# Patient Record
Sex: Male | Born: 1955 | Race: White | Hispanic: No | Marital: Single | Smoking: Former smoker
Health system: Southern US, Community
[De-identification: ages and names within clinical notes are randomized; demographics above are authoritative.]

## PROBLEM LIST (undated history)

## (undated) DIAGNOSIS — E785 Hyperlipidemia, unspecified: Secondary | ICD-10-CM

## (undated) DIAGNOSIS — L309 Dermatitis, unspecified: Secondary | ICD-10-CM

## (undated) DIAGNOSIS — D693 Immune thrombocytopenic purpura: Principal | ICD-10-CM

## (undated) DIAGNOSIS — N052 Unspecified nephritic syndrome with diffuse membranous glomerulonephritis: Secondary | ICD-10-CM

## (undated) DIAGNOSIS — N189 Chronic kidney disease, unspecified: Secondary | ICD-10-CM

## (undated) DIAGNOSIS — I10 Essential (primary) hypertension: Secondary | ICD-10-CM

## (undated) DIAGNOSIS — D649 Anemia, unspecified: Secondary | ICD-10-CM

## (undated) HISTORY — DX: Anemia, unspecified: D64.9

## (undated) HISTORY — DX: Hyperlipidemia, unspecified: E78.5

## (undated) HISTORY — DX: Chronic kidney disease, unspecified: N18.9

## (undated) HISTORY — PX: HERNIA REPAIR: SHX51

## (undated) HISTORY — DX: Essential (primary) hypertension: I10

## (undated) HISTORY — DX: Unspecified nephritic syndrome with diffuse membranous glomerulonephritis: N05.2

## (undated) HISTORY — PX: OTHER SURGICAL HISTORY: SHX169

## (undated) HISTORY — DX: Immune thrombocytopenic purpura: D69.3

---

## 2005-04-26 ENCOUNTER — Ambulatory Visit (HOSPITAL_BASED_OUTPATIENT_CLINIC_OR_DEPARTMENT_OTHER): Admission: RE | Admit: 2005-04-26 | Discharge: 2005-04-26 | Payer: Self-pay | Admitting: Surgery

## 2005-04-26 ENCOUNTER — Ambulatory Visit (HOSPITAL_COMMUNITY): Admission: RE | Admit: 2005-04-26 | Discharge: 2005-04-26 | Payer: Self-pay | Admitting: Surgery

## 2005-12-20 ENCOUNTER — Ambulatory Visit (HOSPITAL_COMMUNITY): Admission: RE | Admit: 2005-12-20 | Discharge: 2005-12-20 | Payer: Self-pay | Admitting: Surgery

## 2009-10-03 DIAGNOSIS — D693 Immune thrombocytopenic purpura: Secondary | ICD-10-CM

## 2009-10-03 HISTORY — DX: Immune thrombocytopenic purpura: D69.3

## 2010-12-01 ENCOUNTER — Other Ambulatory Visit: Payer: Self-pay | Admitting: Oncology

## 2010-12-01 ENCOUNTER — Encounter (HOSPITAL_BASED_OUTPATIENT_CLINIC_OR_DEPARTMENT_OTHER): Payer: BC Managed Care – PPO | Admitting: Oncology

## 2010-12-01 ENCOUNTER — Encounter: Payer: Self-pay | Admitting: Oncology

## 2010-12-01 DIAGNOSIS — D696 Thrombocytopenia, unspecified: Secondary | ICD-10-CM

## 2010-12-01 DIAGNOSIS — N039 Chronic nephritic syndrome with unspecified morphologic changes: Secondary | ICD-10-CM

## 2010-12-01 LAB — CBC WITH DIFFERENTIAL/PLATELET
BASO%: 0.7 % (ref 0.0–2.0)
EOS%: 0.5 % (ref 0.0–7.0)
MCH: 30.7 pg (ref 27.2–33.4)
MCHC: 34.5 g/dL (ref 32.0–36.0)
MCV: 89.1 fL (ref 79.3–98.0)
MONO%: 4.1 % (ref 0.0–14.0)
NEUT#: 4.3 10*3/uL (ref 1.5–6.5)
RBC: 3.98 10*6/uL — ABNORMAL LOW (ref 4.20–5.82)
RDW: 13.2 % (ref 11.0–14.6)

## 2010-12-01 LAB — CHCC SMEAR

## 2010-12-01 LAB — MORPHOLOGY: RBC Comments: NORMAL

## 2010-12-03 LAB — COMPREHENSIVE METABOLIC PANEL
ALT: 12 U/L (ref 0–53)
Albumin: 4 g/dL (ref 3.5–5.2)
Alkaline Phosphatase: 30 U/L — ABNORMAL LOW (ref 39–117)
CO2: 25 mEq/L (ref 19–32)
Potassium: 4.2 mEq/L (ref 3.5–5.3)
Sodium: 140 mEq/L (ref 135–145)
Total Bilirubin: 0.9 mg/dL (ref 0.3–1.2)
Total Protein: 6.2 g/dL (ref 6.0–8.3)

## 2010-12-03 LAB — FOLATE: Folate: >20 ng/mL

## 2010-12-03 LAB — PROTEIN ELECTROPHORESIS, SERUM
Beta 2: 4.7 % (ref 3.2–6.5)
Gamma Globulin: 11.6 % (ref 11.1–18.8)

## 2010-12-03 LAB — VITAMIN B12: Vitamin B-12: 387 pg/mL (ref 211–911)

## 2010-12-03 LAB — KAPPA/LAMBDA LIGHT CHAINS: Kappa free light chain: 1.13 mg/dL (ref 0.33–1.94)

## 2010-12-03 LAB — HIV ANTIBODY (ROUTINE TESTING W REFLEX): HIV: NONREACTIVE

## 2010-12-06 ENCOUNTER — Other Ambulatory Visit (HOSPITAL_COMMUNITY)
Admission: RE | Admit: 2010-12-06 | Discharge: 2010-12-06 | Disposition: A | Discharge status: Home / Self Care | Payer: BC Managed Care – PPO | Source: Ambulatory Visit | Attending: Oncology | Admitting: Oncology

## 2010-12-06 ENCOUNTER — Other Ambulatory Visit: Payer: Self-pay | Admitting: Oncology

## 2010-12-06 ENCOUNTER — Encounter (HOSPITAL_BASED_OUTPATIENT_CLINIC_OR_DEPARTMENT_OTHER): Payer: BC Managed Care – PPO | Admitting: Oncology

## 2010-12-06 DIAGNOSIS — D696 Thrombocytopenia, unspecified: Secondary | ICD-10-CM | POA: Insufficient documentation

## 2010-12-06 DIAGNOSIS — N039 Chronic nephritic syndrome with unspecified morphologic changes: Secondary | ICD-10-CM

## 2010-12-06 DIAGNOSIS — D649 Anemia, unspecified: Secondary | ICD-10-CM

## 2010-12-06 LAB — CBC WITH DIFFERENTIAL/PLATELET
Eosinophils Absolute: 0 10*3/uL (ref 0.0–0.5)
MONO#: 0.3 10*3/uL (ref 0.1–0.9)
MONO%: 4.2 % (ref 0.0–14.0)
NEUT#: 6.3 10*3/uL (ref 1.5–6.5)
RBC: 4.12 10*6/uL — ABNORMAL LOW (ref 4.20–5.82)
RDW: 13.4 % (ref 11.0–14.6)
WBC: 7.3 10*3/uL (ref 4.0–10.3)
lymph#: 0.7 10*3/uL — ABNORMAL LOW (ref 0.9–3.3)

## 2010-12-06 LAB — WHOLE BLOOD GLUCOSE
Glucose: 106 mg/dL — ABNORMAL HIGH (ref 70–100)
HRS PC: 2 Hours

## 2010-12-09 ENCOUNTER — Ambulatory Visit (HOSPITAL_COMMUNITY)
Admission: RE | Admit: 2010-12-09 | Discharge: 2010-12-09 | Disposition: A | Discharge status: Home / Self Care | Payer: BC Managed Care – PPO | Source: Ambulatory Visit | Attending: Oncology | Admitting: Oncology

## 2010-12-09 DIAGNOSIS — N049 Nephrotic syndrome with unspecified morphologic changes: Secondary | ICD-10-CM | POA: Insufficient documentation

## 2010-12-09 DIAGNOSIS — R161 Splenomegaly, not elsewhere classified: Secondary | ICD-10-CM | POA: Insufficient documentation

## 2010-12-09 DIAGNOSIS — D1803 Hemangioma of intra-abdominal structures: Secondary | ICD-10-CM | POA: Insufficient documentation

## 2010-12-09 DIAGNOSIS — I10 Essential (primary) hypertension: Secondary | ICD-10-CM | POA: Insufficient documentation

## 2010-12-09 DIAGNOSIS — D696 Thrombocytopenia, unspecified: Secondary | ICD-10-CM | POA: Insufficient documentation

## 2010-12-14 ENCOUNTER — Other Ambulatory Visit: Payer: Self-pay | Admitting: Oncology

## 2010-12-14 ENCOUNTER — Encounter (HOSPITAL_BASED_OUTPATIENT_CLINIC_OR_DEPARTMENT_OTHER): Payer: BC Managed Care – PPO | Admitting: Oncology

## 2010-12-14 DIAGNOSIS — D696 Thrombocytopenia, unspecified: Secondary | ICD-10-CM

## 2010-12-14 DIAGNOSIS — N039 Chronic nephritic syndrome with unspecified morphologic changes: Secondary | ICD-10-CM

## 2010-12-14 LAB — CBC WITH DIFFERENTIAL/PLATELET
EOS%: 0 % (ref 0.0–7.0)
Eosinophils Absolute: 0 10*3/uL (ref 0.0–0.5)
LYMPH%: 5.7 % — ABNORMAL LOW (ref 14.0–49.0)
MCH: 30.8 pg (ref 27.2–33.4)
MCV: 89.8 fL (ref 79.3–98.0)
MONO%: 2.4 % (ref 0.0–14.0)
NEUT#: 7.3 10*3/uL — ABNORMAL HIGH (ref 1.5–6.5)
Platelets: 163 10*3/uL (ref 140–400)
RBC: 4.14 10*6/uL — ABNORMAL LOW (ref 4.20–5.82)

## 2010-12-28 ENCOUNTER — Encounter (HOSPITAL_BASED_OUTPATIENT_CLINIC_OR_DEPARTMENT_OTHER): Payer: BC Managed Care – PPO | Admitting: Oncology

## 2010-12-28 ENCOUNTER — Other Ambulatory Visit: Payer: Self-pay | Admitting: Oncology

## 2010-12-28 DIAGNOSIS — D696 Thrombocytopenia, unspecified: Secondary | ICD-10-CM

## 2010-12-28 DIAGNOSIS — N039 Chronic nephritic syndrome with unspecified morphologic changes: Secondary | ICD-10-CM

## 2010-12-28 LAB — CBC WITH DIFFERENTIAL/PLATELET
BASO%: 0.1 % (ref 0.0–2.0)
LYMPH%: 10 % — ABNORMAL LOW (ref 14.0–49.0)
MCHC: 34.4 g/dL (ref 32.0–36.0)
MCV: 87.7 fL (ref 79.3–98.0)
MONO#: 0.4 10*3/uL (ref 0.1–0.9)
MONO%: 4.9 % (ref 0.0–14.0)
Platelets: 78 10*3/uL — ABNORMAL LOW (ref 140–400)
RBC: 4.14 10*6/uL — ABNORMAL LOW (ref 4.20–5.82)
WBC: 8 10*3/uL (ref 4.0–10.3)

## 2011-01-04 ENCOUNTER — Other Ambulatory Visit: Payer: Self-pay | Admitting: Oncology

## 2011-01-04 ENCOUNTER — Encounter (HOSPITAL_BASED_OUTPATIENT_CLINIC_OR_DEPARTMENT_OTHER): Payer: BC Managed Care – PPO | Admitting: Oncology

## 2011-01-04 DIAGNOSIS — D696 Thrombocytopenia, unspecified: Secondary | ICD-10-CM

## 2011-01-04 LAB — CBC WITH DIFFERENTIAL/PLATELET
BASO%: 0 % (ref 0.0–2.0)
MCHC: 34.7 g/dL (ref 32.0–36.0)
MONO#: 0.1 10*3/uL (ref 0.1–0.9)
RBC: 4.41 10*6/uL (ref 4.20–5.82)
WBC: 7.9 10*3/uL (ref 4.0–10.3)
lymph#: 0.3 10*3/uL — ABNORMAL LOW (ref 0.9–3.3)
nRBC: 0 % (ref 0–0)

## 2011-01-04 LAB — BASIC METABOLIC PANEL
Chloride: 104 mEq/L (ref 96–112)
Potassium: 4.3 mEq/L (ref 3.5–5.3)

## 2011-01-11 ENCOUNTER — Other Ambulatory Visit: Payer: Self-pay | Admitting: Oncology

## 2011-01-11 ENCOUNTER — Encounter (HOSPITAL_BASED_OUTPATIENT_CLINIC_OR_DEPARTMENT_OTHER): Payer: BC Managed Care – PPO | Admitting: Oncology

## 2011-01-11 DIAGNOSIS — D696 Thrombocytopenia, unspecified: Secondary | ICD-10-CM

## 2011-01-11 DIAGNOSIS — D649 Anemia, unspecified: Secondary | ICD-10-CM

## 2011-01-11 LAB — CBC WITH DIFFERENTIAL/PLATELET
Basophils Absolute: 0 10*3/uL (ref 0.0–0.1)
Eosinophils Absolute: 0 10*3/uL (ref 0.0–0.5)
HGB: 12.4 g/dL — ABNORMAL LOW (ref 13.0–17.1)
LYMPH%: 5.8 % — ABNORMAL LOW (ref 14.0–49.0)
MCV: 90.9 fL (ref 79.3–98.0)
MONO%: 3.9 % (ref 0.0–14.0)
NEUT#: 7.2 10*3/uL — ABNORMAL HIGH (ref 1.5–6.5)
Platelets: 131 10*3/uL — ABNORMAL LOW (ref 140–400)
RDW: 13.7 % (ref 11.0–14.6)

## 2011-01-18 ENCOUNTER — Other Ambulatory Visit: Payer: Self-pay | Admitting: Oncology

## 2011-01-18 ENCOUNTER — Encounter (HOSPITAL_BASED_OUTPATIENT_CLINIC_OR_DEPARTMENT_OTHER): Payer: BC Managed Care – PPO | Admitting: Oncology

## 2011-01-18 DIAGNOSIS — D649 Anemia, unspecified: Secondary | ICD-10-CM

## 2011-01-18 DIAGNOSIS — D696 Thrombocytopenia, unspecified: Secondary | ICD-10-CM

## 2011-01-18 LAB — CBC WITH DIFFERENTIAL/PLATELET
Basophils Absolute: 0 10*3/uL (ref 0.0–0.1)
Eosinophils Absolute: 0 10*3/uL (ref 0.0–0.5)
HCT: 36.5 % — ABNORMAL LOW (ref 38.4–49.9)
LYMPH%: 4.5 % — ABNORMAL LOW (ref 14.0–49.0)
MCV: 91.3 fL (ref 79.3–98.0)
MONO%: 4 % (ref 0.0–14.0)
NEUT#: 7.5 10*3/uL — ABNORMAL HIGH (ref 1.5–6.5)
NEUT%: 91.5 % — ABNORMAL HIGH (ref 39.0–75.0)
Platelets: 142 10*3/uL (ref 140–400)
RBC: 4 10*6/uL — ABNORMAL LOW (ref 4.20–5.82)

## 2011-01-25 ENCOUNTER — Encounter (HOSPITAL_BASED_OUTPATIENT_CLINIC_OR_DEPARTMENT_OTHER): Payer: BC Managed Care – PPO | Admitting: Oncology

## 2011-01-25 ENCOUNTER — Other Ambulatory Visit: Payer: Self-pay | Admitting: Oncology

## 2011-01-25 DIAGNOSIS — D696 Thrombocytopenia, unspecified: Secondary | ICD-10-CM

## 2011-01-25 DIAGNOSIS — N039 Chronic nephritic syndrome with unspecified morphologic changes: Secondary | ICD-10-CM

## 2011-01-25 LAB — CBC WITH DIFFERENTIAL/PLATELET
BASO%: 0.2 % (ref 0.0–2.0)
EOS%: 0 % (ref 0.0–7.0)
LYMPH%: 6.4 % — ABNORMAL LOW (ref 14.0–49.0)
MCH: 31.1 pg (ref 27.2–33.4)
MCHC: 34.1 g/dL (ref 32.0–36.0)
MCV: 91.2 fL (ref 79.3–98.0)
MONO%: 4.7 % (ref 0.0–14.0)
NEUT%: 88.7 % — ABNORMAL HIGH (ref 39.0–75.0)
Platelets: 117 10*3/uL — ABNORMAL LOW (ref 140–400)
RBC: 3.99 10*6/uL — ABNORMAL LOW (ref 4.20–5.82)
WBC: 7.7 10*3/uL (ref 4.0–10.3)

## 2011-02-02 ENCOUNTER — Encounter (HOSPITAL_BASED_OUTPATIENT_CLINIC_OR_DEPARTMENT_OTHER): Payer: BC Managed Care – PPO | Admitting: Oncology

## 2011-02-02 ENCOUNTER — Other Ambulatory Visit: Payer: Self-pay | Admitting: Oncology

## 2011-02-02 DIAGNOSIS — D696 Thrombocytopenia, unspecified: Secondary | ICD-10-CM

## 2011-02-02 DIAGNOSIS — D649 Anemia, unspecified: Secondary | ICD-10-CM

## 2011-02-02 LAB — CBC WITH DIFFERENTIAL/PLATELET
BASO%: 0 % (ref 0.0–2.0)
EOS%: 0 % (ref 0.0–7.0)
MCH: 31.3 pg (ref 27.2–33.4)
MCHC: 34.4 g/dL (ref 32.0–36.0)
MONO#: 0.3 10*3/uL (ref 0.1–0.9)
RBC: 4.03 10*6/uL — ABNORMAL LOW (ref 4.20–5.82)
WBC: 7.5 10*3/uL (ref 4.0–10.3)
lymph#: 0.4 10*3/uL — ABNORMAL LOW (ref 0.9–3.3)

## 2011-02-15 ENCOUNTER — Encounter (HOSPITAL_BASED_OUTPATIENT_CLINIC_OR_DEPARTMENT_OTHER): Payer: BC Managed Care – PPO | Admitting: Oncology

## 2011-02-15 ENCOUNTER — Other Ambulatory Visit: Payer: Self-pay | Admitting: Oncology

## 2011-02-15 DIAGNOSIS — D696 Thrombocytopenia, unspecified: Secondary | ICD-10-CM

## 2011-02-15 DIAGNOSIS — N039 Chronic nephritic syndrome with unspecified morphologic changes: Secondary | ICD-10-CM

## 2011-02-15 LAB — CBC WITH DIFFERENTIAL/PLATELET
BASO%: 0 % (ref 0.0–2.0)
Basophils Absolute: 0 10*3/uL (ref 0.0–0.1)
EOS%: 0.1 % (ref 0.0–7.0)
HCT: 35.5 % — ABNORMAL LOW (ref 38.4–49.9)
HGB: 12.2 g/dL — ABNORMAL LOW (ref 13.0–17.1)
MCH: 31.2 pg (ref 27.2–33.4)
MCHC: 34.4 g/dL (ref 32.0–36.0)
MONO#: 0.4 10*3/uL (ref 0.1–0.9)
NEUT%: 89.8 % — ABNORMAL HIGH (ref 39.0–75.0)
RDW: 14.4 % (ref 11.0–14.6)
WBC: 8.7 10*3/uL (ref 4.0–10.3)
lymph#: 0.5 10*3/uL — ABNORMAL LOW (ref 0.9–3.3)

## 2011-03-02 ENCOUNTER — Encounter (HOSPITAL_BASED_OUTPATIENT_CLINIC_OR_DEPARTMENT_OTHER): Payer: BC Managed Care – PPO | Admitting: Oncology

## 2011-03-02 ENCOUNTER — Other Ambulatory Visit: Payer: Self-pay | Admitting: Oncology

## 2011-03-02 DIAGNOSIS — D696 Thrombocytopenia, unspecified: Secondary | ICD-10-CM

## 2011-03-02 DIAGNOSIS — D649 Anemia, unspecified: Secondary | ICD-10-CM

## 2011-03-02 LAB — CBC WITH DIFFERENTIAL/PLATELET
Basophils Absolute: 0 10*3/uL (ref 0.0–0.1)
Eosinophils Absolute: 0 10*3/uL (ref 0.0–0.5)
HCT: 35.8 % — ABNORMAL LOW (ref 38.4–49.9)
HGB: 12.4 g/dL — ABNORMAL LOW (ref 13.0–17.1)
MCH: 31.7 pg (ref 27.2–33.4)
MONO#: 0.4 10*3/uL (ref 0.1–0.9)
NEUT#: 6.4 10*3/uL (ref 1.5–6.5)
NEUT%: 86.8 % — ABNORMAL HIGH (ref 39.0–75.0)
RDW: 14.3 % (ref 11.0–14.6)
lymph#: 0.6 10*3/uL — ABNORMAL LOW (ref 0.9–3.3)

## 2011-03-15 ENCOUNTER — Other Ambulatory Visit: Payer: Self-pay | Admitting: Oncology

## 2011-03-15 ENCOUNTER — Encounter (HOSPITAL_BASED_OUTPATIENT_CLINIC_OR_DEPARTMENT_OTHER): Payer: BC Managed Care – PPO | Admitting: Oncology

## 2011-03-15 DIAGNOSIS — D649 Anemia, unspecified: Secondary | ICD-10-CM

## 2011-03-15 DIAGNOSIS — D696 Thrombocytopenia, unspecified: Secondary | ICD-10-CM

## 2011-03-15 DIAGNOSIS — D693 Immune thrombocytopenic purpura: Secondary | ICD-10-CM

## 2011-03-15 LAB — COMPREHENSIVE METABOLIC PANEL
AST: 11 U/L (ref 0–37)
Albumin: 3.2 g/dL — ABNORMAL LOW (ref 3.5–5.2)
Alkaline Phosphatase: 25 U/L — ABNORMAL LOW (ref 39–117)
BUN: 33 mg/dL — ABNORMAL HIGH (ref 6–23)
Creatinine, Ser: 0.98 mg/dL (ref 0.50–1.35)
Glucose, Bld: 102 mg/dL — ABNORMAL HIGH (ref 70–99)
Potassium: 4.5 mEq/L (ref 3.5–5.3)

## 2011-03-15 LAB — CBC WITH DIFFERENTIAL/PLATELET
Basophils Absolute: 0 10*3/uL (ref 0.0–0.1)
EOS%: 0.2 % (ref 0.0–7.0)
Eosinophils Absolute: 0 10*3/uL (ref 0.0–0.5)
HCT: 35.1 % — ABNORMAL LOW (ref 38.4–49.9)
HGB: 12.1 g/dL — ABNORMAL LOW (ref 13.0–17.1)
LYMPH%: 10.2 % — ABNORMAL LOW (ref 14.0–49.0)
MCH: 31.3 pg (ref 27.2–33.4)
MCV: 90.7 fL (ref 79.3–98.0)
MONO%: 4.9 % (ref 0.0–14.0)
NEUT#: 5.6 10*3/uL (ref 1.5–6.5)
NEUT%: 84.4 % — ABNORMAL HIGH (ref 39.0–75.0)
Platelets: 150 10*3/uL (ref 140–400)
RDW: 14.1 % (ref 11.0–14.6)

## 2011-03-31 ENCOUNTER — Encounter (HOSPITAL_BASED_OUTPATIENT_CLINIC_OR_DEPARTMENT_OTHER): Payer: BC Managed Care – PPO | Admitting: Oncology

## 2011-03-31 ENCOUNTER — Other Ambulatory Visit: Payer: Self-pay | Admitting: Medical

## 2011-03-31 DIAGNOSIS — D649 Anemia, unspecified: Secondary | ICD-10-CM

## 2011-03-31 DIAGNOSIS — D696 Thrombocytopenia, unspecified: Secondary | ICD-10-CM

## 2011-03-31 LAB — CBC WITH DIFFERENTIAL/PLATELET
Basophils Absolute: 0 10*3/uL (ref 0.0–0.1)
Eosinophils Absolute: 0 10*3/uL (ref 0.0–0.5)
HCT: 37.6 % — ABNORMAL LOW (ref 38.4–49.9)
HGB: 13 g/dL (ref 13.0–17.1)
MCV: 91.3 fL (ref 79.3–98.0)
MONO%: 3.5 % (ref 0.0–14.0)
NEUT#: 6.4 10*3/uL (ref 1.5–6.5)
NEUT%: 89.5 % — ABNORMAL HIGH (ref 39.0–75.0)
RDW: 13.5 % (ref 11.0–14.6)
lymph#: 0.5 10*3/uL — ABNORMAL LOW (ref 0.9–3.3)

## 2011-04-14 ENCOUNTER — Encounter (HOSPITAL_BASED_OUTPATIENT_CLINIC_OR_DEPARTMENT_OTHER): Payer: BC Managed Care – PPO | Admitting: Oncology

## 2011-04-14 ENCOUNTER — Other Ambulatory Visit: Payer: Self-pay | Admitting: Medical

## 2011-04-14 DIAGNOSIS — D649 Anemia, unspecified: Secondary | ICD-10-CM

## 2011-04-14 DIAGNOSIS — D696 Thrombocytopenia, unspecified: Secondary | ICD-10-CM

## 2011-04-14 LAB — CBC WITH DIFFERENTIAL/PLATELET
Basophils Absolute: 0 10*3/uL (ref 0.0–0.1)
Eosinophils Absolute: 0 10*3/uL (ref 0.0–0.5)
HGB: 11.7 g/dL — ABNORMAL LOW (ref 13.0–17.1)
LYMPH%: 15.7 % (ref 14.0–49.0)
MCH: 32.3 pg (ref 27.2–33.4)
MCV: 90.5 fL (ref 79.3–98.0)
MONO%: 7.7 % (ref 0.0–14.0)
NEUT#: 4.3 10*3/uL (ref 1.5–6.5)
Platelets: 120 10*3/uL — ABNORMAL LOW (ref 140–400)

## 2011-05-04 ENCOUNTER — Encounter (HOSPITAL_BASED_OUTPATIENT_CLINIC_OR_DEPARTMENT_OTHER): Payer: BC Managed Care – PPO | Admitting: Oncology

## 2011-05-04 ENCOUNTER — Other Ambulatory Visit: Payer: Self-pay | Admitting: Oncology

## 2011-05-04 DIAGNOSIS — N039 Chronic nephritic syndrome with unspecified morphologic changes: Secondary | ICD-10-CM

## 2011-05-04 DIAGNOSIS — D696 Thrombocytopenia, unspecified: Secondary | ICD-10-CM

## 2011-05-04 LAB — CBC WITH DIFFERENTIAL/PLATELET
BASO%: 0.4 % (ref 0.0–2.0)
EOS%: 0.6 % (ref 0.0–7.0)
Eosinophils Absolute: 0 10*3/uL (ref 0.0–0.5)
LYMPH%: 13.8 % — ABNORMAL LOW (ref 14.0–49.0)
MCH: 30.4 pg (ref 27.2–33.4)
MCHC: 34.6 g/dL (ref 32.0–36.0)
MCV: 87.9 fL (ref 79.3–98.0)
MONO%: 5.9 % (ref 0.0–14.0)
Platelets: 160 10*3/uL (ref 140–400)
RBC: 3.81 10*6/uL — ABNORMAL LOW (ref 4.20–5.82)
nRBC: 0 % (ref 0–0)

## 2011-05-18 ENCOUNTER — Encounter (HOSPITAL_BASED_OUTPATIENT_CLINIC_OR_DEPARTMENT_OTHER): Payer: BC Managed Care – PPO | Admitting: Oncology

## 2011-05-18 ENCOUNTER — Other Ambulatory Visit: Payer: Self-pay | Admitting: Oncology

## 2011-05-18 DIAGNOSIS — D693 Immune thrombocytopenic purpura: Secondary | ICD-10-CM

## 2011-05-18 LAB — CBC WITH DIFFERENTIAL/PLATELET
BASO%: 0.6 % (ref 0.0–2.0)
EOS%: 0.5 % (ref 0.0–7.0)
HCT: 33.5 % — ABNORMAL LOW (ref 38.4–49.9)
MCH: 31.4 pg (ref 27.2–33.4)
MCHC: 35 g/dL (ref 32.0–36.0)
NEUT%: 78.7 % — ABNORMAL HIGH (ref 39.0–75.0)
lymph#: 0.7 10*3/uL — ABNORMAL LOW (ref 0.9–3.3)

## 2011-06-08 ENCOUNTER — Encounter (HOSPITAL_BASED_OUTPATIENT_CLINIC_OR_DEPARTMENT_OTHER): Payer: BC Managed Care – PPO | Admitting: Oncology

## 2011-06-08 ENCOUNTER — Other Ambulatory Visit: Payer: Self-pay | Admitting: Oncology

## 2011-06-08 DIAGNOSIS — D693 Immune thrombocytopenic purpura: Secondary | ICD-10-CM

## 2011-06-08 LAB — CBC WITH DIFFERENTIAL/PLATELET
BASO%: 0.5 % (ref 0.0–2.0)
Basophils Absolute: 0 10*3/uL (ref 0.0–0.1)
EOS%: 1 % (ref 0.0–7.0)
HGB: 12.3 g/dL — ABNORMAL LOW (ref 13.0–17.1)
MCH: 30.2 pg (ref 27.2–33.4)
MCHC: 34.9 g/dL (ref 32.0–36.0)
RDW: 12.8 % (ref 11.0–14.6)
lymph#: 1.1 10*3/uL (ref 0.9–3.3)

## 2011-06-27 ENCOUNTER — Encounter (HOSPITAL_BASED_OUTPATIENT_CLINIC_OR_DEPARTMENT_OTHER): Payer: BC Managed Care – PPO | Admitting: Oncology

## 2011-06-27 ENCOUNTER — Other Ambulatory Visit: Payer: Self-pay | Admitting: Oncology

## 2011-06-27 DIAGNOSIS — D696 Thrombocytopenia, unspecified: Secondary | ICD-10-CM

## 2011-06-27 DIAGNOSIS — N039 Chronic nephritic syndrome with unspecified morphologic changes: Secondary | ICD-10-CM

## 2011-06-27 LAB — CBC WITH DIFFERENTIAL/PLATELET
Basophils Absolute: 0 10*3/uL (ref 0.0–0.1)
Eosinophils Absolute: 0 10*3/uL (ref 0.0–0.5)
HGB: 12.7 g/dL — ABNORMAL LOW (ref 13.0–17.1)
MONO#: 0.5 10*3/uL (ref 0.1–0.9)
NEUT#: 4.3 10*3/uL (ref 1.5–6.5)
RDW: 13.2 % (ref 11.0–14.6)
WBC: 6 10*3/uL (ref 4.0–10.3)
lymph#: 1.1 10*3/uL (ref 0.9–3.3)

## 2011-06-27 LAB — COMPREHENSIVE METABOLIC PANEL
Albumin: 3.5 g/dL (ref 3.5–5.2)
BUN: 28 mg/dL — ABNORMAL HIGH (ref 6–23)
Calcium: 9.8 mg/dL (ref 8.4–10.5)
Chloride: 102 mEq/L (ref 96–112)
Glucose, Bld: 99 mg/dL (ref 70–99)
Potassium: 4.1 mEq/L (ref 3.5–5.3)
Total Protein: 6.7 g/dL (ref 6.0–8.3)

## 2011-07-06 ENCOUNTER — Other Ambulatory Visit: Payer: Self-pay | Admitting: Oncology

## 2011-07-06 ENCOUNTER — Encounter (HOSPITAL_BASED_OUTPATIENT_CLINIC_OR_DEPARTMENT_OTHER): Payer: BC Managed Care – PPO | Admitting: Oncology

## 2011-07-06 DIAGNOSIS — D693 Immune thrombocytopenic purpura: Secondary | ICD-10-CM

## 2011-07-06 DIAGNOSIS — N189 Chronic kidney disease, unspecified: Secondary | ICD-10-CM

## 2011-07-06 LAB — CBC WITH DIFFERENTIAL/PLATELET
Basophils Absolute: 0 10*3/uL (ref 0.0–0.1)
EOS%: 1.1 % (ref 0.0–7.0)
HGB: 12.1 g/dL — ABNORMAL LOW (ref 13.0–17.1)
MCH: 30.8 pg (ref 27.2–33.4)
NEUT#: 4 10*3/uL (ref 1.5–6.5)
RBC: 3.93 10*6/uL — ABNORMAL LOW (ref 4.20–5.82)
RDW: 13.2 % (ref 11.0–14.6)
lymph#: 1 10*3/uL (ref 0.9–3.3)

## 2011-07-14 ENCOUNTER — Encounter: Payer: Self-pay | Admitting: Oncology

## 2011-07-14 DIAGNOSIS — D693 Immune thrombocytopenic purpura: Secondary | ICD-10-CM | POA: Insufficient documentation

## 2011-07-20 ENCOUNTER — Other Ambulatory Visit: Payer: Self-pay | Admitting: Oncology

## 2011-07-20 ENCOUNTER — Encounter (HOSPITAL_BASED_OUTPATIENT_CLINIC_OR_DEPARTMENT_OTHER): Payer: BC Managed Care – PPO | Admitting: Oncology

## 2011-07-20 DIAGNOSIS — D693 Immune thrombocytopenic purpura: Secondary | ICD-10-CM

## 2011-07-20 LAB — CBC WITH DIFFERENTIAL/PLATELET
Basophils Absolute: 0 10*3/uL (ref 0.0–0.1)
Eosinophils Absolute: 0.1 10*3/uL (ref 0.0–0.5)
HGB: 12.3 g/dL — ABNORMAL LOW (ref 13.0–17.1)
MCV: 89.1 fL (ref 79.3–98.0)
MONO#: 0.4 10*3/uL (ref 0.1–0.9)
MONO%: 7.3 % (ref 0.0–14.0)
NEUT#: 3.3 10*3/uL (ref 1.5–6.5)
RBC: 3.92 10*6/uL — ABNORMAL LOW (ref 4.20–5.82)
RDW: 13.5 % (ref 11.0–14.6)
WBC: 4.8 10*3/uL (ref 4.0–10.3)
lymph#: 1 10*3/uL (ref 0.9–3.3)

## 2011-07-20 LAB — LIPID PANEL
Cholesterol: 161 mg/dL (ref 0–200)
HDL: 41 mg/dL (ref >39)
LDL Cholesterol: 94 mg/dL (ref 0–99)
Total CHOL/HDL Ratio: 3.9 Ratio
Triglycerides: 128 mg/dL (ref <150)
VLDL: 26 mg/dL (ref 0–40)

## 2011-08-03 ENCOUNTER — Encounter (HOSPITAL_BASED_OUTPATIENT_CLINIC_OR_DEPARTMENT_OTHER): Payer: BC Managed Care – PPO | Admitting: Oncology

## 2011-08-03 ENCOUNTER — Other Ambulatory Visit: Payer: Self-pay | Admitting: Oncology

## 2011-08-03 DIAGNOSIS — D693 Immune thrombocytopenic purpura: Secondary | ICD-10-CM

## 2011-08-03 LAB — CBC WITH DIFFERENTIAL/PLATELET
BASO%: 0.8 % (ref 0.0–2.0)
EOS%: 0.9 % (ref 0.0–7.0)
MCH: 30.7 pg (ref 27.2–33.4)
MCHC: 34.2 g/dL (ref 32.0–36.0)
MCV: 89.7 fL (ref 79.3–98.0)
MONO%: 7.9 % (ref 0.0–14.0)
RBC: 3.8 10*6/uL — ABNORMAL LOW (ref 4.20–5.82)
RDW: 13.1 % (ref 11.0–14.6)
lymph#: 1.1 10*3/uL (ref 0.9–3.3)

## 2011-08-17 ENCOUNTER — Other Ambulatory Visit (HOSPITAL_BASED_OUTPATIENT_CLINIC_OR_DEPARTMENT_OTHER): Payer: BC Managed Care – PPO | Admitting: Lab

## 2011-08-17 ENCOUNTER — Other Ambulatory Visit: Payer: Self-pay | Admitting: Oncology

## 2011-08-17 DIAGNOSIS — D693 Immune thrombocytopenic purpura: Secondary | ICD-10-CM

## 2011-08-17 LAB — CBC WITH DIFFERENTIAL/PLATELET
Basophils Absolute: 0 10*3/uL (ref 0.0–0.1)
EOS%: 0.9 % (ref 0.0–7.0)
Eosinophils Absolute: 0.1 10*3/uL (ref 0.0–0.5)
HGB: 12.6 g/dL — ABNORMAL LOW (ref 13.0–17.1)
MCV: 89.9 fL (ref 79.3–98.0)
MONO%: 7.9 % (ref 0.0–14.0)
NEUT#: 4.3 10*3/uL (ref 1.5–6.5)
RBC: 4.07 10*6/uL — ABNORMAL LOW (ref 4.20–5.82)
RDW: 13.3 % (ref 11.0–14.6)
lymph#: 1 10*3/uL (ref 0.9–3.3)

## 2011-08-18 ENCOUNTER — Telehealth: Payer: Self-pay | Admitting: *Deleted

## 2011-08-18 NOTE — Telephone Encounter (Signed)
Called pt to inform him of Platelet count and to continue same dose of prednisone per Dr. Lodema Pilot note below.  Pt confirms he is currently on 5mg  daily and will continue this. He has appt for lab again in 2 weeks.

## 2011-08-18 NOTE — Telephone Encounter (Signed)
Message copied by Wende Mott on Thu Aug 18, 2011  8:58 AM ------      Message from: HA, Raliegh Ip T      Created: Wed Aug 17, 2011  5:07 PM       Please call pt.  No change in dose of Prednisone.  Thanks.

## 2011-08-31 ENCOUNTER — Other Ambulatory Visit (HOSPITAL_BASED_OUTPATIENT_CLINIC_OR_DEPARTMENT_OTHER): Payer: BC Managed Care – PPO | Admitting: Lab

## 2011-08-31 ENCOUNTER — Other Ambulatory Visit: Payer: Self-pay | Admitting: Oncology

## 2011-08-31 DIAGNOSIS — D693 Immune thrombocytopenic purpura: Secondary | ICD-10-CM

## 2011-08-31 LAB — CBC WITH DIFFERENTIAL/PLATELET
Basophils Absolute: 0 10*3/uL (ref 0.0–0.1)
Eosinophils Absolute: 0.1 10*3/uL (ref 0.0–0.5)
HGB: 12 g/dL — ABNORMAL LOW (ref 13.0–17.1)
MCV: 90 fL (ref 79.3–98.0)
MONO#: 0.5 10*3/uL (ref 0.1–0.9)
NEUT#: 4.3 10*3/uL (ref 1.5–6.5)
RBC: 3.85 10*6/uL — ABNORMAL LOW (ref 4.20–5.82)
RDW: 13.7 % (ref 11.0–14.6)
WBC: 5.9 10*3/uL (ref 4.0–10.3)

## 2011-09-01 ENCOUNTER — Telehealth: Payer: Self-pay | Admitting: *Deleted

## 2011-09-01 NOTE — Telephone Encounter (Signed)
Called pt to inform of Platelet count was 130 yesterday and to continue to take 5mg  Prednisone daily.  No change per Dr. Gaylyn Rong. Instructed to keep next lab appt as scheduled in 2 weeks.  Pt verbalized understanding.

## 2011-09-14 ENCOUNTER — Other Ambulatory Visit: Payer: Self-pay | Admitting: Oncology

## 2011-09-14 ENCOUNTER — Other Ambulatory Visit (HOSPITAL_BASED_OUTPATIENT_CLINIC_OR_DEPARTMENT_OTHER): Payer: BC Managed Care – PPO | Admitting: Lab

## 2011-09-14 DIAGNOSIS — D693 Immune thrombocytopenic purpura: Secondary | ICD-10-CM

## 2011-09-14 LAB — CBC WITH DIFFERENTIAL/PLATELET
Basophils Absolute: 0 10*3/uL (ref 0.0–0.1)
Eosinophils Absolute: 0 10*3/uL (ref 0.0–0.5)
HCT: 35.4 % — ABNORMAL LOW (ref 38.4–49.9)
HGB: 12.1 g/dL — ABNORMAL LOW (ref 13.0–17.1)
LYMPH%: 19 % (ref 14.0–49.0)
MCV: 89.5 fL (ref 79.3–98.0)
MONO#: 0.4 10*3/uL (ref 0.1–0.9)
MONO%: 7.1 % (ref 0.0–14.0)
NEUT#: 4.4 10*3/uL (ref 1.5–6.5)
NEUT%: 72.8 % (ref 39.0–75.0)
Platelets: 141 10*3/uL (ref 140–400)
WBC: 6 10*3/uL (ref 4.0–10.3)

## 2011-09-15 ENCOUNTER — Other Ambulatory Visit: Payer: Self-pay | Admitting: *Deleted

## 2011-09-15 ENCOUNTER — Telehealth: Payer: Self-pay | Admitting: *Deleted

## 2011-09-15 NOTE — Telephone Encounter (Signed)
Pt called for results of CBC.  Platelets 141.  Per Dr. Gaylyn Rong decrease Prednisone by 5 mg daily.  Pt currently on 5 mg daily and states his Kidney Doctor at [facility] wants him to continue on 5 mg daily and not d/c the prednisone.  Instructed him to continue current dose per his Kidney Doctor and I will call him back if Dr. Gaylyn Rong has any different instructions.  Pt also needs to r/s next lab from 12/26 to 12/27.  Sent POF to scheduling to r/s lab.

## 2011-09-28 ENCOUNTER — Other Ambulatory Visit: Payer: BC Managed Care – PPO | Admitting: Lab

## 2011-09-29 ENCOUNTER — Other Ambulatory Visit: Payer: Self-pay | Admitting: Oncology

## 2011-09-29 ENCOUNTER — Other Ambulatory Visit (HOSPITAL_BASED_OUTPATIENT_CLINIC_OR_DEPARTMENT_OTHER): Payer: BC Managed Care – PPO | Admitting: Lab

## 2011-09-29 ENCOUNTER — Telehealth: Payer: Self-pay | Admitting: Oncology

## 2011-09-29 DIAGNOSIS — D693 Immune thrombocytopenic purpura: Secondary | ICD-10-CM

## 2011-09-29 LAB — CBC WITH DIFFERENTIAL/PLATELET
BASO%: 0.5 % (ref 0.0–2.0)
HCT: 36.6 % — ABNORMAL LOW (ref 38.4–49.9)
LYMPH%: 17.7 % (ref 14.0–49.0)
MCH: 31.1 pg (ref 27.2–33.4)
MCHC: 34.3 g/dL (ref 32.0–36.0)
MCV: 90.5 fL (ref 79.3–98.0)
MONO#: 0.5 10*3/uL (ref 0.1–0.9)
MONO%: 7.6 % (ref 0.0–14.0)
NEUT%: 72.9 % (ref 39.0–75.0)
Platelets: 134 10*3/uL — ABNORMAL LOW (ref 140–400)
WBC: 6 10*3/uL (ref 4.0–10.3)

## 2011-09-29 NOTE — Telephone Encounter (Signed)
lmonvm adviisng the pt of his feb and April 2013 appts

## 2011-10-03 ENCOUNTER — Telehealth: Payer: Self-pay | Admitting: *Deleted

## 2011-10-03 NOTE — Telephone Encounter (Signed)
Called pt back and informed per Dr. Gaylyn Rong his plats have been relatively stable and ok to wait 2 months for next lab.  Pt verbalized understanding.

## 2011-10-03 NOTE — Telephone Encounter (Signed)
Called pt w/ results of Platelet count and to continue Prednisone 5 mg daily.  Next lab in 2 months on 11/28/11.   Pt questioned if this is correct?  Note forwarded to Dr. Gaylyn Rong to confirm pt does not need lab in January?

## 2011-10-03 NOTE — Telephone Encounter (Signed)
Message copied by Wende Mott on Mon Oct 03, 2011 11:45 AM ------      Message from: HA, Raliegh Ip T      Created: Thu Sep 29, 2011  3:46 PM       Please call pt.  His plt is stable.  Continue with Prednisone at current dose (5mg ?).   Scheduler will call him with further appointment.  Thanks.

## 2011-10-03 NOTE — Telephone Encounter (Signed)
Message copied by Wende Mott on Mon Oct 03, 2011 12:31 PM ------      Message from: HA, Raliegh Ip T      Created: Thu Sep 29, 2011  3:46 PM       Please call pt.  His plt is stable.  Continue with Prednisone at current dose (5mg ?).   Scheduler will call him with further appointment.  Thanks.

## 2011-10-03 NOTE — Telephone Encounter (Signed)
No need for monthly lab since his plt has been relatively stable.

## 2011-11-25 NOTE — Progress Notes (Signed)
Received lab results from [facility]; forwarded to Dr. Ha. 

## 2011-11-28 ENCOUNTER — Other Ambulatory Visit (HOSPITAL_BASED_OUTPATIENT_CLINIC_OR_DEPARTMENT_OTHER): Payer: BC Managed Care – PPO | Admitting: Lab

## 2011-11-28 DIAGNOSIS — D693 Immune thrombocytopenic purpura: Secondary | ICD-10-CM

## 2011-11-28 LAB — CBC WITH DIFFERENTIAL/PLATELET
BASO%: 0.3 % (ref 0.0–2.0)
EOS%: 0.7 % (ref 0.0–7.0)
LYMPH%: 6.5 % — ABNORMAL LOW (ref 14.0–49.0)
MCHC: 34.1 g/dL (ref 32.0–36.0)
MONO#: 0.4 10*3/uL (ref 0.1–0.9)
Platelets: 151 10*3/uL (ref 140–400)
RBC: 4.21 10*6/uL (ref 4.20–5.82)
WBC: 9.3 10*3/uL (ref 4.0–10.3)
lymph#: 0.6 10*3/uL — ABNORMAL LOW (ref 0.9–3.3)

## 2011-11-29 ENCOUNTER — Other Ambulatory Visit: Payer: Self-pay | Admitting: *Deleted

## 2011-11-29 ENCOUNTER — Telehealth: Payer: Self-pay | Admitting: *Deleted

## 2011-11-29 NOTE — Telephone Encounter (Signed)
Message copied by Wende Mott on Tue Nov 29, 2011  9:40 AM ------      Message from: HA, Raliegh Ip T      Created: Mon Nov 28, 2011  2:23 PM       Please call pt.  His plt is stable.  Continue low dose Prednisone.  No change in dose.  Thanks.

## 2011-11-29 NOTE — Telephone Encounter (Signed)
Called pt and informed of Platelet count normal and to continue Prednisone 5 mg daily as prescribed by his PCP.   Keep next appt as scheduled.  Pt states needs to r/s to later in day.  Instructed him to call [facility] and ask for scheduling to r/s the appt.Thomas Nielsen

## 2011-12-01 ENCOUNTER — Telehealth: Payer: Self-pay | Admitting: Oncology

## 2011-12-01 NOTE — Telephone Encounter (Signed)
called pt lmovm that his time on 04/26 was moved to start at 2:30pm.  asked pt to rtn call to confirm appt

## 2012-01-27 ENCOUNTER — Encounter: Payer: Self-pay | Admitting: Oncology

## 2012-01-27 ENCOUNTER — Ambulatory Visit (HOSPITAL_BASED_OUTPATIENT_CLINIC_OR_DEPARTMENT_OTHER): Payer: BC Managed Care – PPO | Admitting: Oncology

## 2012-01-27 ENCOUNTER — Ambulatory Visit: Payer: BC Managed Care – PPO | Admitting: Oncology

## 2012-01-27 ENCOUNTER — Other Ambulatory Visit: Payer: BC Managed Care – PPO

## 2012-01-27 ENCOUNTER — Other Ambulatory Visit (HOSPITAL_BASED_OUTPATIENT_CLINIC_OR_DEPARTMENT_OTHER): Payer: BC Managed Care – PPO | Admitting: Lab

## 2012-01-27 ENCOUNTER — Telehealth: Payer: Self-pay | Admitting: Oncology

## 2012-01-27 VITALS — BP 123/82 | HR 73 | Temp 97.0°F | Ht 70.0 in | Wt 172.9 lb

## 2012-01-27 DIAGNOSIS — N052 Unspecified nephritic syndrome with diffuse membranous glomerulonephritis: Secondary | ICD-10-CM

## 2012-01-27 DIAGNOSIS — D693 Immune thrombocytopenic purpura: Secondary | ICD-10-CM

## 2012-01-27 DIAGNOSIS — E785 Hyperlipidemia, unspecified: Secondary | ICD-10-CM | POA: Insufficient documentation

## 2012-01-27 DIAGNOSIS — I10 Essential (primary) hypertension: Secondary | ICD-10-CM

## 2012-01-27 DIAGNOSIS — D649 Anemia, unspecified: Secondary | ICD-10-CM

## 2012-01-27 HISTORY — DX: Unspecified nephritic syndrome with diffuse membranous glomerulonephritis: N05.2

## 2012-01-27 LAB — CBC WITH DIFFERENTIAL/PLATELET
BASO%: 1 % (ref 0.0–2.0)
HCT: 34.9 % — ABNORMAL LOW (ref 38.4–49.9)
MCHC: 34.3 g/dL (ref 32.0–36.0)
MONO#: 0.4 10*3/uL (ref 0.1–0.9)
NEUT%: 74.2 % (ref 39.0–75.0)
RDW: 13.4 % (ref 11.0–14.6)
WBC: 5.6 10*3/uL (ref 4.0–10.3)
lymph#: 0.9 10*3/uL (ref 0.9–3.3)
nRBC: 0 % (ref 0–0)

## 2012-01-27 LAB — COMPREHENSIVE METABOLIC PANEL
ALT: 12 U/L (ref 0–53)
AST: 13 U/L (ref 0–37)
Albumin: 4.3 g/dL (ref 3.5–5.2)
Alkaline Phosphatase: 31 U/L — ABNORMAL LOW (ref 39–117)
Calcium: 9.7 mg/dL (ref 8.4–10.5)
Chloride: 104 mEq/L (ref 96–112)
Potassium: 4.2 mEq/L (ref 3.5–5.3)
Sodium: 140 mEq/L (ref 135–145)
Total Protein: 6.2 g/dL (ref 6.0–8.3)

## 2012-01-27 NOTE — Telephone Encounter (Signed)
appts made and printed for pt aom °

## 2012-01-27 NOTE — Patient Instructions (Signed)
1.  Diagnosis:  ITP (immune destruction of platelets): - stable plt count today. - Continue low dose Prednisone 5mg  by mouth daily. - Continue Calcium to decrease risk of Prednisone-induced osteoporosis.   2.  Follow up: - lab only appointment in about 3 months. - Return visit with my nurse practitioner in about 6 months.

## 2012-01-27 NOTE — Progress Notes (Signed)
[facility]  Telephone:(336) [phone] Fax:(336) [phone]   OFFICE PROGRESS NOTE   Cc:  Aida Puffer, MD, MD  DIAGNOSIS:   ITP  CURRENT THERAPY:  Prednisone taper now at 5mg  PO dailiy.   INTERVAL HISTORY: Thomas Nielsen 56 y.o. male returns for regular follow up.  He had right otitis media in March 2013.  He was given a course of antibiotic with some increase in his Cr.  Since then, his renal function has normalized.  He reports feeling well.  He denies ear pain, discharge, bleeding symptoms.  He works full time at [employer] without fatigue.   Patient denies headache, visual changes, confusion, drenching night sweats, palpable lymph node swelling, mucositis, odynophagia, dysphagia, nausea vomiting, jaundice, chest pain, palpitation, shortness of breath, dyspnea on exertion, productive cough, gum bleeding, epistaxis, hematemesis, hemoptysis, abdominal pain, abdominal swelling, early satiety, melena, hematochezia, hematuria, skin rash, spontaneous bleeding, joint swelling, joint pain, heat or cold intolerance, bowel bladder incontinence, back pain, focal motor weakness, paresthesia, depression, suicidal or homocidal ideation, feeling hopelessness.   Past Medical History  Diagnosis Date  . ITP (idiopathic thrombocytopenic purpura) 2011    on chronic low dose Prednisone  . CKD (chronic kidney disease)     on chronic low dose Prednisone    No past surgical history on file.  Current Outpatient Prescriptions  Medication Sig Dispense Refill  . atorvastatin (LIPITOR) 20 MG tablet Take 20 mg by mouth daily.       . calcium carbonate (OS-CAL) 600 MG TABS Take 600 mg by mouth daily.      . enalapril (VASOTEC) 10 MG tablet       . predniSONE (DELTASONE) 5 MG tablet Take 5 mg by mouth daily.        ALLERGIES:  is allergic to penicillins.  REVIEW OF SYSTEMS:  The rest of the 14-point review of system was negative.   Filed Vitals:   01/27/12 1459  BP: 123/82  Pulse: 73    Temp: 97 F (36.1 C)   Wt Readings from Last 3 Encounters:  01/27/12 172 lb 14.4 oz (78.427 kg)   ECOG Performance status: 0  PHYSICAL EXAMINATION:   General:  well-nourished man, in no acute distress.  Eyes:  no scleral icterus.  ENT:  There were no oropharyngeal lesions.  Neck was without thyromegaly.  Lymphatics:  Negative cervical, supraclavicular or axillary adenopathy.  Respiratory: lungs were clear bilaterally without wheezing or crackles.  Cardiovascular:  Regular rate and rhythm, S1/S2, without murmur, rub or gallop.  There was no pedal edema.  GI:  abdomen was soft, flat, nontender, nondistended, without organomegaly.  Muscoloskeletal:  no spinal tenderness of palpation of vertebral spine.  Skin exam was without echymosis, petichae.  Neuro exam was nonfocal.  Patient was able to get on and off exam table without assistance.  Gait was normal.  Patient was alerted and oriented.  Attention was good.   Language was appropriate.  Mood was normal without depression.  Speech was not pressured.  Thought content was not tangential.     LABORATORY/RADIOLOGY DATA:  Lab Results  Component Value Date   WBC 5.6 01/27/2012   HGB 12.0* 01/27/2012   HCT 34.9* 01/27/2012   PLT 150 01/27/2012   GLUCOSE 99 06/27/2011   GLUCOSE 99 06/27/2011   CHOL 161 07/20/2011   TRIG 128 07/20/2011   HDL 41 07/20/2011   LDLCALC 94 07/20/2011   ALKPHOS 31* 06/27/2011   ALKPHOS 31* 06/27/2011   ALT 10  06/27/2011   ALT 10 06/27/2011   AST 11 06/27/2011   AST 11 06/27/2011   NA 137 06/27/2011   NA 137 06/27/2011   K 4.1 06/27/2011   K 4.1 06/27/2011   CL 102 06/27/2011   CL 102 06/27/2011   CREATININE 1.15 06/27/2011   CREATININE 1.15 06/27/2011   BUN 28* 06/27/2011   BUN 28* 06/27/2011   CO2 25 06/27/2011   CO2 25 06/27/2011    ASSESSMENT AND PLAN:   1.  ITP:  His platelet is normal today. I recommended him to continue with prednisone 5 mg by mouth daily. I advised him to watch out for bleeding symptoms.  I  recommended that he increase his dose of calcium 2 twice a day to decrease her risk of steroid-induced osteoporosis.  2.  hyperlipidemia: He is on atorvastatin per PCP.  3.  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 He follows with Dr. Lowell Guitar.   4.  Hypertension:  Well controlled with enalapril per PCP.   5.  Follow up:  Lab only in 3 months.  RV in about 6 months.   The length of time of the face-to-face encounter was 15 minutes. More than 50% of time was spent counseling and coordination of care.

## 2012-01-31 ENCOUNTER — Other Ambulatory Visit: Payer: Self-pay | Admitting: *Deleted

## 2012-01-31 DIAGNOSIS — N052 Unspecified nephritic syndrome with diffuse membranous glomerulonephritis: Secondary | ICD-10-CM

## 2012-01-31 DIAGNOSIS — D693 Immune thrombocytopenic purpura: Secondary | ICD-10-CM

## 2012-01-31 MED ORDER — PREDNISONE 5 MG PO TABS: 5.0000 mg | ORAL_TABLET | Freq: Every day | ORAL | Status: DC

## 2012-04-27 ENCOUNTER — Other Ambulatory Visit: Payer: Self-pay | Admitting: *Deleted

## 2012-04-27 ENCOUNTER — Other Ambulatory Visit (HOSPITAL_BASED_OUTPATIENT_CLINIC_OR_DEPARTMENT_OTHER): Payer: BC Managed Care – PPO

## 2012-04-27 DIAGNOSIS — I10 Essential (primary) hypertension: Secondary | ICD-10-CM

## 2012-04-27 DIAGNOSIS — D693 Immune thrombocytopenic purpura: Secondary | ICD-10-CM

## 2012-04-27 LAB — CBC WITH DIFFERENTIAL/PLATELET
Basophils Absolute: 0 10*3/uL (ref 0.0–0.1)
EOS%: 0.8 % (ref 0.0–7.0)
Eosinophils Absolute: 0.1 10*3/uL (ref 0.0–0.5)
HGB: 12.6 g/dL — ABNORMAL LOW (ref 13.0–17.1)
LYMPH%: 11.6 % — ABNORMAL LOW (ref 14.0–49.0)
MCH: 31.1 pg (ref 27.2–33.4)
MCV: 90 fL (ref 79.3–98.0)
MONO%: 8.4 % (ref 0.0–14.0)
Platelets: 147 10*3/uL (ref 140–400)
RDW: 13.3 % (ref 11.0–14.6)

## 2012-04-30 ENCOUNTER — Telehealth: Payer: Self-pay | Admitting: *Deleted

## 2012-04-30 NOTE — Telephone Encounter (Signed)
Called pt informed of lab results, Platelets wnl and continue Prednisone 5 mg daily.  Keep next appt as scheduled in October.  Pt verbalized understanding.

## 2012-04-30 NOTE — Telephone Encounter (Signed)
Message copied by Wende Mott on Mon Apr 30, 2012 10:29 AM ------      Message from: HA, Raliegh Ip T      Created: Mon Apr 30, 2012  8:57 AM       Please call pt.  His plt count is stable.  Continue Prednisone at current dose (5mg  PO daily).

## 2012-06-12 NOTE — Progress Notes (Signed)
Received office notes from Dr. Lowell Guitar @ [facility]; forwarded to Dr. Gaylyn Rong.

## 2012-07-27 ENCOUNTER — Other Ambulatory Visit (HOSPITAL_BASED_OUTPATIENT_CLINIC_OR_DEPARTMENT_OTHER): Payer: BC Managed Care – PPO

## 2012-07-27 ENCOUNTER — Encounter: Payer: Self-pay | Admitting: Oncology

## 2012-07-27 ENCOUNTER — Ambulatory Visit (HOSPITAL_BASED_OUTPATIENT_CLINIC_OR_DEPARTMENT_OTHER): Payer: BC Managed Care – PPO | Admitting: Oncology

## 2012-07-27 ENCOUNTER — Other Ambulatory Visit: Payer: Self-pay | Admitting: Oncology

## 2012-07-27 ENCOUNTER — Telehealth: Payer: Self-pay | Admitting: Oncology

## 2012-07-27 VITALS — BP 120/75 | HR 73 | Temp 98.4°F | Resp 20 | Ht 70.0 in | Wt 171.3 lb

## 2012-07-27 DIAGNOSIS — D693 Immune thrombocytopenic purpura: Secondary | ICD-10-CM

## 2012-07-27 DIAGNOSIS — N189 Chronic kidney disease, unspecified: Secondary | ICD-10-CM

## 2012-07-27 DIAGNOSIS — I10 Essential (primary) hypertension: Secondary | ICD-10-CM

## 2012-07-27 DIAGNOSIS — N052 Unspecified nephritic syndrome with diffuse membranous glomerulonephritis: Secondary | ICD-10-CM

## 2012-07-27 LAB — COMPREHENSIVE METABOLIC PANEL (CC13)
AST: 10 U/L (ref 5–34)
Albumin: 3.6 g/dL (ref 3.5–5.0)
Alkaline Phosphatase: 30 U/L — ABNORMAL LOW (ref 40–150)
Potassium: 4.4 mEq/L (ref 3.5–5.1)
Sodium: 140 mEq/L (ref 136–145)
Total Bilirubin: 0.6 mg/dL (ref 0.20–1.20)
Total Protein: 6.2 g/dL — ABNORMAL LOW (ref 6.4–8.3)

## 2012-07-27 LAB — CBC WITH DIFFERENTIAL/PLATELET
BASO%: 0.7 % (ref 0.0–2.0)
EOS%: 1.3 % (ref 0.0–7.0)
Eosinophils Absolute: 0.1 10*3/uL (ref 0.0–0.5)
LYMPH%: 18.2 % (ref 14.0–49.0)
MCH: 29.9 pg (ref 27.2–33.4)
MCHC: 34 g/dL (ref 32.0–36.0)
MCV: 88 fL (ref 79.3–98.0)
MONO%: 6.9 % (ref 0.0–14.0)
NEUT#: 3.9 10*3/uL (ref 1.5–6.5)
Platelets: 167 10*3/uL (ref 140–400)
RBC: 3.84 10*6/uL — ABNORMAL LOW (ref 4.20–5.82)
RDW: 13.1 % (ref 11.0–14.6)

## 2012-07-27 NOTE — Patient Instructions (Addendum)
1. Diagnosis: ITP (immune destruction of platelets):  - stable plt count today.  - Continue low dose Prednisone 5mg  by mouth daily.  - Continue Calcium to decrease risk of Prednisone-induced osteoporosis.   2. Follow up:  - lab only appointment in about 3 months.  - Return visit in about 6 months.

## 2012-07-27 NOTE — Telephone Encounter (Signed)
Gave pt apt for January 2014 lab only then MD visit in April 2014 with labs

## 2012-07-27 NOTE — Progress Notes (Signed)
[facility]  Telephone:(336) [phone] Fax:(336) [phone]   OFFICE PROGRESS NOTE   Cc:  LITTLE,JAMES, MD  DIAGNOSIS:   ITP  CURRENT THERAPY:  Prednisone taper now at 5mg  PO dailiy.   INTERVAL HISTORY: Thomas Nielsen 56 y.o. male returns for regular follow up. Tolerating Prednisone well. No difficulty sleeping. No edema. He denies bleeding symptoms.  He works full time at [employer] without fatigue.   Patient denies headache, visual changes, confusion, drenching night sweats, palpable lymph node swelling, mucositis, odynophagia, dysphagia, nausea vomiting, jaundice, chest pain, palpitation, shortness of breath, dyspnea on exertion, productive cough, gum bleeding, epistaxis, hematemesis, hemoptysis, abdominal pain, abdominal swelling, early satiety, melena, hematochezia, hematuria, skin rash, spontaneous bleeding, joint swelling, joint pain, heat or cold intolerance, bowel bladder incontinence, back pain, focal motor weakness, paresthesia, depression, suicidal or homocidal ideation, feeling hopelessness.   Past Medical History  Diagnosis Date  . ITP (idiopathic thrombocytopenic purpura) 2011    on chronic low dose Prednisone  . CKD (chronic kidney disease)     on chronic low dose Prednisone  . 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 HTN (hypertension)   . Hyperlipidemia     History reviewed. No pertinent past surgical history.  Current Outpatient Prescriptions  Medication Sig Dispense Refill  . calcium carbonate (OS-CAL) 600 MG TABS Take 600 mg by mouth daily.      . enalapril (VASOTEC) 10 MG tablet       . predniSONE (DELTASONE) 5 MG tablet Take 1 tablet (5 mg total) by mouth daily.  100 tablet  0    ALLERGIES:  is allergic to penicillins.  REVIEW OF SYSTEMS:  The rest of the 14-point review of system was negative.   Filed Vitals:   07/27/12 1515  BP: 120/75  Pulse: 73  Temp: 98.4 F (36.9 C)  Resp: 20   Wt Readings from Last 3 Encounters:    07/27/12 171 lb 4.8 oz (77.701 kg)  01/27/12 172 lb 14.4 oz (78.427 kg)   ECOG Performance status: 0  PHYSICAL EXAMINATION:   General:  well-nourished man, in no acute distress.  Eyes:  no scleral icterus.  ENT:  There were no oropharyngeal lesions.  Neck was without thyromegaly.  Lymphatics:  Negative cervical, supraclavicular or axillary adenopathy.  Respiratory: lungs were clear bilaterally without wheezing or crackles.  Cardiovascular:  Regular rate and rhythm, S1/S2, without murmur, rub or gallop.  There was no pedal edema.  GI:  abdomen was soft, flat, nontender, nondistended, without organomegaly.  Muscoloskeletal:  no spinal tenderness of palpation of vertebral spine.  Skin exam was without echymosis, petichae.  Neuro exam was nonfocal.  Patient was able to get on and off exam table without assistance.  Gait was normal.  Patient was alerted and oriented.  Attention was good.   Language was appropriate.  Mood was normal without depression.  Speech was not pressured.  Thought content was not tangential.     LABORATORY/RADIOLOGY DATA:  Lab Results  Component Value Date   WBC 5.4 07/27/2012   HGB 11.5* 07/27/2012   HCT 33.8* 07/27/2012   PLT 167 07/27/2012   GLUCOSE 101* 07/27/2012   CHOL 161 07/20/2011   TRIG 128 07/20/2011   HDL 41 07/20/2011   LDLCALC 94 07/20/2011   ALKPHOS 30* 07/27/2012   ALT 9 07/27/2012   AST 10 07/27/2012   NA 140 07/27/2012   K 4.4 07/27/2012   CL 109* 07/27/2012   CREATININE 1.1 07/27/2012   BUN 26.0 07/27/2012  CO2 23 07/27/2012    ASSESSMENT AND PLAN:   1.  ITP:  His platelet is normal today. I recommended him to continue with prednisone 5 mg by mouth daily. I advised him to watch out for bleeding symptoms.  I recommended that continue calcium 2 twice a day to decrease her risk of steroid-induced osteoporosis.  2.  hyperlipidemia: Off Atorvastatin per PCP.  3.  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 He  follows with Dr. Lowell Guitar.   4.  Hypertension:  Well controlled with enalapril per PCP.   5.  Follow up:  Lab only in 3 months.  RV in about 6 months.   The length of time of the face-to-face encounter was 15 minutes. More than 50% of time was spent counseling and coordination of care.

## 2012-10-26 ENCOUNTER — Other Ambulatory Visit (HOSPITAL_BASED_OUTPATIENT_CLINIC_OR_DEPARTMENT_OTHER): Payer: Managed Care, Other (non HMO) | Admitting: Lab

## 2012-10-26 DIAGNOSIS — D693 Immune thrombocytopenic purpura: Secondary | ICD-10-CM

## 2012-10-26 LAB — CBC WITH DIFFERENTIAL/PLATELET
EOS%: 0.8 % (ref 0.0–7.0)
Eosinophils Absolute: 0 10*3/uL (ref 0.0–0.5)
LYMPH%: 18.4 % (ref 14.0–49.0)
MCH: 30.8 pg (ref 27.2–33.4)
MCHC: 34.7 g/dL (ref 32.0–36.0)
MCV: 88.7 fL (ref 79.3–98.0)
MONO%: 9.8 % (ref 0.0–14.0)
NEUT#: 4.3 10*3/uL (ref 1.5–6.5)
Platelets: 185 10*3/uL (ref 140–400)
RBC: 3.94 10*6/uL — ABNORMAL LOW (ref 4.20–5.82)

## 2012-10-29 ENCOUNTER — Telehealth: Payer: Self-pay | Admitting: *Deleted

## 2012-10-29 NOTE — Telephone Encounter (Signed)
Message copied by Wende Mott on Mon Oct 29, 2012  2:51 PM ------      Message from: Clenton Pare R      Created: Mon Oct 29, 2012  8:43 AM       Please call pt (I tried calling him on Friday and left a VM). Let him know that counts are stable. Continue observation. Recheck as scheduled.

## 2012-10-29 NOTE — Telephone Encounter (Signed)
Pt returned call and I informed him of lab results stable, continue observation.  Keep next appt on 01/25/13 as scheduled. He verbalized understanding.

## 2013-01-08 ENCOUNTER — Telehealth: Payer: Self-pay | Admitting: Oncology

## 2013-01-08 NOTE — Telephone Encounter (Signed)
Moved 4/25 lb/HH to 11:30am due to call day. lmonvm for pt and mailed new schedule.

## 2013-01-21 NOTE — Patient Instructions (Addendum)
1. Diagnosis: History of ITP. 2. Treatment: Continue low-dose prednisone given for your chronic kidney issue. 3. Followup: Lab test only in about 4 months and return to clinic in about 8 months.

## 2013-01-25 ENCOUNTER — Telehealth: Payer: Self-pay | Admitting: Oncology

## 2013-01-25 ENCOUNTER — Ambulatory Visit (HOSPITAL_BASED_OUTPATIENT_CLINIC_OR_DEPARTMENT_OTHER): Payer: Managed Care, Other (non HMO) | Admitting: Oncology

## 2013-01-25 ENCOUNTER — Other Ambulatory Visit (HOSPITAL_BASED_OUTPATIENT_CLINIC_OR_DEPARTMENT_OTHER): Payer: Managed Care, Other (non HMO)

## 2013-01-25 VITALS — BP 127/82 | HR 77 | Temp 97.3°F | Resp 18 | Ht 70.0 in | Wt 167.8 lb

## 2013-01-25 DIAGNOSIS — D693 Immune thrombocytopenic purpura: Secondary | ICD-10-CM

## 2013-01-25 LAB — CBC WITH DIFFERENTIAL/PLATELET
Basophils Absolute: 0 10*3/uL (ref 0.0–0.1)
EOS%: 0.2 % (ref 0.0–7.0)
Eosinophils Absolute: 0 10*3/uL (ref 0.0–0.5)
NEUT%: 91.3 % — ABNORMAL HIGH (ref 39.0–75.0)
Platelets: 164 10*3/uL (ref 140–400)
RDW: 13.8 % (ref 11.0–14.6)
WBC: 8.4 10*3/uL (ref 4.0–10.3)
lymph#: 0.5 10*3/uL — ABNORMAL LOW (ref 0.9–3.3)

## 2013-01-25 LAB — COMPREHENSIVE METABOLIC PANEL (CC13)
AST: 13 U/L (ref 5–34)
Albumin: 3.7 g/dL (ref 3.5–5.0)
BUN: 27.6 mg/dL — ABNORMAL HIGH (ref 7.0–26.0)
CO2: 24 mEq/L (ref 22–29)
Calcium: 10.1 mg/dL (ref 8.4–10.4)
Chloride: 105 mEq/L (ref 98–107)
Creatinine: 1.1 mg/dL (ref 0.7–1.3)
Glucose: 109 mg/dl — ABNORMAL HIGH (ref 70–99)
Potassium: 4.4 mEq/L (ref 3.5–5.1)

## 2013-01-25 NOTE — Telephone Encounter (Signed)
gv and printed appt sched and avs for pt for Aug and Dec..Marland Kitchen

## 2013-01-29 NOTE — Progress Notes (Signed)
[facility]  Telephone:(336) [phone] Fax:(336) [phone]   OFFICE PROGRESS NOTE   Cc:  LITTLE,JAMES, MD  DIAGNOSIS:   ITP  CURRENT THERAPY:  Prednisone taper now at 5mg  PO dailiy.   INTERVAL HISTORY: Thomas Nielsen 57 y.o. male returns for regular follow up by himself. He reports feeling well.  He still works full time. He denies fatigue, anorexia, bleeding symptoms, skin rash, bone pain, recurrent infection.  The rest of the 14-point review of system was negative.    Past Medical History  Diagnosis Date  . ITP (idiopathic thrombocytopenic purpura) 2011    on chronic low dose Prednisone  . CKD (chronic kidney disease)     on chronic low dose Prednisone  . 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 HTN (hypertension)   . Hyperlipidemia     No past surgical history on file.  Current Outpatient Prescriptions  Medication Sig Dispense Refill  . atorvastatin (LIPITOR) 10 MG tablet Take 10 mg by mouth daily.      . calcium carbonate (OS-CAL) 600 MG TABS Take 600 mg by mouth daily.      . enalapril (VASOTEC) 10 MG tablet Take 10 mg by mouth daily.       . predniSONE (DELTASONE) 5 MG tablet Take 1 tablet (5 mg total) by mouth daily.  100 tablet  0   No current facility-administered medications for this visit.    ALLERGIES:  is allergic to penicillins.  REVIEW OF SYSTEMS:  The rest of the 14-point review of system was negative.   Filed Vitals:   01/25/13 1132  BP: 127/82  Pulse: 77  Temp: 97.3 F (36.3 C)  Resp: 18   Wt Readings from Last 3 Encounters:  01/25/13 167 lb 12.8 oz (76.114 kg)  07/27/12 171 lb 4.8 oz (77.701 kg)  01/27/12 172 lb 14.4 oz (78.427 kg)   ECOG Performance status: 0  PHYSICAL EXAMINATION:   General:  well-nourished man, in no acute distress.  Eyes:  no scleral icterus.  ENT:  There were no oropharyngeal lesions.  Neck was without thyromegaly.  Lymphatics:  Negative cervical, supraclavicular or axillary adenopathy.  Respiratory:  lungs were clear bilaterally without wheezing or crackles.  Cardiovascular:  Regular rate and rhythm, S1/S2, without murmur, rub or gallop.  There was no pedal edema.  GI:  abdomen was soft, flat, nontender, nondistended, without organomegaly.  Muscoloskeletal:  no spinal tenderness of palpation of vertebral spine.  Skin exam was without echymosis, petichae.  Neuro exam was nonfocal.  Patient was able to get on and off exam table without assistance.  Gait was normal.  Patient was alert and oriented.  Attention was good.   Language was appropriate.  Mood was normal without depression.  Speech was not pressured.  Thought content was not tangential.     LABORATORY/RADIOLOGY DATA:  Lab Results  Component Value Date   WBC 8.4 01/25/2013   HGB 12.6* 01/25/2013   HCT 37.1* 01/25/2013   PLT 164 01/25/2013   GLUCOSE 109* 01/25/2013   CHOL 161 07/20/2011   TRIG 128 07/20/2011   HDL 41 07/20/2011   LDLCALC 94 07/20/2011   ALKPHOS 36* 01/25/2013   ALT 12 01/25/2013   AST 13 01/25/2013   NA 140 01/25/2013   K 4.4 01/25/2013   CL 105 01/25/2013   CREATININE 1.1 01/25/2013   BUN 27.6* 01/25/2013   CO2 24 01/25/2013    ASSESSMENT AND PLAN:   1.  ITP:  He is in remission.  He is on the low dose of Prednisone 5mg  PO daily to history of membranous glomerulopathy and not for his ITP.  I advised him to take Cal/Vit D to decrease the risk of steroid-induced osteoporosis.    2.  hyperlipidemia: He is on atorvastatin per PCP.  3.  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 He follows with Dr. Lowell Guitar.   4.  Hypertension:  On enalapril per PCP.   5.  Follow up:  Lab only in 6 months.  RV in about 12 months.    I informed Thomas Nielsen that I'm leaving the practice.  [facility] follow up with a new provider when he returns.    The length of time of the face-to-face encounter was 10 minutes. More than 50% of time was spent counseling and coordination of  care.

## 2013-05-17 ENCOUNTER — Other Ambulatory Visit (HOSPITAL_BASED_OUTPATIENT_CLINIC_OR_DEPARTMENT_OTHER): Payer: Managed Care, Other (non HMO)

## 2013-05-17 ENCOUNTER — Telehealth: Payer: Self-pay | Admitting: *Deleted

## 2013-05-17 DIAGNOSIS — D693 Immune thrombocytopenic purpura: Secondary | ICD-10-CM

## 2013-05-17 LAB — CBC WITH DIFFERENTIAL/PLATELET
Basophils Absolute: 0.1 10*3/uL (ref 0.0–0.1)
EOS%: 1.5 % (ref 0.0–7.0)
Eosinophils Absolute: 0.1 10*3/uL (ref 0.0–0.5)
HCT: 34.9 % — ABNORMAL LOW (ref 38.4–49.9)
HGB: 12.2 g/dL — ABNORMAL LOW (ref 13.0–17.1)
MCH: 30.9 pg (ref 27.2–33.4)
MONO#: 0.5 10*3/uL (ref 0.1–0.9)
NEUT#: 5.1 10*3/uL (ref 1.5–6.5)
NEUT%: 75.1 % — ABNORMAL HIGH (ref 39.0–75.0)
RDW: 13.4 % (ref 11.0–14.6)
WBC: 6.8 10*3/uL (ref 4.0–10.3)
lymph#: 1.1 10*3/uL (ref 0.9–3.3)

## 2013-05-17 NOTE — Telephone Encounter (Signed)
Message copied by Wende Mott on Fri May 17, 2013  5:06 PM ------      Message from: Myrtis Ser      Created: Fri May 17, 2013  4:02 PM       Please call pt. Plt remain stable. Recommend continued observation. ------

## 2013-05-17 NOTE — Telephone Encounter (Signed)
Left VM for pt informing of labs stable and to keep next lab appt as scheduled.  Call us back if any questions.

## 2013-09-05 ENCOUNTER — Other Ambulatory Visit: Payer: Self-pay | Admitting: Hematology and Oncology

## 2013-09-05 DIAGNOSIS — D693 Immune thrombocytopenic purpura: Secondary | ICD-10-CM

## 2013-09-06 ENCOUNTER — Other Ambulatory Visit (HOSPITAL_BASED_OUTPATIENT_CLINIC_OR_DEPARTMENT_OTHER): Payer: Managed Care, Other (non HMO)

## 2013-09-06 ENCOUNTER — Encounter: Payer: Self-pay | Admitting: Hematology and Oncology

## 2013-09-06 ENCOUNTER — Ambulatory Visit (HOSPITAL_BASED_OUTPATIENT_CLINIC_OR_DEPARTMENT_OTHER): Payer: Managed Care, Other (non HMO) | Admitting: Hematology and Oncology

## 2013-09-06 ENCOUNTER — Telehealth: Payer: Self-pay | Admitting: *Deleted

## 2013-09-06 VITALS — BP 128/85 | HR 72 | Temp 98.5°F | Resp 19 | Ht 70.0 in | Wt 174.5 lb

## 2013-09-06 DIAGNOSIS — D649 Anemia, unspecified: Secondary | ICD-10-CM

## 2013-09-06 DIAGNOSIS — N052 Unspecified nephritic syndrome with diffuse membranous glomerulonephritis: Secondary | ICD-10-CM

## 2013-09-06 DIAGNOSIS — D693 Immune thrombocytopenic purpura: Secondary | ICD-10-CM

## 2013-09-06 HISTORY — DX: Anemia, unspecified: D64.9

## 2013-09-06 LAB — CBC WITH DIFFERENTIAL/PLATELET
BASO%: 1.1 % (ref 0.0–2.0)
EOS%: 1.4 % (ref 0.0–7.0)
HCT: 35.6 % — ABNORMAL LOW (ref 38.4–49.9)
HGB: 12.1 g/dL — ABNORMAL LOW (ref 13.0–17.1)
MCH: 30.6 pg (ref 27.2–33.4)
MCHC: 34 g/dL (ref 32.0–36.0)
MONO#: 0.6 10*3/uL (ref 0.1–0.9)
NEUT#: 5.1 10*3/uL (ref 1.5–6.5)
RDW: 13.2 % (ref 11.0–14.6)
WBC: 7.1 10*3/uL (ref 4.0–10.3)
lymph#: 1.2 10*3/uL (ref 0.9–3.3)

## 2013-09-06 NOTE — Progress Notes (Signed)
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 The patient has significant thrombocytopenia in the past responded well to prednisone. Currently he remainence therapy of prednisone 5 mg a day to prevent a flare of his membranous glomerulonephritis. INTERVAL HISTORY: Thomas Nielsen 57 y.o. male returns for further followup. He denies any recent fever, chills, night sweats or abnormal weight loss The patient denies any recent signs or symptoms of bleeding such as spontaneous epistaxis, hematuria or hematochezia. The patient denies any recent signs or symptoms of bleeding such as spontaneous epistaxis, hematuria or hematochezia.  I have reviewed the past medical history, past surgical history, social history and family history with the patient and they are unchanged from previous note.  ALLERGIES:  is allergic to penicillins.  MEDICATIONS:  Current Outpatient Prescriptions  Medication Sig Dispense Refill  . atorvastatin (LIPITOR) 10 MG tablet Take 10 mg by mouth daily.      . calcium carbonate (OS-CAL) 600 MG TABS Take 600 mg by mouth daily.      . enalapril (VASOTEC) 10 MG tablet Take 10 mg by mouth daily.       . predniSONE (DELTASONE) 5 MG tablet Take 1 tablet (5 mg total) by mouth daily.  100 tablet  0   No current facility-administered medications for this visit.     REVIEW OF SYSTEMS:   Constitutional: Denies fevers, chills or night sweats Eyes: Denies blurriness of vision Ears, nose, mouth, throat, and face: Denies mucositis or sore throat Respiratory: Denies cough, dyspnea or wheezes Cardiovascular: Denies palpitation, chest discomfort or lower extremity swelling Gastrointestinal:  Denies nausea, heartburn or change in bowel habits Skin: Denies abnormal skin  rashes Lymphatics: Denies new lymphadenopathy or easy bruising Neurological:Denies numbness, tingling or new weaknesses Behavioral/Psych: Mood is stable, no new changes  All other systems were reviewed with the patient and are negative.  PHYSICAL EXAMINATION: ECOG PERFORMANCE STATUS: 0 - Asymptomatic  Filed Vitals:   09/06/13 1541  BP: 128/85  Pulse: 72  Temp: 98.5 F (36.9 C)  Resp: 19   Filed Weights   09/06/13 1541  Weight: 174 lb 8 oz (79.153 kg)    GENERAL:alert, no distress and comfortable SKIN: skin color, texture, turgor are normal, no rashes or significant lesions EYES: normal, Conjunctiva are pink and non-injected, sclera clear OROPHARYNX:no exudate, no erythema and lips, buccal mucosa, and tongue normal  NECK: supple, thyroid normal size, non-tender, without nodularity LYMPH:  no palpable lymphadenopathy in the cervical, axillary or inguinal LUNGS: clear to auscultation and percussion with normal breathing effort HEART: regular rate & rhythm and no murmurs and no lower extremity edema ABDOMEN:abdomen soft, non-tender and normal bowel sounds Musculoskeletal:no cyanosis of digits and no clubbing  NEURO: alert & oriented x 3 with fluent speech, no focal motor/sensory deficits  LABORATORY DATA:  I have reviewed the data as listed Results for orders placed in visit on 09/06/13 (from the past 48 hour(s))  CBC WITH DIFFERENTIAL     Status: Abnormal   Collection Time    09/06/13  3:28 PM      Result Value Range   WBC 7.1  4.0 - 10.3 10e3/uL   NEUT# 5.1  1.5 - 6.5 10e3/uL   HGB 12.1 (*) 13.0 - 17.1 g/dL   HCT 16.1 (*) 09.6 - 04.5 %   Platelets  165  140 - 400 10e3/uL   MCV 90.0  79.3 - 98.0 fL   MCH 30.6  27.2 - 33.4 pg   MCHC 34.0  32.0 - 36.0 g/dL   RBC 1.61 (*) 0.96 - 0.45 10e6/uL   RDW 13.2  11.0 - 14.6 %   lymph# 1.2  0.9 - 3.3 10e3/uL   MONO# 0.6  0.1 - 0.9 10e3/uL   Eosinophils Absolute 0.1  0.0 - 0.5 10e3/uL   Basophils Absolute 0.1  0.0 - 0.1 10e3/uL    NEUT% 72.5  39.0 - 75.0 %   LYMPH% 17.2  14.0 - 49.0 %   MONO% 7.8  0.0 - 14.0 %   EOS% 1.4  0.0 - 7.0 %   BASO% 1.1  0.0 - 2.0 %    Lab Results  Component Value Date   WBC 7.1 09/06/2013   HGB 12.1* 09/06/2013   HCT 35.6* 09/06/2013   MCV 90.0 09/06/2013   PLT 165 09/06/2013   ASSESSMENT & PLAN:  #1 history of ITP As long as he remains on prednisone, his platelet count remains stable. I do not see a problem for him to remain on 5 mg prednisone indefinitely. We discussed about signs and symptoms to watch out for recurrence of ITP including easy bruising and spontaneous bleeding. #2 anemia This is likely anemia of chronic disease. The patient denies recent history of bleeding such as epistaxis, hematuria or hematochezia. He is asymptomatic from the anemia. We will observe for now.  He does not require transfusion now.  #3 membranous glomerulonephritis He'll remain on prednisone under the direction of his nephrologist #4 preventive care The patient has received influenza vaccination recently. I recommend he add vitamin D supplement due to risk of osteoporosis in the long run while he remained on prednisone. All questions were answered. The patient knows to call the clinic with any problems, questions or concerns. No barriers to learning was detected.  I spent 15 minutes counseling the patient face to face. The total time spent in the appointment was 20 minutes and more than 50% was on counseling.     [facility], Kaynan Klonowski, MD 09/06/2013 4:19 PM

## 2013-09-06 NOTE — Telephone Encounter (Signed)
Lm gv appt for 03/07/14 w/ labs@ 2:15pm and ov @ 2:45pm. Made pt aware that i will mail a letter/avs...td

## 2014-03-07 ENCOUNTER — Other Ambulatory Visit (HOSPITAL_BASED_OUTPATIENT_CLINIC_OR_DEPARTMENT_OTHER): Payer: Managed Care, Other (non HMO)

## 2014-03-07 ENCOUNTER — Ambulatory Visit (HOSPITAL_BASED_OUTPATIENT_CLINIC_OR_DEPARTMENT_OTHER): Payer: Managed Care, Other (non HMO) | Admitting: Hematology and Oncology

## 2014-03-07 ENCOUNTER — Encounter: Payer: Self-pay | Admitting: Hematology and Oncology

## 2014-03-07 VITALS — BP 114/72 | HR 83 | Temp 97.4°F | Resp 18 | Ht 70.0 in | Wt 172.4 lb

## 2014-03-07 DIAGNOSIS — N189 Chronic kidney disease, unspecified: Secondary | ICD-10-CM

## 2014-03-07 DIAGNOSIS — D693 Immune thrombocytopenic purpura: Secondary | ICD-10-CM

## 2014-03-07 DIAGNOSIS — D649 Anemia, unspecified: Secondary | ICD-10-CM

## 2014-03-07 LAB — CBC & DIFF AND RETIC
BASO%: 0.6 % (ref 0.0–2.0)
Basophils Absolute: 0 10*3/uL (ref 0.0–0.1)
EOS ABS: 0.1 10*3/uL (ref 0.0–0.5)
EOS%: 1.2 % (ref 0.0–7.0)
HEMATOCRIT: 34.7 % — AB (ref 38.4–49.9)
HEMOGLOBIN: 11.9 g/dL — AB (ref 13.0–17.1)
IMMATURE RETIC FRACT: 3.1 % (ref 3.00–10.60)
LYMPH#: 0.9 10*3/uL (ref 0.9–3.3)
LYMPH%: 17 % (ref 14.0–49.0)
MCH: 30.1 pg (ref 27.2–33.4)
MCHC: 34.3 g/dL (ref 32.0–36.0)
MCV: 87.6 fL (ref 79.3–98.0)
MONO#: 0.4 10*3/uL (ref 0.1–0.9)
MONO%: 7.3 % (ref 0.0–14.0)
NEUT%: 73.9 % (ref 39.0–75.0)
NEUTROS ABS: 3.8 10*3/uL (ref 1.5–6.5)
PLATELETS: 169 10*3/uL (ref 140–400)
RBC: 3.96 10*6/uL — ABNORMAL LOW (ref 4.20–5.82)
RDW: 13.1 % (ref 11.0–14.6)
Retic %: 1.26 % (ref 0.80–1.80)
Retic Ct Abs: 49.9 10*3/uL (ref 34.80–93.90)
WBC: 5.1 10*3/uL (ref 4.0–10.3)

## 2014-03-07 LAB — COMPREHENSIVE METABOLIC PANEL (CC13)
ALT: 14 U/L (ref 0–55)
AST: 13 U/L (ref 5–34)
Albumin: 3.7 g/dL (ref 3.5–5.0)
Alkaline Phosphatase: 33 U/L — ABNORMAL LOW (ref 40–150)
Anion Gap: 13 mEq/L — ABNORMAL HIGH (ref 3–11)
BILIRUBIN TOTAL: 0.93 mg/dL (ref 0.20–1.20)
BUN: 32.5 mg/dL — AB (ref 7.0–26.0)
CO2: 19 mEq/L — ABNORMAL LOW (ref 22–29)
Calcium: 9.7 mg/dL (ref 8.4–10.4)
Chloride: 110 mEq/L — ABNORMAL HIGH (ref 98–109)
Creatinine: 1.3 mg/dL (ref 0.7–1.3)
GLUCOSE: 109 mg/dL (ref 70–140)
Potassium: 4.2 mEq/L (ref 3.5–5.1)
SODIUM: 141 meq/L (ref 136–145)
Total Protein: 6.6 g/dL (ref 6.4–8.3)

## 2014-03-07 LAB — IRON AND TIBC CHCC
%SAT: 26 % (ref 20–55)
IRON: 67 ug/dL (ref 42–163)
TIBC: 259 ug/dL (ref 202–409)
UIBC: 191 ug/dL (ref 117–376)

## 2014-03-07 LAB — SEDIMENTATION RATE: Sed Rate: 14 mm/hr (ref 0–16)

## 2014-03-07 LAB — VITAMIN B12: VITAMIN B 12: 480 pg/mL (ref 211–911)

## 2014-03-07 LAB — FERRITIN CHCC: FERRITIN: 100 ng/mL (ref 22–316)

## 2014-03-07 NOTE — Assessment & Plan Note (Signed)
This is likely anemia of chronic disease. The patient denies recent history of bleeding such as epistaxis, hematuria or hematochezia. He is asymptomatic from the anemia. We will observe for now.  

## 2014-03-07 NOTE — Assessment & Plan Note (Signed)
This is due to membranous glomerulonephritis. He will continue followup with his nephrologist.

## 2014-03-07 NOTE — Assessment & Plan Note (Signed)
This has not recurred. As long as he stays on 5 mg prednisone, his platelet count remain normal. I would discharge patient from the clinic.

## 2014-03-07 NOTE — Progress Notes (Signed)
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 The patient has significant thrombocytopenia in the past responded well to prednisone. Currently he remainence therapy of prednisone 5 mg a day to prevent a flare of his membranous glomerulonephritis. INTERVAL HISTORY: Thomas Nielsen 58 y.o. male returns for further followup. The patient denies any recent signs or symptoms of bleeding such as spontaneous epistaxis, hematuria or hematochezia. He is doing well on prednisone.  I have reviewed the past medical history, past surgical history, social history and family history with the patient and they are unchanged from previous note.  ALLERGIES:  is allergic to penicillins.  MEDICATIONS:  Current Outpatient Prescriptions  Medication Sig Dispense Refill  . atorvastatin (LIPITOR) 10 MG tablet Take 10 mg by mouth daily.      . calcium carbonate (OS-CAL) 600 MG TABS Take 600 mg by mouth daily.      . enalapril (VASOTEC) 10 MG tablet Take 10 mg by mouth daily.       . predniSONE (DELTASONE) 5 MG tablet Take 1 tablet (5 mg total) by mouth daily.  100 tablet  0   No current facility-administered medications for this visit.     REVIEW OF SYSTEMS:   Constitutional: Denies fevers, chills or night sweats Eyes: Denies blurriness of vision Ears, nose, mouth, throat, and face: Denies mucositis or sore throat Respiratory: Denies cough, dyspnea or wheezes Cardiovascular: Denies palpitation, chest discomfort or lower extremity swelling Gastrointestinal:  Denies nausea, heartburn or change in bowel habits Skin: Denies abnormal skin rashes Lymphatics: Denies new lymphadenopathy or easy bruising Neurological:Denies numbness, tingling or new weaknesses Behavioral/Psych: Mood is stable, no new  changes  All other systems were reviewed with the patient and are negative.  PHYSICAL EXAMINATION: ECOG PERFORMANCE STATUS: 0 - Asymptomatic  Filed Vitals:   03/07/14 1429  BP: 114/72  Pulse: 83  Temp: 97.4 F (36.3 C)  Resp: 18   Filed Weights   03/07/14 1429  Weight: 172 lb 6.4 oz (78.2 kg)    GENERAL:alert, no distress and comfortable SKIN: Noted dry skin on his hands. EYES: normal, Conjunctiva are pink and non-injected, sclera clear OROPHARYNX:no exudate, no erythema and lips, buccal mucosa, and tongue normal  NECK: supple, thyroid normal size, non-tender, without nodularity LYMPH:  no palpable lymphadenopathy in the cervical, axillary or inguinal LUNGS: clear to auscultation and percussion with normal breathing effort HEART: regular rate & rhythm and no murmurs and no lower extremity edema ABDOMEN:abdomen soft, non-tender and normal bowel sounds Musculoskeletal:no cyanosis of digits and no clubbing  NEURO: alert & oriented x 3 with fluent speech, no focal motor/sensory deficits  LABORATORY DATA:  I have reviewed the data as listed Results for orders placed in visit on 03/07/14 (from the past 48 hour(s))  CBC & DIFF AND RETIC     Status: Abnormal   Collection Time    03/07/14  2:18 PM      Result Value Ref Range   WBC 5.1  4.0 - 10.3 10e3/uL   NEUT# 3.8  1.5 - 6.5 10e3/uL   HGB 11.9 (*) 13.0 - 17.1 g/dL   HCT 34.7 (*) 38.4 - 49.9 %   Platelets 169  140 - 400 10e3/uL   MCV 87.6  79.3 - 98.0 fL   MCH 30.1  27.2 - 33.4 pg  MCHC 34.3  32.0 - 36.0 g/dL   RBC 3.96 (*) 4.20 - 5.82 10e6/uL   RDW 13.1  11.0 - 14.6 %   lymph# 0.9  0.9 - 3.3 10e3/uL   MONO# 0.4  0.1 - 0.9 10e3/uL   Eosinophils Absolute 0.1  0.0 - 0.5 10e3/uL   Basophils Absolute 0.0  0.0 - 0.1 10e3/uL   NEUT% 73.9  39.0 - 75.0 %   LYMPH% 17.0  14.0 - 49.0 %   MONO% 7.3  0.0 - 14.0 %   EOS% 1.2  0.0 - 7.0 %   BASO% 0.6  0.0 - 2.0 %   Retic % 1.26  0.80 - 1.80 %   Retic Ct Abs 49.90  34.80 - 93.90  10e3/uL   Immature Retic Fract 3.10  3.00 - 10.60 %    Lab Results  Component Value Date   WBC 5.1 03/07/2014   HGB 11.9* 03/07/2014   HCT 34.7* 03/07/2014   MCV 87.6 03/07/2014   PLT 169 03/07/2014   ASSESSMENT & PLAN:  Anemia, unspecified This is likely anemia of chronic disease. The patient denies recent history of bleeding such as epistaxis, hematuria or hematochezia. He is asymptomatic from the anemia. We will observe for now.   CKD (chronic kidney disease) This is due to membranous glomerulonephritis. He will continue followup with his nephrologist.  ITP (idiopathic thrombocytopenic purpura) This has not recurred. As long as he stays on 5 mg prednisone, his platelet count remain normal. I would discharge patient from the clinic.   All questions were answered. The patient knows to call the clinic with any problems, questions or concerns. No barriers to learning was detected.  I spent 15 minutes counseling the patient face to face. The total time spent in the appointment was 20 minutes and more than 50% was on counseling.     Heath Lark, MD 03/07/2014 2:44 PM

## 2016-11-17 ENCOUNTER — Encounter (HOSPITAL_COMMUNITY)
Admission: EM | Disposition: A | Discharge status: Home / Self Care | Payer: Self-pay | Source: Home / Self Care | Attending: Neurological Surgery

## 2016-11-17 ENCOUNTER — Emergency Department (HOSPITAL_COMMUNITY): Payer: BLUE CROSS/BLUE SHIELD

## 2016-11-17 ENCOUNTER — Emergency Department (HOSPITAL_COMMUNITY): Payer: BLUE CROSS/BLUE SHIELD | Admitting: Anesthesiology

## 2016-11-17 ENCOUNTER — Inpatient Hospital Stay (HOSPITAL_COMMUNITY)
Admission: EM | Admit: 2016-11-17 | Discharge: 2016-11-23 | DRG: 853 | Disposition: A | Discharge status: Home / Self Care | Payer: BLUE CROSS/BLUE SHIELD | Attending: Neurological Surgery | Admitting: Neurological Surgery

## 2016-11-17 ENCOUNTER — Encounter (HOSPITAL_COMMUNITY): Payer: Self-pay | Admitting: Emergency Medicine

## 2016-11-17 DIAGNOSIS — M4802 Spinal stenosis, cervical region: Secondary | ICD-10-CM | POA: Diagnosis present

## 2016-11-17 DIAGNOSIS — R739 Hyperglycemia, unspecified: Secondary | ICD-10-CM | POA: Diagnosis present

## 2016-11-17 DIAGNOSIS — Z87441 Personal history of nephrotic syndrome: Secondary | ICD-10-CM | POA: Diagnosis not present

## 2016-11-17 DIAGNOSIS — N184 Chronic kidney disease, stage 4 (severe): Secondary | ICD-10-CM

## 2016-11-17 DIAGNOSIS — Z880 Allergy status to penicillin: Secondary | ICD-10-CM | POA: Diagnosis not present

## 2016-11-17 DIAGNOSIS — E871 Hypo-osmolality and hyponatremia: Secondary | ICD-10-CM | POA: Diagnosis present

## 2016-11-17 DIAGNOSIS — M2578 Osteophyte, vertebrae: Secondary | ICD-10-CM | POA: Diagnosis present

## 2016-11-17 DIAGNOSIS — D649 Anemia, unspecified: Secondary | ICD-10-CM | POA: Diagnosis present

## 2016-11-17 DIAGNOSIS — R7881 Bacteremia: Secondary | ICD-10-CM | POA: Diagnosis not present

## 2016-11-17 DIAGNOSIS — Z87891 Personal history of nicotine dependence: Secondary | ICD-10-CM

## 2016-11-17 DIAGNOSIS — N049 Nephrotic syndrome with unspecified morphologic changes: Secondary | ICD-10-CM

## 2016-11-17 DIAGNOSIS — G061 Intraspinal abscess and granuloma: Secondary | ICD-10-CM | POA: Diagnosis present

## 2016-11-17 DIAGNOSIS — Z8042 Family history of malignant neoplasm of prostate: Secondary | ICD-10-CM | POA: Diagnosis not present

## 2016-11-17 DIAGNOSIS — D6959 Other secondary thrombocytopenia: Secondary | ICD-10-CM

## 2016-11-17 DIAGNOSIS — M542 Cervicalgia: Secondary | ICD-10-CM | POA: Diagnosis present

## 2016-11-17 DIAGNOSIS — I129 Hypertensive chronic kidney disease with stage 1 through stage 4 chronic kidney disease, or unspecified chronic kidney disease: Secondary | ICD-10-CM | POA: Diagnosis present

## 2016-11-17 DIAGNOSIS — Z7952 Long term (current) use of systemic steroids: Secondary | ICD-10-CM

## 2016-11-17 DIAGNOSIS — G062 Extradural and subdural abscess, unspecified: Secondary | ICD-10-CM | POA: Diagnosis not present

## 2016-11-17 DIAGNOSIS — A4901 Methicillin susceptible Staphylococcus aureus infection, unspecified site: Secondary | ICD-10-CM | POA: Diagnosis present

## 2016-11-17 DIAGNOSIS — D693 Immune thrombocytopenic purpura: Secondary | ICD-10-CM | POA: Diagnosis present

## 2016-11-17 DIAGNOSIS — Z419 Encounter for procedure for purposes other than remedying health state, unspecified: Secondary | ICD-10-CM

## 2016-11-17 DIAGNOSIS — R21 Rash and other nonspecific skin eruption: Secondary | ICD-10-CM | POA: Diagnosis present

## 2016-11-17 DIAGNOSIS — Z888 Allergy status to other drugs, medicaments and biological substances status: Secondary | ICD-10-CM | POA: Diagnosis not present

## 2016-11-17 DIAGNOSIS — Y92230 Patient room in hospital as the place of occurrence of the external cause: Secondary | ICD-10-CM | POA: Diagnosis not present

## 2016-11-17 DIAGNOSIS — W19XXXA Unspecified fall, initial encounter: Secondary | ICD-10-CM | POA: Diagnosis present

## 2016-11-17 DIAGNOSIS — A4101 Sepsis due to Methicillin susceptible Staphylococcus aureus: Secondary | ICD-10-CM | POA: Diagnosis not present

## 2016-11-17 DIAGNOSIS — R0902 Hypoxemia: Secondary | ICD-10-CM | POA: Diagnosis not present

## 2016-11-17 DIAGNOSIS — E785 Hyperlipidemia, unspecified: Secondary | ICD-10-CM | POA: Diagnosis present

## 2016-11-17 DIAGNOSIS — N052 Unspecified nephritic syndrome with diffuse membranous glomerulonephritis: Secondary | ICD-10-CM | POA: Diagnosis present

## 2016-11-17 DIAGNOSIS — R000 Tachycardia, unspecified: Secondary | ICD-10-CM | POA: Diagnosis present

## 2016-11-17 DIAGNOSIS — Z79899 Other long term (current) drug therapy: Secondary | ICD-10-CM | POA: Diagnosis not present

## 2016-11-17 DIAGNOSIS — N189 Chronic kidney disease, unspecified: Secondary | ICD-10-CM | POA: Diagnosis present

## 2016-11-17 DIAGNOSIS — R509 Fever, unspecified: Secondary | ICD-10-CM

## 2016-11-17 DIAGNOSIS — A419 Sepsis, unspecified organism: Secondary | ICD-10-CM | POA: Diagnosis present

## 2016-11-17 DIAGNOSIS — Z9181 History of falling: Secondary | ICD-10-CM | POA: Diagnosis not present

## 2016-11-17 DIAGNOSIS — Z9689 Presence of other specified functional implants: Secondary | ICD-10-CM | POA: Diagnosis not present

## 2016-11-17 DIAGNOSIS — T360X5A Adverse effect of penicillins, initial encounter: Secondary | ICD-10-CM | POA: Diagnosis not present

## 2016-11-17 DIAGNOSIS — B9561 Methicillin susceptible Staphylococcus aureus infection as the cause of diseases classified elsewhere: Secondary | ICD-10-CM | POA: Diagnosis not present

## 2016-11-17 DIAGNOSIS — Z9889 Other specified postprocedural states: Secondary | ICD-10-CM | POA: Diagnosis not present

## 2016-11-17 HISTORY — PX: ANTERIOR CERVICAL DECOMPRESSION FOR EPIDURAL ABSCESS: SHX5719

## 2016-11-17 LAB — CBC WITH DIFFERENTIAL/PLATELET
Basophils Absolute: 0 10*3/uL (ref 0.0–0.1)
Basophils Relative: 0 %
EOS ABS: 0 10*3/uL (ref 0.0–0.7)
EOS PCT: 0 %
HCT: 34.3 % — ABNORMAL LOW (ref 39.0–52.0)
HEMOGLOBIN: 12.2 g/dL — AB (ref 13.0–17.0)
LYMPHS PCT: 4 %
Lymphs Abs: 0.5 10*3/uL — ABNORMAL LOW (ref 0.7–4.0)
MCH: 30.7 pg (ref 26.0–34.0)
MCHC: 35.6 g/dL (ref 30.0–36.0)
MCV: 86.4 fL (ref 78.0–100.0)
MONOS PCT: 5 %
Monocytes Absolute: 0.6 10*3/uL (ref 0.1–1.0)
NEUTROS PCT: 91 %
Neutro Abs: 10.9 10*3/uL — ABNORMAL HIGH (ref 1.7–7.7)
Platelets: 126 10*3/uL — ABNORMAL LOW (ref 150–400)
RBC: 3.97 MIL/uL — AB (ref 4.22–5.81)
RDW: 13.1 % (ref 11.5–15.5)
WBC: 11.9 10*3/uL — AB (ref 4.0–10.5)

## 2016-11-17 LAB — I-STAT CHEM 8, ED
BUN: 26 mg/dL — ABNORMAL HIGH (ref 6–20)
CHLORIDE: 95 mmol/L — AB (ref 101–111)
Calcium, Ion: 1 mmol/L — ABNORMAL LOW (ref 1.15–1.40)
Creatinine, Ser: 1.7 mg/dL — ABNORMAL HIGH (ref 0.61–1.24)
Glucose, Bld: 170 mg/dL — ABNORMAL HIGH (ref 65–99)
HEMATOCRIT: 37 % — AB (ref 39.0–52.0)
HEMOGLOBIN: 12.6 g/dL — AB (ref 13.0–17.0)
Potassium: 3.8 mmol/L (ref 3.5–5.1)
Sodium: 130 mmol/L — ABNORMAL LOW (ref 135–145)
TCO2: 24 mmol/L (ref 0–100)

## 2016-11-17 LAB — I-STAT CG4 LACTIC ACID, ED: LACTIC ACID, VENOUS: 2.8 mmol/L — AB (ref 0.5–1.9)

## 2016-11-17 LAB — COMPREHENSIVE METABOLIC PANEL
ALBUMIN: 3.3 g/dL — AB (ref 3.5–5.0)
ALT: 13 U/L — ABNORMAL LOW (ref 17–63)
AST: 18 U/L (ref 15–41)
Alkaline Phosphatase: 37 U/L — ABNORMAL LOW (ref 38–126)
Anion gap: 11 (ref 5–15)
BUN: 26 mg/dL — ABNORMAL HIGH (ref 6–20)
CO2: 24 mmol/L (ref 22–32)
Calcium: 8.4 mg/dL — ABNORMAL LOW (ref 8.9–10.3)
Chloride: 96 mmol/L — ABNORMAL LOW (ref 101–111)
Creatinine, Ser: 1.81 mg/dL — ABNORMAL HIGH (ref 0.61–1.24)
GFR calc non Af Amer: 38 mL/min — ABNORMAL LOW (ref >60)
GFR, EST AFRICAN AMERICAN: 45 mL/min — AB (ref >60)
Glucose, Bld: 168 mg/dL — ABNORMAL HIGH (ref 65–99)
POTASSIUM: 3.7 mmol/L (ref 3.5–5.1)
Sodium: 131 mmol/L — ABNORMAL LOW (ref 135–145)
Total Bilirubin: 1.3 mg/dL — ABNORMAL HIGH (ref 0.3–1.2)
Total Protein: 6.9 g/dL (ref 6.5–8.1)

## 2016-11-17 LAB — TYPE AND SCREEN
ABO/RH(D): A POS
Antibody Screen: NEGATIVE

## 2016-11-17 LAB — SURGICAL PCR SCREEN
MRSA, PCR: NEGATIVE
Staphylococcus aureus: POSITIVE — AB

## 2016-11-17 LAB — ABO/RH: ABO/RH(D): A POS

## 2016-11-17 SURGERY — ANTERIOR CERVICAL DECOMPRESSION FOR EPIDURAL ABSCESS
Anesthesia: General | Site: Spine Cervical

## 2016-11-17 MED ORDER — MORPHINE SULFATE (PF) 2 MG/ML IV SOLN: 2.0000 mg | INTRAVENOUS | Status: DC | PRN

## 2016-11-17 MED ORDER — LACTATED RINGERS IV SOLN
INTRAVENOUS | Status: DC | PRN
  Administered 2016-11-17: 15:00:00 via INTRAVENOUS

## 2016-11-17 MED ORDER — ACETAMINOPHEN 650 MG RE SUPP: 650.0000 mg | RECTAL | Status: DC | PRN

## 2016-11-17 MED ORDER — VANCOMYCIN HCL IN DEXTROSE 1-5 GM/200ML-% IV SOLN
INTRAVENOUS | Status: AC
  Filled 2016-11-17: qty 200

## 2016-11-17 MED ORDER — OXYCODONE HCL 5 MG PO TABS
5.0000 mg | ORAL_TABLET | ORAL | Status: DC | PRN
  Administered 2016-11-17: 5 mg via ORAL
  Administered 2016-11-20 – 2016-11-21 (×4): 10 mg via ORAL
  Filled 2016-11-17 (×6): qty 2

## 2016-11-17 MED ORDER — SODIUM CHLORIDE 0.9 % IV SOLN
INTRAVENOUS | Status: DC
  Administered 2016-11-17: 13:00:00 via INTRAVENOUS

## 2016-11-17 MED ORDER — VANCOMYCIN HCL IN DEXTROSE 750-5 MG/150ML-% IV SOLN
750.0000 mg | Freq: Two times a day (BID) | INTRAVENOUS | Status: DC
  Administered 2016-11-18: 750 mg via INTRAVENOUS
  Filled 2016-11-17 (×2): qty 150

## 2016-11-17 MED ORDER — SODIUM CHLORIDE 0.9% FLUSH: 3.0000 mL | INTRAVENOUS | Status: DC | PRN

## 2016-11-17 MED ORDER — METHOCARBAMOL 500 MG PO TABS
500.0000 mg | ORAL_TABLET | Freq: Four times a day (QID) | ORAL | Status: DC | PRN
  Administered 2016-11-20 – 2016-11-21 (×2): 500 mg via ORAL
  Filled 2016-11-17 (×3): qty 1

## 2016-11-17 MED ORDER — MIDAZOLAM HCL 2 MG/2ML IJ SOLN
INTRAMUSCULAR | Status: AC
  Filled 2016-11-17: qty 2

## 2016-11-17 MED ORDER — SODIUM CHLORIDE 0.9 % IV BOLUS (SEPSIS)
30.0000 mL/kg | Freq: Once | INTRAVENOUS | Status: AC
  Administered 2016-11-17: 2340 mL via INTRAVENOUS

## 2016-11-17 MED ORDER — PHENOL 1.4 % MT LIQD: 1.0000 | OROMUCOSAL | Status: DC | PRN

## 2016-11-17 MED ORDER — GADOBENATE DIMEGLUMINE 529 MG/ML IV SOLN
10.0000 mL | Freq: Once | INTRAVENOUS | Status: AC | PRN
  Administered 2016-11-17: 8 mL via INTRAVENOUS

## 2016-11-17 MED ORDER — SUGAMMADEX SODIUM 200 MG/2ML IV SOLN
INTRAVENOUS | Status: AC
  Filled 2016-11-17: qty 2

## 2016-11-17 MED ORDER — DEXAMETHASONE SODIUM PHOSPHATE 4 MG/ML IJ SOLN
4.0000 mg | Freq: Four times a day (QID) | INTRAMUSCULAR | Status: AC
  Administered 2016-11-17 – 2016-11-18 (×4): 4 mg via INTRAVENOUS
  Filled 2016-11-17 (×4): qty 1

## 2016-11-17 MED ORDER — CALCIUM CARBONATE 600 MG PO TABS: 600.0000 mg | ORAL_TABLET | Freq: Every day | ORAL | Status: DC

## 2016-11-17 MED ORDER — MUPIROCIN 2 % EX OINT
1.0000 "application " | TOPICAL_OINTMENT | Freq: Once | CUTANEOUS | Status: AC
  Administered 2016-11-17: 1 via TOPICAL
  Filled 2016-11-17: qty 22

## 2016-11-17 MED ORDER — ACETAMINOPHEN 325 MG PO TABS
650.0000 mg | ORAL_TABLET | Freq: Once | ORAL | Status: AC | PRN
  Administered 2016-11-17: 650 mg via ORAL
  Filled 2016-11-17: qty 2

## 2016-11-17 MED ORDER — SUGAMMADEX SODIUM 200 MG/2ML IV SOLN
INTRAVENOUS | Status: DC | PRN
  Administered 2016-11-17: 200 mg via INTRAVENOUS

## 2016-11-17 MED ORDER — ACETAMINOPHEN 325 MG PO TABS
650.0000 mg | ORAL_TABLET | ORAL | Status: DC | PRN
  Administered 2016-11-20: 650 mg via ORAL
  Filled 2016-11-17: qty 2

## 2016-11-17 MED ORDER — BUPIVACAINE HCL (PF) 0.25 % IJ SOLN
INTRAMUSCULAR | Status: AC
  Filled 2016-11-17: qty 30

## 2016-11-17 MED ORDER — PROPOFOL 10 MG/ML IV BOLUS
INTRAVENOUS | Status: DC | PRN
  Administered 2016-11-17: 150 mg via INTRAVENOUS

## 2016-11-17 MED ORDER — 0.9 % SODIUM CHLORIDE (POUR BTL) OPTIME
TOPICAL | Status: DC | PRN
  Administered 2016-11-17: 1000 mL

## 2016-11-17 MED ORDER — PHENYLEPHRINE HCL 10 MG/ML IJ SOLN
INTRAVENOUS | Status: DC | PRN
  Administered 2016-11-17: 40 ug/min via INTRAVENOUS

## 2016-11-17 MED ORDER — PREDNISONE 5 MG PO TABS
5.0000 mg | ORAL_TABLET | Freq: Every day | ORAL | Status: DC
  Filled 2016-11-17: qty 1

## 2016-11-17 MED ORDER — SENNA 8.6 MG PO TABS
1.0000 | ORAL_TABLET | Freq: Two times a day (BID) | ORAL | Status: DC
  Administered 2016-11-18 – 2016-11-22 (×7): 8.6 mg via ORAL
  Filled 2016-11-17 (×10): qty 1

## 2016-11-17 MED ORDER — PROPOFOL 10 MG/ML IV BOLUS
INTRAVENOUS | Status: AC
  Filled 2016-11-17: qty 20

## 2016-11-17 MED ORDER — LIDOCAINE 2% (20 MG/ML) 5 ML SYRINGE
INTRAMUSCULAR | Status: AC
  Filled 2016-11-17: qty 5

## 2016-11-17 MED ORDER — ROCURONIUM BROMIDE 100 MG/10ML IV SOLN
INTRAVENOUS | Status: DC | PRN
  Administered 2016-11-17: 50 mg via INTRAVENOUS

## 2016-11-17 MED ORDER — POTASSIUM CHLORIDE IN NACL 20-0.9 MEQ/L-% IV SOLN
INTRAVENOUS | Status: DC
  Administered 2016-11-17 – 2016-11-18 (×3): via INTRAVENOUS
  Filled 2016-11-17 (×4): qty 1000

## 2016-11-17 MED ORDER — CALCIUM CARBONATE 1250 (500 CA) MG PO TABS
1.0000 | ORAL_TABLET | Freq: Every day | ORAL | Status: DC
  Administered 2016-11-18 – 2016-11-23 (×6): 500 mg via ORAL
  Filled 2016-11-17 (×7): qty 1

## 2016-11-17 MED ORDER — THROMBIN 5000 UNITS EX SOLR
OROMUCOSAL | Status: DC | PRN
  Administered 2016-11-17: 16:00:00 via TOPICAL

## 2016-11-17 MED ORDER — ACETAMINOPHEN 650 MG RE SUPP: 650.0000 mg | Freq: Once | RECTAL | Status: DC

## 2016-11-17 MED ORDER — ONDANSETRON HCL 4 MG/2ML IJ SOLN: 4.0000 mg | Freq: Once | INTRAMUSCULAR | Status: DC | PRN

## 2016-11-17 MED ORDER — METHOCARBAMOL 1000 MG/10ML IJ SOLN
500.0000 mg | Freq: Four times a day (QID) | INTRAMUSCULAR | Status: DC | PRN
  Filled 2016-11-17: qty 5

## 2016-11-17 MED ORDER — ACETAMINOPHEN 650 MG RE SUPP: 650.0000 mg | Freq: Four times a day (QID) | RECTAL | Status: DC | PRN

## 2016-11-17 MED ORDER — ONDANSETRON HCL 4 MG/2ML IJ SOLN: 4.0000 mg | INTRAMUSCULAR | Status: DC | PRN

## 2016-11-17 MED ORDER — PHENYLEPHRINE HCL 10 MG/ML IJ SOLN
INTRAMUSCULAR | Status: DC | PRN
  Administered 2016-11-17 (×2): 80 ug via INTRAVENOUS

## 2016-11-17 MED ORDER — ONDANSETRON HCL 4 MG/2ML IJ SOLN
INTRAMUSCULAR | Status: AC
  Filled 2016-11-17: qty 2

## 2016-11-17 MED ORDER — ROCURONIUM BROMIDE 50 MG/5ML IV SOSY
PREFILLED_SYRINGE | INTRAVENOUS | Status: AC
  Filled 2016-11-17: qty 5

## 2016-11-17 MED ORDER — SODIUM CHLORIDE 0.9% FLUSH
3.0000 mL | Freq: Two times a day (BID) | INTRAVENOUS | Status: DC
  Administered 2016-11-17: 3 mL via INTRAVENOUS
  Administered 2016-11-18: 10 mL via INTRAVENOUS
  Administered 2016-11-18 – 2016-11-23 (×9): 3 mL via INTRAVENOUS

## 2016-11-17 MED ORDER — MENTHOL 3 MG MT LOZG: 1.0000 | LOZENGE | OROMUCOSAL | Status: DC | PRN

## 2016-11-17 MED ORDER — FENTANYL CITRATE (PF) 100 MCG/2ML IJ SOLN
25.0000 ug | Freq: Once | INTRAMUSCULAR | Status: AC
  Administered 2016-11-17: 25 ug via INTRAVENOUS
  Filled 2016-11-17: qty 2

## 2016-11-17 MED ORDER — SODIUM CHLORIDE 0.9 % IR SOLN
Status: DC | PRN
  Administered 2016-11-17: 16:00:00

## 2016-11-17 MED ORDER — SODIUM CHLORIDE 0.9 % IV BOLUS (SEPSIS)
1000.0000 mL | Freq: Once | INTRAVENOUS | Status: AC
  Administered 2016-11-17: 1000 mL via INTRAVENOUS

## 2016-11-17 MED ORDER — LIDOCAINE HCL (CARDIAC) 20 MG/ML IV SOLN
INTRAVENOUS | Status: DC | PRN
Start: 2016-11-17 — End: 2016-11-17
  Administered 2016-11-17: 80 mg via INTRAVENOUS

## 2016-11-17 MED ORDER — LORAZEPAM 2 MG/ML IJ SOLN
0.5000 mg | Freq: Once | INTRAMUSCULAR | Status: AC
  Administered 2016-11-17: 0.5 mg via INTRAVENOUS
  Filled 2016-11-17: qty 1

## 2016-11-17 MED ORDER — ENALAPRIL MALEATE 5 MG PO TABS
10.0000 mg | ORAL_TABLET | Freq: Every day | ORAL | Status: DC
  Administered 2016-11-18 – 2016-11-23 (×6): 10 mg via ORAL
  Filled 2016-11-17 (×4): qty 2
  Filled 2016-11-17: qty 1
  Filled 2016-11-17: qty 2

## 2016-11-17 MED ORDER — ACETAMINOPHEN 325 MG PO TABS: 650.0000 mg | ORAL_TABLET | Freq: Four times a day (QID) | ORAL | Status: DC | PRN

## 2016-11-17 MED ORDER — FENTANYL CITRATE (PF) 100 MCG/2ML IJ SOLN
INTRAMUSCULAR | Status: DC | PRN
  Administered 2016-11-17 (×2): 100 ug via INTRAVENOUS

## 2016-11-17 MED ORDER — SURGIFOAM 100 EX MISC
CUTANEOUS | Status: DC | PRN
  Administered 2016-11-17: 16:00:00 via TOPICAL

## 2016-11-17 MED ORDER — ONDANSETRON HCL 4 MG/2ML IJ SOLN
INTRAMUSCULAR | Status: DC | PRN
  Administered 2016-11-17: 4 mg via INTRAVENOUS

## 2016-11-17 MED ORDER — SODIUM CHLORIDE 0.9 % IV SOLN
250.0000 mL | INTRAVENOUS | Status: DC
  Administered 2016-11-17: 250 mL via INTRAVENOUS

## 2016-11-17 MED ORDER — VANCOMYCIN HCL 1000 MG IV SOLR
INTRAVENOUS | Status: DC | PRN
  Administered 2016-11-17: 1000 mg via INTRAVENOUS

## 2016-11-17 MED ORDER — DEXTROSE 5 % IV SOLN
2.0000 g | Freq: Three times a day (TID) | INTRAVENOUS | Status: DC
  Administered 2016-11-17 – 2016-11-18 (×2): 2 g via INTRAVENOUS
  Filled 2016-11-17 (×3): qty 2

## 2016-11-17 MED ORDER — PHENYLEPHRINE 40 MCG/ML (10ML) SYRINGE FOR IV PUSH (FOR BLOOD PRESSURE SUPPORT)
PREFILLED_SYRINGE | INTRAVENOUS | Status: AC
  Filled 2016-11-17: qty 10

## 2016-11-17 MED ORDER — FENTANYL CITRATE (PF) 100 MCG/2ML IJ SOLN
INTRAMUSCULAR | Status: AC
  Filled 2016-11-17: qty 4

## 2016-11-17 MED ORDER — BUPIVACAINE HCL (PF) 0.25 % IJ SOLN
INTRAMUSCULAR | Status: DC | PRN
  Administered 2016-11-17: 3 mL

## 2016-11-17 MED ORDER — DEXAMETHASONE SODIUM PHOSPHATE 10 MG/ML IJ SOLN
INTRAMUSCULAR | Status: AC
  Filled 2016-11-17: qty 1

## 2016-11-17 MED ORDER — HYDROCORTISONE NA SUCCINATE PF 100 MG IJ SOLR
100.0000 mg | Freq: Once | INTRAMUSCULAR | Status: AC
  Administered 2016-11-17: 100 mg via INTRAVENOUS
  Filled 2016-11-17: qty 2

## 2016-11-17 MED ORDER — DEXAMETHASONE SODIUM PHOSPHATE 4 MG/ML IJ SOLN
INTRAMUSCULAR | Status: DC | PRN
  Administered 2016-11-17: 10 mg via INTRAVENOUS

## 2016-11-17 MED ORDER — THROMBIN 5000 UNITS EX SOLR
CUTANEOUS | Status: AC
  Filled 2016-11-17: qty 15000

## 2016-11-17 MED ORDER — FENTANYL CITRATE (PF) 100 MCG/2ML IJ SOLN: 25.0000 ug | INTRAMUSCULAR | Status: DC | PRN

## 2016-11-17 SURGICAL SUPPLY — 60 items
ALLOGRAFT LORDOTIC CC 7X11X14 (Bone Implant) ×3 IMPLANT
APL SKNCLS STERI-STRIP NONHPOA (GAUZE/BANDAGES/DRESSINGS) ×2
BAG DECANTER FOR FLEXI CONT (MISCELLANEOUS) ×8 IMPLANT
BASKET BONE COLLECTION (BASKET) IMPLANT
BENZOIN TINCTURE PRP APPL 2/3 (GAUZE/BANDAGES/DRESSINGS) ×5 IMPLANT
BIT DRILL POWER (BIT) ×1 IMPLANT
BLADE CLIPPER SURG (BLADE) IMPLANT
BUR MATCHSTICK NEURO 3.0 LAGG (BURR) ×4 IMPLANT
CANISTER SUCT 3000ML PPV (MISCELLANEOUS) ×8 IMPLANT
CARTRIDGE OIL MAESTRO DRILL (MISCELLANEOUS) ×4 IMPLANT
CLOSURE WOUND 1/2 X4 (GAUZE/BANDAGES/DRESSINGS) ×1
DIFFUSER DRILL AIR PNEUMATIC (MISCELLANEOUS) ×8 IMPLANT
DRAPE C-ARM 42X72 X-RAY (DRAPES) ×16 IMPLANT
DRAPE LAPAROTOMY 100X72 PEDS (DRAPES) ×8 IMPLANT
DRAPE MICROSCOPE LEICA (MISCELLANEOUS) ×4 IMPLANT
DRAPE POUCH INSTRU U-SHP 10X18 (DRAPES) ×8 IMPLANT
DRILL BIT POWER (BIT) ×4
DRSG OPSITE 4X5.5 SM (GAUZE/BANDAGES/DRESSINGS) ×3 IMPLANT
DURAPREP 26ML APPLICATOR (WOUND CARE) ×4 IMPLANT
DURAPREP 6ML APPLICATOR 50/CS (WOUND CARE) ×4 IMPLANT
ELECT COATED BLADE 2.86 ST (ELECTRODE) ×4 IMPLANT
ELECT REM PT RETURN 9FT ADLT (ELECTROSURGICAL) ×8
ELECTRODE REM PT RTRN 9FT ADLT (ELECTROSURGICAL) ×4 IMPLANT
EVACUATOR 1/8 PVC DRAIN (DRAIN) IMPLANT
GAUZE SPONGE 4X4 16PLY XRAY LF (GAUZE/BANDAGES/DRESSINGS) IMPLANT
GLOVE BIO SURGEON STRL SZ8 (GLOVE) ×8 IMPLANT
GOWN STRL REUS W/ TWL LRG LVL3 (GOWN DISPOSABLE) IMPLANT
GOWN STRL REUS W/ TWL XL LVL3 (GOWN DISPOSABLE) ×4 IMPLANT
GOWN STRL REUS W/TWL 2XL LVL3 (GOWN DISPOSABLE) IMPLANT
GOWN STRL REUS W/TWL LRG LVL3 (GOWN DISPOSABLE)
GOWN STRL REUS W/TWL XL LVL3 (GOWN DISPOSABLE) ×8
HEMOSTAT POWDER KIT SURGIFOAM (HEMOSTASIS) ×4 IMPLANT
KIT BASIN OR (CUSTOM PROCEDURE TRAY) ×8 IMPLANT
KIT ROOM TURNOVER OR (KITS) ×8 IMPLANT
MARKER SKIN DUAL TIP RULER LAB (MISCELLANEOUS) ×4 IMPLANT
NDL HYPO 25X1 1.5 SAFETY (NEEDLE) ×2 IMPLANT
NDL SPNL 20GX3.5 QUINCKE YW (NEEDLE) ×1 IMPLANT
NEEDLE HYPO 25X1 1.5 SAFETY (NEEDLE) ×8 IMPLANT
NEEDLE SPNL 20GX3.5 QUINCKE YW (NEEDLE) ×4 IMPLANT
NS IRRIG 1000ML POUR BTL (IV SOLUTION) ×8 IMPLANT
OIL CARTRIDGE MAESTRO DRILL (MISCELLANEOUS) ×8
PACK LAMINECTOMY NEURO (CUSTOM PROCEDURE TRAY) ×8 IMPLANT
PAD ARMBOARD 7.5X6 YLW CONV (MISCELLANEOUS) ×16 IMPLANT
PIN DISTRACTION 14MM (PIN) ×8 IMPLANT
PIN MAYFIELD SKULL DISP (PIN) ×4 IMPLANT
PLATE ARCHON 24MM 1LVL (Plate) ×3 IMPLANT
RUBBERBAND STERILE (MISCELLANEOUS) ×8 IMPLANT
SCREW ARCHON SELFTAP 4.0X15MM (Screw) ×12 IMPLANT
SPONGE INTESTINAL PEANUT (DISPOSABLE) ×4 IMPLANT
SPONGE LAP 4X18 X RAY DECT (DISPOSABLE) IMPLANT
SPONGE SURGIFOAM ABS GEL 100 (HEMOSTASIS) ×4 IMPLANT
SPONGE SURGIFOAM ABS GEL SZ50 (HEMOSTASIS) ×4 IMPLANT
STRIP CLOSURE SKIN 1/2X4 (GAUZE/BANDAGES/DRESSINGS) ×4 IMPLANT
SUT VIC AB 0 CT1 18XCR BRD8 (SUTURE) ×2 IMPLANT
SUT VIC AB 0 CT1 8-18 (SUTURE) ×4
SUT VIC AB 2-0 CP2 18 (SUTURE) ×4 IMPLANT
SUT VIC AB 3-0 SH 8-18 (SUTURE) ×12 IMPLANT
TOWEL OR 17X24 6PK STRL BLUE (TOWEL DISPOSABLE) ×8 IMPLANT
TOWEL OR 17X26 10 PK STRL BLUE (TOWEL DISPOSABLE) ×8 IMPLANT
WATER STERILE IRR 1000ML POUR (IV SOLUTION) ×8 IMPLANT

## 2016-11-17 NOTE — H&P (Signed)
Subjective:   Patient is a 61 y.o. male admitted for Neck pain with a fall this morning. The patient first presented with complaints of neck pain and loss of strength of the arm(s). Patient fell this morning. His left leg is weak. His hands are weak. He has had significant pain and fever and generalized malaise. Onset of symptoms was 3 days ago. The pain is described as aching and occurs all day. The pain is rated intense, and is located in the neck and radiates to the arms.. The symptoms have been progressive. Symptoms are exacerbated by none, and are relieved by none.  Previous work up includes MRI of cervical spine, results: Suspected epidural abscess at C6-7. He denies recent infections such as skin infection or tooth infection. He is on chronic prednisone for glomerulonephritis.  Past Medical History:  Diagnosis Date  . Anemia, unspecified 09/06/2013  . CKD (chronic kidney disease)    on chronic low dose Prednisone  . HTN (hypertension)   . Hyperlipidemia   . ITP (idiopathic thrombocytopenic purpura) 2011   on chronic low dose Prednisone  . 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 HERNIA REPAIR    . kidney biospy      Allergies  Allergen Reactions  . Penicillins     Social History  Substance Use Topics  . Smoking status: Former Smoker    Packs/day: 1.00    Years: 30.00    Quit date: 09/06/2010  . Smokeless tobacco: Never Used  . Alcohol use No    Family History  Problem Relation Age of Onset  . Cancer Father     prostate ca   Prior to Admission medications   Medication Sig Start Date End Date Taking? Authorizing Provider  atorvastatin (LIPITOR) 10 MG tablet Take 10 mg by mouth daily. 01/07/13   Historical Provider, MD  calcium carbonate (OS-CAL) 600 MG TABS Take 600 mg by mouth daily.    Historical Provider, MD  enalapril (VASOTEC) 10 MG tablet Take 10 mg by mouth daily.  12/02/11   Historical Provider, MD  predniSONE  (DELTASONE) 5 MG tablet Take 1 tablet (5 mg total) by mouth daily. 01/31/12   Nobie Putnam, MD     Review of Systems  Positive ROS: As above  All other systems have been reviewed and were otherwise negative with the exception of those mentioned in the HPI and as above.  Objective: Vital signs in last 24 hours: Temp:  [99.3 F (37.4 C)-103.1 F (39.5 C)] 99.3 F (37.4 C) (02/15 1103) Pulse Rate:  [100-110] 100 (02/15 1100) Resp:  [16-22] 22 (02/15 1100) BP: (102-104)/(64-70) 102/70 (02/15 1100) SpO2:  [93 %-94 %] 94 % (02/15 1100) Weight:  [78 kg (172 lb)] 78 kg (172 lb) (02/15 0842)  General Appearance: Alert, cooperative, appears somewhat ill, appears stated age Head: Normocephalic, without obvious abnormality, atraumatic Eyes: PERRL, conjunctiva/corneas clear, EOM's intact     Lungs:  respirations unlabored Heart: Regular rate and rhythm Abdomen: Soft, non-tender Extremities: Extremities normal, atraumatic, no cyanosis or edema Pulses: 2+ and symmetric all extremities Skin: Skin color, texture, turgor normal, no rashes or lesions  NEUROLOGIC:  Mental status: Alert and oriented x4, no aphasia, good attention span, fund of knowledge and memory  Motor Exam -has weakness of hand grips and weakness of the left lower extremity measuring about 3 out of 5 Sensory Exam - grossly normal Reflexes: Normal Coordination - grossly normal Gait - not tested Balance -  not tested Cranial Nerves: I: smell Not tested  II: visual acuity  OS: nl    OD: nl  II: visual fields Full to confrontation  II: pupils Equal, round, reactive to light  III,VII: ptosis None  III,IV,VI: extraocular muscles  Full ROM  V: mastication Normal  V: facial light touch sensation  Normal  V,VII: corneal reflex  Present  VII: facial muscle function - upper  Normal  VII: facial muscle function - lower Normal  VIII: hearing Not tested  IX: soft palate elevation  Normal  IX,X: gag reflex Present  XI: trapezius  strength  5/5  XI: sternocleidomastoid strength 5/5  XI: neck flexion strength  5/5  XII: tongue strength  Normal    Data Review Lab Results  Component Value Date   WBC 11.9 (H) 11/17/2016   HGB 12.6 (L) 11/17/2016   HCT 37.0 (L) 11/17/2016   MCV 86.4 11/17/2016   PLT 126 (L) 11/17/2016   Lab Results  Component Value Date   NA 130 (L) 11/17/2016   K 3.8 11/17/2016   CL 95 (L) 11/17/2016   CO2 24 11/17/2016   BUN 26 (H) 11/17/2016   CREATININE 1.70 (H) 11/17/2016   GLUCOSE 170 (H) 11/17/2016   No results found for: INR, PROTIME  Assessment:   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  Planned surgery : ACDF C6-7 for evacuation of epidural abscess. It is certainly possible he could need a corpectomy. It is possible he could need a posterior cervical decompression and instrumented fusion. All this is been discussed with the patient and his sister. Consent has been obtained. They understand the risk of the surgery include but are not limited to bleeding, infection, CSF leak, nerve injury, spinal cord injury, numbness, weakness, paralysis, lack of relief of symptoms, worsening symptoms, pseudoarthrosis, plate failure, need for further surgery, esophageal injury, tracheal injury, recurrent laryngeal nerve injury hoarseness, swallowing difficulty, carotid artery or vertebral artery injury with stroke, and anesthesia risk including DVT pneumonia MI and death. They agreed to proceed  Plan:   I explained the condition and procedure to the patient and answered any questions.  Patient wishes to proceed with procedure as planned. Understands risks/ benefits/ and expected or typical outcomes.  Zeth Buday S 11/17/2016 11:38 AM

## 2016-11-17 NOTE — ED Provider Notes (Signed)
[facility] DEPT Provider Note   CSN: [id number] Arrival date & time: 11/17/16  [id number]     History   Chief Complaint Chief Complaint  Patient presents with  . Fever  . Fall    HPI Thomas Nielsen is a 61 y.o. male.  The history is provided by the patient. No language interpreter was used.  Fever    Fall    Thomas Nielsen is a 61 y.o. male who presents to the Emergency Department complaining of fever, weakness.  For the last three days he has experienced pain in his lower neck, greater over the right with body aches, predominantly spreading to both shoulders and progressing over the last few days to include bilateral legs.  He reports subjective fevers and chills at home. He has increased pain with movement.  He planned to see his PCP today but was too weak and fell to the ground, landing on his tailbone.  He endorses mild headache.  No head injury or LOC.  He denies nasal congestion, cough, sob, abdominal pain, N/V, dysuria.  No known sick contacts, travel or epidural injections, no recent dental work.    Past Medical History:  Diagnosis Date  . Anemia, unspecified 09/06/2013  . CKD (chronic kidney disease)    on chronic low dose Prednisone  . HTN (hypertension)   . Hyperlipidemia   . ITP (idiopathic thrombocytopenic purpura) 2011   on chronic low dose Prednisone  . 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 Anemia, unspecified 09/06/2013  . 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 HTN (hypertension)   . Hyperlipidemia   . ITP (idiopathic thrombocytopenic purpura)   . CKD (chronic kidney disease)     Past Surgical History:  Procedure Laterality Date  . HERNIA REPAIR    . kidney biospy         Home Medications    Prior to Admission medications   Medication Sig Start Date End Date Taking? Authorizing Provider  atorvastatin (LIPITOR) 10 MG tablet Take 10 mg by mouth daily. 01/07/13   Historical Provider, MD   calcium carbonate (OS-CAL) 600 MG TABS Take 600 mg by mouth daily.    Historical Provider, MD  enalapril (VASOTEC) 10 MG tablet Take 10 mg by mouth daily.  12/02/11   Historical Provider, MD  predniSONE (DELTASONE) 5 MG tablet Take 1 tablet (5 mg total) by mouth daily. 01/31/12   Nobie Putnam, MD    Family History Family History  Problem Relation Age of Onset  . Cancer Father     prostate ca    Social History Social History  Substance Use Topics  . Smoking status: Former Smoker    Packs/day: 1.00    Years: 30.00    Quit date: 09/06/2010  . Smokeless tobacco: Never Used  . Alcohol use No     Allergies   Penicillins   Review of Systems Review of Systems  Constitutional: Positive for fever.  All other systems reviewed and are negative.    Physical Exam Updated Vital Signs BP 103/64 (BP Location: Left Arm)   Pulse 110   Temp (!) 103.1 F (39.5 C) (Oral)   Resp 16   SpO2 93% Comment: Simultaneous filing. User may not have seen previous data.  Physical Exam  Constitutional: He is oriented to person, place, and time. He appears well-developed and well-nourished. He appears distressed.  HENT:  Head: Normocephalic and atraumatic.  Neck:  Decreased ROM of neck  with decreased lateral rotation to right and left as well as decreased flexion/extension.   Cardiovascular: Regular rhythm.   No murmur heard. tachycardic  Pulmonary/Chest: Effort normal and breath sounds normal. No respiratory distress.  Abdominal: Soft. There is no tenderness. There is no rebound and no guarding.  Musculoskeletal: He exhibits no edema or tenderness.  Neurological: He is alert and oriented to person, place, and time. No cranial nerve deficit.  4/5 strength in BUE.  3/5 strength in LLE, 5/5 strength in RLE.    Skin: Skin is warm and dry.  Psychiatric: He has a normal mood and affect. His behavior is normal.  Nursing note and vitals reviewed.    ED Treatments / Results  Labs (all labs ordered  are listed, but only abnormal results are displayed) Labs Reviewed  CBC WITH DIFFERENTIAL/PLATELET - Abnormal; Notable for the following:       Result Value   WBC 11.9 (*)    RBC 3.97 (*)    Hemoglobin 12.2 (*)    HCT 34.3 (*)    Platelets 126 (*)    Neutro Abs 10.9 (*)    Lymphs Abs 0.5 (*)    All other components within normal limits  CULTURE, BLOOD (ROUTINE X 2)  CULTURE, BLOOD (ROUTINE X 2)  COMPREHENSIVE METABOLIC PANEL  URINALYSIS, ROUTINE W REFLEX MICROSCOPIC  I-STAT CG4 LACTIC ACID, ED  I-STAT CHEM 8, ED    EKG  EKG Interpretation None       Radiology No results found.  Procedures Procedures (including critical care time) CRITICAL CARE Performed by: Quintella Reichert   Total critical care time: 45 minutes  Critical care time was exclusive of separately billable procedures and treating other patients.  Critical care was necessary to treat or prevent imminent or life-threatening deterioration.  Critical care was time spent personally by me on the following activities: development of treatment plan with patient and/or surrogate as well as nursing, discussions with consultants, evaluation of patient's response to treatment, examination of patient, obtaining history from patient or surrogate, ordering and performing treatments and interventions, ordering and review of laboratory studies, ordering and review of radiographic studies, pulse oximetry and re-evaluation of patient's condition.   Medications Ordered in ED Medications  sodium chloride 0.9 % bolus 1,000 mL (not administered)  acetaminophen (TYLENOL) tablet 650 mg (650 mg Oral Given 11/17/16 0756)     Initial Impression / Assessment and Plan / ED Course  I have reviewed the triage vital signs and the nursing notes.  Pertinent labs & imaging results that were available during my care of the patient were reviewed by me and considered in my medical decision making (see chart for details).     Pt here with  fever, neck pain, weakness - concern for epidural abscess on presentation.  Given chronic steroid use he was given hydrocortisone as well as IVF hydration.  Antibiotics were with held after discussion with ID given importance of cultures from abscess site to guide appropriate treatment.    D/w Dr Ronnald Ramp with Neurosurgery - requests transfer to [facility] for emergent surgery with admission to critical care.  D/w Dr. Corrie Dandy with critical care - critical care service will see the patient following surgery.    Patient and sister updated of findings of studies and critical nature of illness and need for admission and surgery for further treatment.     Final Clinical Impressions(s) / ED Diagnoses   Final diagnoses:  None    New Prescriptions New Prescriptions  No medications on file     Quintella Reichert, MD 11/18/16 [phone]

## 2016-11-17 NOTE — Op Note (Signed)
11/17/2016  4:26 PM  PATIENT:  Thomas Nielsen  61 y.o. male  PRE-OPERATIVE DIAGNOSIS:  C6-7 epidural abscess  POST-OPERATIVE DIAGNOSIS:  same  PROCEDURE:  1. Decompressive anterior cervical discectomy C6-7 with evacuation of epidural abscess, 2. Anterior cervical arthrodesis C6-7 utilizing a cortical cancellus allograft, 3. Anterior cervical plating C6-7 utilizing a Nuvasive plate   SURGEON:  Sherley Bounds, MD  ASSISTANTSShelba Flake NP  ANESTHESIA:   General  EBL: 25 ml  Total I/O In: -  Out: 25 [Blood:25]  BLOOD ADMINISTERED: none  DRAINS: none  SPECIMEN:  none  INDICATION FOR PROCEDURE: This patient presented with severe neck pain with fever and weakness and a fall. Imaging showed a large epidural abscess C6-7. Marland Kitchen Pain was debilitating. Recommended ACDF with plating C6-7 with evacuation of abscess. Patient understood the risks, benefits, and alternatives and potential outcomes and wished to proceed.  PROCEDURE DETAILS: Patient was brought to the operating room placed under general endotracheal anesthesia. Patient was placed in the supine position on the operating room table. The neck was prepped with Duraprep and draped in a sterile fashion.   Three cc of local anesthesia was injected and a transverse incision was made on the right side of the neck.  Dissection was carried down thru the subcutaneous tissue and the platysma was  elevated, opened, and undermined with Metzenbaum scissors.  Dissection was then carried out thru an avascular plane leaving the sternocleidomastoid carotid artery and jugular vein laterally and the trachea and esophagus medially. The ventral aspect of the vertebral column was identified and a localizing x-ray was taken. The C6-7 level was identified. The longus colli muscles were then elevated and the retractor was placed. The annulus was incised and the disc space entered. The disc was obviously infected and there was pus. Disc was sent for culture. I sent pus from  the disc space for culture. Discectomy was performed with micro-curettes and pituitary rongeurs. I then used the high-speed drill to drill the endplates down to the level of the posterior longitudinal ligament. . The operating microscope was draped and brought into the field provided additional magnification, illumination and visualization. Large amounts of liquid pus were removed from the epidural space. The nerve hook was used to dissect up under the vertebral bodies of C7 and T1 to remove more liquid pus. Not try to open the posterior lateral ligament as I did not want to cause a CSF leak and possible meningitis. The tissues were quite friable.. We then continued to remove osteophytic overgrowth and disc material decompressing the neural foramina and exiting nerve roots bilaterally. The scope was angled up and down to help decompress and undercut the vertebral bodies. Once the decompression was completed we could pass a nerve hook circumferentially to assure adequate decompression in the midline and in the neural foramina. So by both visualization and palpation we felt we had an adequate decompression of the neural elements. We then measured the height of the intravertebral disc space and selected a 7 mm cortical cancellus allograft and tapped this into position at C6-7. I then used a 24 mm plate and placed 15 mm fixed angle screws into the vertebral bodies of each level and locked them into position. The wound was irrigated with bacitracin solution, checked for hemostasis which was established and confirmed. Once meticulous hemostasis was achieved, we then proceeded with closure. The platysma was closed with interrupted 3-0 undyed Vicryl suture, the subcuticular layer was closed with interrupted 3-0 undyed Vicryl suture. The skin edges  were approximated with steristrips. The drapes were removed. A sterile dressing was applied. The patient was then awakened from general anesthesia and transferred to the recovery  room in stable condition. At the end of the procedure all sponge, needle and instrument counts were correct.   PLAN OF CARE: Admit to inpatient   PATIENT DISPOSITION:  PACU - hemodynamically stable.   Delay start of Pharmacological VTE agent (>24hrs) due to surgical blood loss or risk of bleeding:  yes                  PLAN OF CARE: Admit to inpatient   PATIENT DISPOSITION:  PACU - hemodynamically stable.   Delay start of Pharmacological VTE agent (>24hrs) due to surgical blood loss or risk of bleeding:  yes

## 2016-11-17 NOTE — Anesthesia Procedure Notes (Signed)
Procedure Name: Intubation Date/Time: 11/17/2016 2:39 PM Performed by: Ollen Bowl Pre-anesthesia Checklist: Patient identified, Emergency Drugs available, Suction available, Patient being monitored and Timeout performed Patient Re-evaluated:Patient Re-evaluated prior to inductionOxygen Delivery Method: Circle system utilized and Simple face mask Preoxygenation: Pre-oxygenation with 100% oxygen Intubation Type: IV induction Ventilation: Mask ventilation with difficulty and Oral airway inserted - appropriate to patient size Laryngoscope Size: Glidescope Grade View: Grade I Tube type: Oral Tube size: 7.5 mm Number of attempts: 1 Airway Equipment and Method: Patient positioned with wedge pillow,  Stylet and Video-laryngoscopy Placement Confirmation: ETT inserted through vocal cords under direct vision,  positive ETCO2 and breath sounds checked- equal and bilateral Secured at: 23 cm Tube secured with: Tape Dental Injury: Teeth and Oropharynx as per pre-operative assessment

## 2016-11-17 NOTE — Anesthesia Postprocedure Evaluation (Signed)
Anesthesia Post Note  Patient: Aurther Loft  Procedure(s) Performed: Procedure(s) (LRB): ANTERIOR CERVICAL DECOMPRESSION FOR EPIDURAL ABSCESS CERVICAL SIX-SEVEN (N/A)  Patient location during evaluation: PACU Anesthesia Type: General Level of consciousness: awake and alert Pain management: pain level controlled Vital Signs Assessment: post-procedure vital signs reviewed and stable Respiratory status: spontaneous breathing, nonlabored ventilation, respiratory function stable and patient connected to nasal cannula oxygen Cardiovascular status: blood pressure returned to baseline and stable Postop Assessment: no signs of nausea or vomiting Anesthetic complications: no       Last Vitals:  Vitals:   11/17/16 1715 11/17/16 1723  BP:    Pulse: 92 92  Resp: 18 (!) 21  Temp:  37.3 C    Last Pain:  Vitals:   11/17/16 1723  TempSrc:   PainSc: 0-No pain                 Catalina Gravel

## 2016-11-17 NOTE — Anesthesia Preprocedure Evaluation (Signed)
Anesthesia Evaluation  Patient identified by MRN, date of birth, ID band Patient awake    Reviewed: Allergy & Precautions, NPO status , Patient's Chart, lab work & pertinent test results  History of Anesthesia Complications Negative for: history of anesthetic complications  Airway Mallampati: III  TM Distance: >3 FB Neck ROM: Full    Dental  (+) Teeth Intact, Dental Advisory Given, Missing   Pulmonary former smoker,    Pulmonary exam normal breath sounds clear to auscultation       Cardiovascular hypertension, Pt. 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  Reproductive/Obstetrics                             Anesthesia Physical Anesthesia Plan  ASA: III  Anesthesia Plan: General   Post-op Pain Management:    Induction: Intravenous  Airway Management Planned: Oral ETT  Additional Equipment:   Intra-op Plan:   Post-operative Plan: Extubation in OR  Informed Consent: I have reviewed the patients History and Physical, chart, labs and discussed the procedure including the risks, benefits and alternatives for the proposed anesthesia with the patient or authorized representative who has indicated his/her understanding and acceptance.   Dental advisory given  Plan Discussed with: CRNA  Anesthesia Plan Comments: (Risks/benefits of general anesthesia discussed with patient including risk of damage to teeth, lips,  gum, and tongue, nausea/vomiting, allergic reactions to medications, and the possibility of heart attack, stroke and death.  All patient questions answered.  Patient wishes to proceed.)        Anesthesia Quick Evaluation

## 2016-11-17 NOTE — ED Notes (Signed)
Transported to MRI

## 2016-11-17 NOTE — Progress Notes (Signed)
Pharmacy Antibiotic Note  Thomas Nielsen is a 61 y.o. male admitted on 11/17/2016 with osteomyelitis.  Pharmacy has been consulted for Vancomycin / Aztreonam dosing.  S/p discectomy with epidural abscess evacuation , arthrodesis, and plating 2/15 Last dose of vancomycin 14:30 pm  Plan: Aztreonam 2 grams iv Q 8 hours Vancomycin 750 mg iv Q 12 hours (next at 2 am)  Height: 5\' 11"  (180.3 cm) Weight: 172 lb (78 kg) IBW/kg (Calculated) : 75.3  Temp (24hrs), Avg:100.4 F (38 C), Min:98.7 F (37.1 C), Max:103.1 F (39.5 C)   Recent Labs Lab 11/17/16 0802 11/17/16 0814 11/17/16 0819  WBC 11.9*  --   --   CREATININE 1.81*  --  1.70*  LATICACIDVEN  --  2.80*  --     Estimated Creatinine Clearance: 48 mL/min (by C-G formula based on SCr of 1.7 mg/dL (H)).    Allergies  Allergen Reactions  . Lipitor [Atorvastatin] Other (See Comments)    Heart races  . Penicillins Rash and Other (See Comments)    "I break out in between my fingers, rash, hives or whatever"    Thank you Anette Guarneri, PharmD [phone]  11/17/2016 4:41 PM

## 2016-11-17 NOTE — Transfer of Care (Signed)
Immediate Anesthesia Transfer of Care Note  Patient: Thomas Nielsen  Procedure(s) Performed: Procedure(s): ANTERIOR CERVICAL DECOMPRESSION FOR EPIDURAL ABSCESS CERVICAL SIX-SEVEN (N/A)  Patient Location: PACU  Anesthesia Type:General  Level of Consciousness: awake, alert  and oriented  Airway & Oxygen Therapy: Patient Spontanous Breathing and Patient connected to nasal cannula oxygen  Post-op Assessment: Report given to RN, Post -op Vital signs reviewed and stable and Patient moving all extremities  Post vital signs: Reviewed and stable  Last Vitals:  Vitals:   11/17/16 1103 11/17/16 1310  BP:    Pulse:    Resp:    Temp: 37.4 C 37.1 C    Last Pain:  Vitals:   11/17/16 1310  TempSrc: Oral         Complications: No apparent anesthesia complications

## 2016-11-17 NOTE — ED Triage Notes (Signed)
Per EMS call was for fall. Pt fell backward, head injury against floor. Presents with fever 103.2. No obvious deformity. Alert and oriented x4. Pt reports body aches x 3 days.

## 2016-11-17 NOTE — Consult Note (Signed)
PULMONARY / CRITICAL CARE MEDICINE   Name: Thomas Nielsen MRN: [id number] DOB: 1956/06/28    ADMISSION DATE:  11/17/2016 CONSULTATION DATE:  11/17/2016  REFERRING MD:  Dr. Ronnald Ramp   CHIEF COMPLAINT:  Epidural Abscess   HISTORY OF PRESENT ILLNESS:   61 year old male with PMH of Anemia, CKD, HTN, Hyperlipidemia, ITP, and glomerulonephritis presents to the ED on 2/15 with 3 day history of fever, progressive weakness, and lower neck pain. Was planning on going to PCP when he became progressively weak and fell to the ground. MRI of cervical spine revealed 2cm ventral epidural abscess at C6-7 with severe spinal stenosis. Taken for decompressive anterior cervical discectomy C6-7 with evacuation of epidural abscess. PCCM asked to consult post-procedure.   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  PAST SURGICAL HISTORY: He  has a past surgical history that includes kidney biospy and Hernia repair.  Allergies  Allergen Reactions  . Penicillins     No current facility-administered medications on file prior to encounter.    Current Outpatient Prescriptions on File Prior to Encounter  Medication Sig  . atorvastatin (LIPITOR) 10 MG tablet Take 10 mg by mouth daily.  . calcium carbonate (OS-CAL) 600 MG TABS Take 600 mg by mouth daily.  . enalapril (VASOTEC) 10 MG tablet Take 10 mg by mouth daily.   . predniSONE (DELTASONE) 5 MG tablet Take 1 tablet (5 mg total) by mouth daily.    FAMILY HISTORY:  His indicated that the status of his father is unknown.    SOCIAL HISTORY: He  reports that he quit smoking about 6 years ago. He has a 30.00 pack-year smoking history. He has never used smokeless tobacco. He reports that he does not drink alcohol or use drugs.  REVIEW OF SYSTEMS:   All  negative; except for those that are bolded, which indicate positives.  Constitutional: weight loss, weight gain, night sweats, fevers, chills, fatigue, weakness.  HEENT: headaches, sore throat, sneezing, nasal congestion, post nasal drip, difficulty swallowing, tooth/dental problems, visual complaints, visual changes, ear aches. Neuro: difficulty with speech, weakness, numbness, ataxia. CV:  chest pain, orthopnea, PND, swelling in lower extremities, dizziness, palpitations, syncope.  Resp: cough, hemoptysis, dyspnea, wheezing. GI: heartburn, indigestion, abdominal pain, nausea, vomiting, diarrhea, constipation, change in bowel habits, loss of appetite, hematemesis, melena, hematochezia.  GU: dysuria, change in color of urine, urgency or frequency, flank pain, hematuria. MSK: joint pain or swelling, decreased range of motion. Psych: change in mood or affect, depression, anxiety, suicidal ideations, homicidal ideations. Skin: rash, itching, bruising.   SUBJECTIVE:  C-Collar in place, reports no pain.   VITAL SIGNS: BP 102/70   Pulse 100   Temp 99.3 F (37.4 C) (Oral)   Resp 22   Ht 5\' 11"  (1.803 m)   Wt 78 kg (172 lb)   SpO2 94%   BMI 23.99 kg/m   HEMODYNAMICS:    VENTILATOR SETTINGS:    INTAKE / OUTPUT: No intake/output data recorded.  PHYSICAL EXAMINATION: General: Adult male, no distress, lying in bed    Neuro: Alert, oriented, moves all extremities    HEENT: C-Collar in place  Cardiovascular: RRR, no MRG, NI S1/S2 Lungs: Clear breath sounds throughout, non-labored   Abdomen: obese, non-tender, active bowel sounds   Musculoskeletal: no acute   Skin: warm, dry, intact    LABS:  BMET  Recent Labs Lab 11/17/16 0802 11/17/16 0819  NA 131* 130*  K 3.7 3.8  CL 96* 95*  CO2 24  --   BUN 26* 26*  CREATININE 1.81* 1.70*  GLUCOSE 168* 170*    Electrolytes  Recent Labs Lab 11/17/16 0802  CALCIUM 8.4*    CBC  Recent Labs Lab 11/17/16 0802  11/17/16 0819  WBC 11.9*  --   HGB 12.2* 12.6*  HCT 34.3* 37.0*  PLT 126*  --     Coag's No results for input(s): APTT, INR in the last 168 hours.  Sepsis Markers  Recent Labs Lab 11/17/16 0814  LATICACIDVEN 2.80*    ABG No results for input(s): PHART, PCO2ART, PO2ART in the last 168 hours.  Liver Enzymes  Recent Labs Lab 11/17/16 0802  AST 18  ALT 13*  ALKPHOS 37*  BILITOT 1.3*  ALBUMIN 3.3*    Cardiac Enzymes No results for input(s): TROPONINI, PROBNP in the last 168 hours.  Glucose No results for input(s): GLUCAP in the last 168 hours.  Imaging Mr Cervical Spine W Or Wo Contrast  Result Date: 11/17/2016 CLINICAL DATA:  Neck pain, fever, and weakness.  Fall. EXAM: MRI CERVICAL SPINE WITHOUT AND WITH CONTRAST TECHNIQUE: Multiplanar and multiecho pulse sequences of the cervical spine, to include the craniocervical junction and cervicothoracic junction, were obtained without and with intravenous contrast. CONTRAST:  7 mL MultiHance COMPARISON:  None. FINDINGS: The study is moderately to severely motion degraded throughout. Alignment: Trace retrolisthesis of C6 on C7. Vertebrae: Minimal endplate edema and enhancement at C6-7 with moderate to severe disc space narrowing. Only minimal STIR hyperintensity in the disc. Cord: Limited assessment due to motion artifact. Questionable T2 hyperintensity in the cord at the C7-T1 level on the sagittal T2 sequence. Posterior Fossa, vertebral arteries, paraspinal tissues: Diffuse prevertebral edema and enhancement throughout the cervical and visualized upper thoracic spine. Small volume prevertebral fluid, greatest at C4 with AP thickness of 5 mm. Mild edema in the posterior cervical paraspinal soft tissues including in the interspinous soft tissues in the mid and lower cervical spine. Disc levels: There is extensive circumferential epidural enhancement throughout the cervical spine and extending into the visualized upper thoracic spine. A  nonenhancing T2 hyperintense ventral epidural collection at C6-7 measures approximately 2 cm in craniocaudal length and 6 mm in AP thickness and results in severe spinal stenosis with moderate to severe cord compression. IMPRESSION: 1. Extensive epidural enhancement throughout the cervical and visualized upper thoracic spine consistent with infectious phlegmon. 2. 2 cm ventral epidural abscess at C6-7 with severe spinal stenosis and cord compression. 3. Mild endplate edema and enhancement at C6-7 which may be degenerative or reflect osteomyelitis. 4. Diffuse prevertebral edema/phlegmon and small volume fluid. Critical Value/emergent results were called by telephone at the time of interpretation on 11/17/2016 at 10:23 am to Dr. Quintella Reichert , who verbally acknowledged these results. Electronically Signed   By: Logan Bores M.D.   On: 11/17/2016 10:27   Dg Chest Port 1 View  Result Date: 11/17/2016 CLINICAL DATA:  Increase weakness and fever over the past 3 days. The patient has sustained a fall today striking his head. History of ITP, chronic renal insufficiency, hypertension, former smoker. EXAM: PORTABLE CHEST 1 VIEW COMPARISON:  None in PACs FINDINGS: The lungs are well-expanded. The interstitial markings are coarse. There is no alveolar infiltrate. There is no pleural effusion. The heart and pulmonary vascularity are normal. There is calcification in the wall of the aortic arch and descending thoracic  aorta. There is degenerative joint space loss of the right shoulder. The observed bony thorax is unremarkable. IMPRESSION: Chronic bronchitic -smoking related interstitial changes. No pneumonia nor CHF. Thoracic aortic atherosclerosis. Electronically Signed   By: David  Martinique M.D.   On: 11/17/2016 08:38     STUDIES:  MR C Spine 2/15 > Extensive epidural enhancement throughout the cervical and visualized upper thoracic spine consistent with infectious phlegmon. 2cm ventral epidural abscess at C6-7 with  severe spinal stenosis and cord compression, mild endplate edema and enhancement at C6-7 which may be degenerative or reflect osteomyelitis   CULTURES: Blood 2/15 >   ANTIBIOTICS: Vancomycin 2/15 >> Azactam 2/15 >>  SIGNIFICANT EVENTS: 2/15 > Presents to ED with neck pain > Epidural abscess > OR for evacuation   LINES/TUBES:   DISCUSSION: 61 year old male with PMH of Anemia, CKD, HTN, Hyperlipidemia, ITP, and glomerulonephritis, presents to ED on 2/15 with neck pain found on MRI to have epidural abscess. Taken to OR for evacuation.   ASSESSMENT / PLAN:  PULMONARY A: No issues  P:   Maintain Saturation >92 Pulmonary Hygiene > IS, flutter valve   CARDIOVASCULAR A:  H/O HTN P:  Cardiac Monitoring  Continue home Vasotec   RENAL A:   Hyponatremia  Chronic Kidney Disease  Membranous Glomerulonephritis  P:   Trend BMP Replace electrolytes as needed Avoid Nephrotoxic Medications  Continue home prednisone   GASTROINTESTINAL A:   No issues  P:   Regular Diet   HEMATOLOGIC A:   ITP  P:  Trend CBC   INFECTIOUS A:   Epidural Abscess  P:   Trend Fever and WBC curve  Continue Vancomycin and Azactam  Continue Decadron per NeuroSurg  ENDOCRINE A:   Hyperglycemia  P:   SSI Glucose Checks Q4H  NEUROLOGIC A:   Epidural Stenosis with weakness  P:   Per Neurosurgery  RASS goal: 0  FAMILY  - Updates: patient updated on plan 2/15  - Inter-disciplinary family meet or Palliative Care meeting due by: 2/22  Hayden Pedro, AG-ACNP [facility]  Pgr: [phone]  PCCM Pgr: [phone]  Attending Note:  61 year old male with PMH above presenting after fall and neck pain.  Found to have an epidural abscess at C6-C7.  Was taken to the OR by neurosurgery (Dr. Ronnald Ramp) for decompression and diskectomy.  PCCM was consulted post op for medical management.  Patient came out of the OR extubated but I believe Dr. Ronnald Ramp would like to keep him  in the ICU for observation.  One exam, lungs are clear.  I reviewed CXR myself, no acute disease noted.  Discussed with Dr. Corrie Dandy and PCCM-NP.  Epidural abscess:  - Abx   - Steroids  - NS following  - C-collar  Sepsis:  - Pan culture  - Aztreonam  - Vancomycin  Hypoxemia:  - Titrate O2 for sat of 88-92%.  - Monitor sats.  CKD:  - IV hydration  - BMET in AM  - Replace electrolytes as indicated.  Patient seen and examined, agree with above note.  I dictated the care and orders written for this patient under my direction.  Rush Farmer, MD [phone]

## 2016-11-18 ENCOUNTER — Inpatient Hospital Stay (HOSPITAL_COMMUNITY): Payer: BLUE CROSS/BLUE SHIELD

## 2016-11-18 ENCOUNTER — Encounter (HOSPITAL_COMMUNITY): Payer: Self-pay | Admitting: Neurological Surgery

## 2016-11-18 DIAGNOSIS — Z9889 Other specified postprocedural states: Secondary | ICD-10-CM

## 2016-11-18 DIAGNOSIS — R7881 Bacteremia: Secondary | ICD-10-CM

## 2016-11-18 DIAGNOSIS — Z9689 Presence of other specified functional implants: Secondary | ICD-10-CM

## 2016-11-18 DIAGNOSIS — Z8042 Family history of malignant neoplasm of prostate: Secondary | ICD-10-CM

## 2016-11-18 DIAGNOSIS — N049 Nephrotic syndrome with unspecified morphologic changes: Secondary | ICD-10-CM

## 2016-11-18 DIAGNOSIS — I129 Hypertensive chronic kidney disease with stage 1 through stage 4 chronic kidney disease, or unspecified chronic kidney disease: Secondary | ICD-10-CM

## 2016-11-18 DIAGNOSIS — Z419 Encounter for procedure for purposes other than remedying health state, unspecified: Secondary | ICD-10-CM

## 2016-11-18 DIAGNOSIS — D6959 Other secondary thrombocytopenia: Secondary | ICD-10-CM

## 2016-11-18 DIAGNOSIS — Z87891 Personal history of nicotine dependence: Secondary | ICD-10-CM

## 2016-11-18 DIAGNOSIS — D693 Immune thrombocytopenic purpura: Secondary | ICD-10-CM

## 2016-11-18 DIAGNOSIS — Z9181 History of falling: Secondary | ICD-10-CM

## 2016-11-18 DIAGNOSIS — A4901 Methicillin susceptible Staphylococcus aureus infection, unspecified site: Secondary | ICD-10-CM

## 2016-11-18 DIAGNOSIS — G062 Extradural and subdural abscess, unspecified: Secondary | ICD-10-CM

## 2016-11-18 LAB — SEDIMENTATION RATE: SED RATE: 94 mm/h — AB (ref 0–16)

## 2016-11-18 LAB — BASIC METABOLIC PANEL
ANION GAP: 11 (ref 5–15)
BUN: 25 mg/dL — ABNORMAL HIGH (ref 6–20)
CHLORIDE: 100 mmol/L — AB (ref 101–111)
CO2: 21 mmol/L — AB (ref 22–32)
Calcium: 7.3 mg/dL — ABNORMAL LOW (ref 8.9–10.3)
Creatinine, Ser: 1.36 mg/dL — ABNORMAL HIGH (ref 0.61–1.24)
GFR calc non Af Amer: 54 mL/min — ABNORMAL LOW (ref >60)
GLUCOSE: 206 mg/dL — AB (ref 65–99)
POTASSIUM: 4.1 mmol/L (ref 3.5–5.1)
Sodium: 132 mmol/L — ABNORMAL LOW (ref 135–145)

## 2016-11-18 LAB — GLUCOSE, CAPILLARY
GLUCOSE-CAPILLARY: 193 mg/dL — AB (ref 65–99)
GLUCOSE-CAPILLARY: 217 mg/dL — AB (ref 65–99)
Glucose-Capillary: 152 mg/dL — ABNORMAL HIGH (ref 65–99)
Glucose-Capillary: 234 mg/dL — ABNORMAL HIGH (ref 65–99)
Glucose-Capillary: 236 mg/dL — ABNORMAL HIGH (ref 65–99)

## 2016-11-18 LAB — C-REACTIVE PROTEIN: CRP: 32.6 mg/dL — AB (ref <1.0)

## 2016-11-18 LAB — BLOOD CULTURE ID PANEL (REFLEXED)
ACINETOBACTER BAUMANNII: NOT DETECTED
CANDIDA GLABRATA: NOT DETECTED
Candida albicans: NOT DETECTED
Candida krusei: NOT DETECTED
Candida parapsilosis: NOT DETECTED
Candida tropicalis: NOT DETECTED
ENTEROBACTER CLOACAE COMPLEX: NOT DETECTED
ENTEROCOCCUS SPECIES: NOT DETECTED
Enterobacteriaceae species: NOT DETECTED
Escherichia coli: NOT DETECTED
Haemophilus influenzae: NOT DETECTED
Klebsiella oxytoca: NOT DETECTED
Klebsiella pneumoniae: NOT DETECTED
LISTERIA MONOCYTOGENES: NOT DETECTED
Methicillin resistance: NOT DETECTED
NEISSERIA MENINGITIDIS: NOT DETECTED
PROTEUS SPECIES: NOT DETECTED
PSEUDOMONAS AERUGINOSA: NOT DETECTED
STAPHYLOCOCCUS AUREUS BCID: DETECTED — AB
STAPHYLOCOCCUS SPECIES: DETECTED — AB
STREPTOCOCCUS AGALACTIAE: NOT DETECTED
STREPTOCOCCUS PNEUMONIAE: NOT DETECTED
STREPTOCOCCUS SPECIES: NOT DETECTED
Serratia marcescens: NOT DETECTED
Streptococcus pyogenes: NOT DETECTED

## 2016-11-18 LAB — CBC
HCT: 30.6 % — ABNORMAL LOW (ref 39.0–52.0)
HEMOGLOBIN: 10.4 g/dL — AB (ref 13.0–17.0)
MCH: 30 pg (ref 26.0–34.0)
MCHC: 34 g/dL (ref 30.0–36.0)
MCV: 88.2 fL (ref 78.0–100.0)
PLATELETS: 133 10*3/uL — AB (ref 150–400)
RBC: 3.47 MIL/uL — ABNORMAL LOW (ref 4.22–5.81)
RDW: 13.8 % (ref 11.5–15.5)
WBC: 10.1 10*3/uL (ref 4.0–10.5)

## 2016-11-18 LAB — TROPONIN I: TROPONIN I: 0.36 ng/mL — AB (ref <0.03)

## 2016-11-18 LAB — ECHOCARDIOGRAM COMPLETE
Height: 71 in
WEIGHTICAEL: 2752 [oz_av]

## 2016-11-18 LAB — PHOSPHORUS: PHOSPHORUS: 2 mg/dL — AB (ref 2.5–4.6)

## 2016-11-18 LAB — MAGNESIUM: Magnesium: 1.9 mg/dL (ref 1.7–2.4)

## 2016-11-18 LAB — HIV ANTIBODY (ROUTINE TESTING W REFLEX): HIV SCREEN 4TH GENERATION: NONREACTIVE

## 2016-11-18 MED ORDER — SODIUM PHOSPHATES 45 MMOLE/15ML IV SOLN
5.0000 mmol | Freq: Once | INTRAVENOUS | Status: AC
  Administered 2016-11-18: 5 mmol via INTRAVENOUS
  Filled 2016-11-18: qty 1.67

## 2016-11-18 MED ORDER — DIPHENHYDRAMINE HCL 50 MG/ML IJ SOLN: 25.0000 mg | Freq: Four times a day (QID) | INTRAMUSCULAR | Status: DC | PRN

## 2016-11-18 MED ORDER — DEXTROSE 5 % IV SOLN
10.0000 mmol | Freq: Once | INTRAVENOUS | Status: AC
  Administered 2016-11-18: 10 mmol via INTRAVENOUS
  Filled 2016-11-18: qty 3.33

## 2016-11-18 MED ORDER — INSULIN ASPART 100 UNIT/ML ~~LOC~~ SOLN
0.0000 [IU] | SUBCUTANEOUS | Status: DC
Start: 2016-11-18 — End: 2016-11-20
  Administered 2016-11-18: 3 [IU] via SUBCUTANEOUS
  Administered 2016-11-19: 2 [IU] via SUBCUTANEOUS
  Administered 2016-11-19: 1 [IU] via SUBCUTANEOUS
  Administered 2016-11-19: 2 [IU] via SUBCUTANEOUS
  Administered 2016-11-19 (×2): 1 [IU] via SUBCUTANEOUS
  Administered 2016-11-19: 2 [IU] via SUBCUTANEOUS

## 2016-11-18 MED ORDER — PREDNISONE 5 MG PO TABS
5.0000 mg | ORAL_TABLET | Freq: Every day | ORAL | Status: DC
  Administered 2016-11-19 – 2016-11-23 (×5): 5 mg via ORAL
  Filled 2016-11-18 (×5): qty 1

## 2016-11-18 MED ORDER — DEXTROSE 5 % IV SOLN
2.0000 g | INTRAVENOUS | Status: DC
  Administered 2016-11-18 – 2016-11-23 (×6): 2 g via INTRAVENOUS
  Filled 2016-11-18 (×6): qty 2

## 2016-11-18 MED ORDER — INSULIN ASPART 100 UNIT/ML ~~LOC~~ SOLN
2.0000 [IU] | SUBCUTANEOUS | Status: DC
  Administered 2016-11-18: 4 [IU] via SUBCUTANEOUS
  Administered 2016-11-18 (×2): 6 [IU] via SUBCUTANEOUS

## 2016-11-18 MED FILL — Thrombin For Soln 5000 Unit: CUTANEOUS | Qty: 2 | Status: AC

## 2016-11-18 NOTE — Progress Notes (Signed)
Inpatient Diabetes Program Recommendations  AACE/ADA: New Consensus Statement on Inpatient Glycemic Control (2015)  Target Ranges:  Prepandial:   less than 140 mg/dL      Peak postprandial:   less than 180 mg/dL (1-2 hours)      Critically ill patients:  140 - 180 mg/dL   Lab Results  Component Value Date   GLUCAP 193 (H) 11/18/2016   Results for RIOT, SIPP (MRN [id number]) as of 11/18/2016 09:24  Ref. Range 11/18/2016 03:20  Glucose Latest Ref Range: 65 - 99 mg/dL 206 (H)   Review of Glycemic Control  Diabetes history: CKD. Low dose steroids outpatient Outpatient Diabetes medications: none Current orders for Inpatient glycemic control: Novolog 2-6 units Q4H  Inpatient Diabetes Program Recommendations:   Per ADA recommendations "consider performing an A1C on all patients with diabetes or hyperglycemia admitted to the hospital if not performed in the prior 3 months".  Thank you,  Windy Carina, RN, MSN Diabetes Coordinator Inpatient Diabetes Program [phone] (Team Pager)

## 2016-11-18 NOTE — Evaluation (Signed)
Occupational Therapy Evaluation Patient Details Name: Thomas Nielsen MRN: [id number] DOB: July 07, 1956 Today's Date: 11/18/2016    History of Present Illness pt presents with C6-7 Epidural Abscess s/p ACDF and evacuation.  pt with hx of CKD, HTN, and Glomerulonephritis.     Clinical Impression   Pt admitted with above. He demonstrates the below listed deficits and will benefit from continued OT to maximize safety and independence with BADLs.  Pt presents to OT with generalized weakness, mild Rt UE weakness, decreased balance.  He requires min A for ADLs.  He should progress well and has necessary assist at discharge.  Will follow acutely.       Follow Up Recommendations  No OT follow up;Supervision/Assistance - 24 hour    Equipment Recommendations  None recommended by OT    Recommendations for Other Services       Precautions / Restrictions Precautions Precautions: Fall Precaution Comments: reviewed cervical precautions  Required Braces or Orthoses: Cervical Brace Cervical Brace: Hard collar;At all times Restrictions Weight Bearing Restrictions: No      Mobility Bed Mobility Overal bed mobility: Modified Independent             General bed mobility comments: pt needs increased time, but no A or cueing needed.    Transfers Overall transfer level: Needs assistance Equipment used: Rolling walker (2 wheeled) Transfers: Sit to/from Stand Sit to Stand: Min assist         General transfer comment: A for balance and steadying only.  cues for UE use.      Balance Overall balance assessment: Needs assistance Sitting-balance support: No upper extremity supported;Feet supported Sitting balance-Leahy Scale: Good     Standing balance support: Single extremity supported;Bilateral upper extremity supported;During functional activity Standing balance-Leahy Scale: Poor                              ADL Overall ADL's : Needs  assistance/impaired Eating/Feeding: Modified independent   Grooming: Wash/dry hands;Wash/dry face;Oral care;Min guard;Standing   Upper Body Bathing: Supervision/ safety;Sitting   Lower Body Bathing: Minimal assistance;Sit to/from stand   Upper Body Dressing : Minimal assistance;Sitting   Lower Body Dressing: Minimal assistance;Sit to/from stand Lower Body Dressing Details (indicate cue type and reason): able to don/doff siocks  Toilet Transfer: Minimal assistance;Stand-pivot;Comfort height toilet;Grab bars;RW;Ambulation;BSC   Toileting- Clothing Manipulation and Hygiene: Minimal assistance       Functional mobility during ADLs: Minimal assistance;Rolling walker General ADL Comments: Pt requires assist for balance      Vision     Perception     Praxis      Pertinent Vitals/Pain Pain Assessment: No/denies pain     Hand Dominance Right   Extremity/Trunk Assessment Upper Extremity Assessment Upper Extremity Assessment: RUE deficits/detail RUE Deficits / Details: grossly 4/5    Lower Extremity Assessment Lower Extremity Assessment: Defer to PT evaluation LLE Deficits / Details: Sensation intact, Strength grossly 4-/5.   LLE Coordination: decreased fine motor   Cervical / Trunk Assessment Cervical / Trunk Assessment: Normal   Communication Communication Communication: No difficulties   Cognition Arousal/Alertness: Awake/alert Behavior During Therapy: WFL for tasks assessed/performed Overall Cognitive Status: Within Functional Limits for tasks assessed                     General Comments       Exercises       Shoulder Instructions      Home Living  Family/patient expects to be discharged to:: Private residence Living Arrangements: Parent Available Help at Discharge: Family;Available 24 hours/day (Mom for S only, sister and niece can A.) Type of Home: House Home Access: Ramped entrance     Home Layout: One level     Bathroom Shower/Tub:  Teacher, early years/pre: Handicapped height Bathroom Accessibility: Yes   Home Equipment: Environmental consultant - 2 wheels;Tub bench          Prior Functioning/Environment Level of Independence: Independent        Comments: Pt works for a Museum/gallery exhibitions officer doing scheduling         OT Problem List: Decreased strength;Decreased activity tolerance;Impaired balance (sitting and/or standing);Decreased safety awareness;Decreased knowledge of use of DME or AE;Decreased knowledge of precautions   OT Treatment/Interventions: Self-care/ADL training;Therapeutic exercise;DME and/or AE instruction;Therapeutic activities;Patient/family education;Balance training    OT Goals(Current goals can be found in the care plan section) Acute Rehab OT Goals Patient Stated Goal: Back to work OT Goal Formulation: With patient Time For Goal Achievement: 11/25/16 Potential to Achieve Goals: Good ADL Goals Pt Will Perform Grooming: with modified independence;standing Pt Will Perform Upper Body Bathing: with modified independence;sitting Pt Will Perform Lower Body Bathing: with modified independence;sit to/from stand Pt Will Perform Upper Body Dressing: with modified independence;sitting Pt Will Perform Lower Body Dressing: with modified independence;sit to/from stand Pt Will Transfer to Toilet: with modified independence;ambulating;regular height toilet;bedside commode;grab bars Pt Will Perform Toileting - Clothing Manipulation and hygiene: with modified independence;sit to/from stand Pt Will Perform Tub/Shower Transfer: Tub transfer;with modified independence;ambulating;tub bench;rolling walker  OT Frequency: Min 2X/week   Barriers to D/C:            Co-evaluation              End of Session Equipment Utilized During Treatment: Rolling walker Nurse Communication: Mobility status  Activity Tolerance: Patient tolerated treatment well Patient left: in bed;with call bell/phone within reach    Time: [id number] OT Time Calculation (min): 17 min Charges:  OT General Charges $OT Visit: 1 Procedure OT Evaluation $OT Eval Low Complexity: 1 Procedure G-Codes:    Jacquiline Zurcher M 12-17-2016, 4:00 PM

## 2016-11-18 NOTE — Progress Notes (Signed)
Elink MD notified per neurosurgery regarding RN finding of potential BBB on bedside cardiac monitor. Patient asymptomatic. Pre-op EKG sinus rhythm. Orders received. Will continue to monitor.

## 2016-11-18 NOTE — Progress Notes (Signed)
PHARMACY - PHYSICIAN COMMUNICATION CRITICAL VALUE ALERT - BLOOD CULTURE IDENTIFICATION (BCID)  Results for orders placed or performed during the hospital encounter of 11/17/16  Blood Culture ID Panel (Reflexed) (Collected: 11/17/2016  8:10 AM)  Result Value Ref Range   Enterococcus species NOT DETECTED NOT DETECTED   Listeria monocytogenes NOT DETECTED NOT DETECTED   Staphylococcus species DETECTED (A) NOT DETECTED   Staphylococcus aureus DETECTED (A) NOT DETECTED   Methicillin resistance NOT DETECTED NOT DETECTED   Streptococcus species NOT DETECTED NOT DETECTED   Streptococcus agalactiae NOT DETECTED NOT DETECTED   Streptococcus pneumoniae NOT DETECTED NOT DETECTED   Streptococcus pyogenes NOT DETECTED NOT DETECTED   Acinetobacter baumannii NOT DETECTED NOT DETECTED   Enterobacteriaceae species NOT DETECTED NOT DETECTED   Enterobacter cloacae complex NOT DETECTED NOT DETECTED   Escherichia coli NOT DETECTED NOT DETECTED   Klebsiella oxytoca NOT DETECTED NOT DETECTED   Klebsiella pneumoniae NOT DETECTED NOT DETECTED   Proteus species NOT DETECTED NOT DETECTED   Serratia marcescens NOT DETECTED NOT DETECTED   Haemophilus influenzae NOT DETECTED NOT DETECTED   Neisseria meningitidis NOT DETECTED NOT DETECTED   Pseudomonas aeruginosa NOT DETECTED NOT DETECTED   Candida albicans NOT DETECTED NOT DETECTED   Candida glabrata NOT DETECTED NOT DETECTED   Candida krusei NOT DETECTED NOT DETECTED   Candida parapsilosis NOT DETECTED NOT DETECTED   Candida tropicalis NOT DETECTED NOT DETECTED    Name of physician (or Provider) Contacted:   N/A  Changes to prescribed antibiotics required:   MSSA in 4/4 blood cultures.  Currently receiving Vancomycin and Aztreonam for epidural abcess.  Consider d/c Aztreonam and f/u ID recommendations for MSSA coverage.      Caryl Pina 11/18/2016  5:53 AM

## 2016-11-18 NOTE — Progress Notes (Signed)
       Date: 11/18/2016  Patient name: [place] record number: [id number]  Date of birth: 1955-12-13    FULL NOTE TO FOLLOW WHEN I SEE THE PATIENT LATER TODAY        [facility] Staphylococcus aureus bacteremia   Staphylococcus aureus bacteremia (SAB) is associated with a high rate of complications and mortality.  Specific aspects of clinical management are critical to optimizing the outcome of patients with SAB.  Therefore, the [facility] Antimicrobial Management Team [facility]) has initiated an intervention aimed at improving the management of SAB at [facility].  To do so, Infectious Diseases physicians are providing an evidence-based consult for the management of all patients with SAB.     Yes No Comments  Perform follow-up blood cultures (even if the patient is afebrile) to ensure clearance of bacteremia [x]  []  NOW and after all lines are out  Remove vascular catheter and obtain follow-up blood cultures after the removal of the catheter [x]  []  If pt has central line it will HAVE TO BE REMOVED WHEN POSSIBLE FOR CATHETER HOLIDAYY  Perform echocardiography to evaluate for endocarditis (transthoracic ECHO is 40-50% sensitive, TEE is > 90% sensitive) [x]  []  Please keep in mind, that neither test can definitively EXCLUDE endocarditis, and that should clinical suspicion remain high for endocarditis the patient should then still be treated with an "endocarditis" duration of therapy = 6 weeks  Consult electrophysiologist to evaluate implanted cardiac device (pacemaker, ICD) []  []    Ensure source control [x]  []  Have all abscesses been drained effectively? Have deep seeded infections (septic joints or osteomyelitis) had appropriate surgical debridement?  Epidural abscess I and D   Investigate for "metastatic" sites of infection [x]  []  Does the patient have ANY symptom or physical exam finding that would suggest a deeper infection (back or neck pain that  may be suggestive of vertebral osteomyelitis or epidural abscess, muscle pain that could be a symptom of pyomyositis)?  Keep in mind that for deep seeded infections MRI imaging with contrast is preferred rather than other often insensitive tests such as plain x-rays, especially early in a patient's presentation.    Change antibiotic therapy to vancomycin [x]  []  Beta-lactam antibiotics are preferred for MSSA due to higher cure rates.   If on Vancomycin, goal trough should be 15 - 20 mcg/mL  Estimated duration of IV antibiotic therapy:  6-8 weeks [x]  []  Consult case management for probably prolonged outpatient IV antibiotic therapy   NOTE A BETA LACTAM SUCH AS CEFTRIAXONE would be PREFERABLE BUT I DO NOT KNOW IF PT HAS TOLERATED CEPHALOSPORINS BEFORE WOUULD BE HELPFUL TO KNOW AND SWITCH TO CTX IF POSSIBLE  Rhina Brackett Dam 11/18/2016, 8:16 AM

## 2016-11-18 NOTE — Progress Notes (Signed)
Patient ID: Thomas Nielsen, male   DOB: June 20, 1956, 61 y.o.   MRN: [id number] Subjective: Patient reports no pain, hands better, moving LLe better  Objective: Vital signs in last 24 hours: Temp:  [98.1 F (36.7 C)-99.6 F (37.6 C)] 98.9 F (37.2 C) (02/16 1200) Pulse Rate:  [79-99] 87 (02/16 1000) Resp:  [11-31] 16 (02/16 1000) BP: (95-133)/(55-86) 105/84 (02/16 1000) SpO2:  [93 %-99 %] 94 % (02/16 1000)  Intake/Output from previous day: 02/15 0701 - 02/16 0700 In: 2318 [P.O.:270; I.V.:2048] Out: 2625 [Urine:2600; Blood:25] Intake/Output this shift: No intake/output data recorded.  Neurologic: Grossly normal  Lab Results: Lab Results  Component Value Date   WBC 10.1 11/18/2016   HGB 10.4 (L) 11/18/2016   HCT 30.6 (L) 11/18/2016   MCV 88.2 11/18/2016   PLT 133 (L) 11/18/2016   No results found for: INR, PROTIME BMET Lab Results  Component Value Date   NA 132 (L) 11/18/2016   K 4.1 11/18/2016   CL 100 (L) 11/18/2016   CO2 21 (L) 11/18/2016   GLUCOSE 206 (H) 11/18/2016   BUN 25 (H) 11/18/2016   CREATININE 1.36 (H) 11/18/2016   CALCIUM 7.3 (L) 11/18/2016    Studies/Results: Dg Cervical Spine 2-3 Views  Result Date: 11/17/2016 CLINICAL DATA:  Intraoperative imaging for C6-7 ACDF. EXAM: CERVICAL SPINE - 2-3 VIEW; DG C-ARM 61-120 MIN COMPARISON:  MRI cervical spine earlier today. FINDINGS: Two fluoroscopic intraoperative spot views of the cervical spine in the lateral projection demonstrate anterior plate and screws interbody spacer in place at C6-7. No acute finding. IMPRESSION: C6-7 ACDF. Electronically Signed   By: Inge Rise M.D.   On: 11/17/2016 16:32   Mr Cervical Spine W Or Wo Contrast  Result Date: 11/17/2016 CLINICAL DATA:  Neck pain, fever, and weakness.  Fall. EXAM: MRI CERVICAL SPINE WITHOUT AND WITH CONTRAST TECHNIQUE: Multiplanar and multiecho pulse sequences of the cervical spine, to include the craniocervical junction and cervicothoracic junction,  were obtained without and with intravenous contrast. CONTRAST:  7 mL MultiHance COMPARISON:  None. FINDINGS: The study is moderately to severely motion degraded throughout. Alignment: Trace retrolisthesis of C6 on C7. Vertebrae: Minimal endplate edema and enhancement at C6-7 with moderate to severe disc space narrowing. Only minimal STIR hyperintensity in the disc. Cord: Limited assessment due to motion artifact. Questionable T2 hyperintensity in the cord at the C7-T1 level on the sagittal T2 sequence. Posterior Fossa, vertebral arteries, paraspinal tissues: Diffuse prevertebral edema and enhancement throughout the cervical and visualized upper thoracic spine. Small volume prevertebral fluid, greatest at C4 with AP thickness of 5 mm. Mild edema in the posterior cervical paraspinal soft tissues including in the interspinous soft tissues in the mid and lower cervical spine. Disc levels: There is extensive circumferential epidural enhancement throughout the cervical spine and extending into the visualized upper thoracic spine. A nonenhancing T2 hyperintense ventral epidural collection at C6-7 measures approximately 2 cm in craniocaudal length and 6 mm in AP thickness and results in severe spinal stenosis with moderate to severe cord compression. IMPRESSION: 1. Extensive epidural enhancement throughout the cervical and visualized upper thoracic spine consistent with infectious phlegmon. 2. 2 cm ventral epidural abscess at C6-7 with severe spinal stenosis and cord compression. 3. Mild endplate edema and enhancement at C6-7 which may be degenerative or reflect osteomyelitis. 4. Diffuse prevertebral edema/phlegmon and small volume fluid. Critical Value/emergent results were called by telephone at the time of interpretation on 11/17/2016 at 10:23 am to Dr. Quintella Reichert , who verbally  acknowledged these results. Electronically Signed   By: Logan Bores M.D.   On: 11/17/2016 10:27   Dg Chest Port 1 View  Result Date:  11/17/2016 CLINICAL DATA:  Increase weakness and fever over the past 3 days. The patient has sustained a fall today striking his head. History of ITP, chronic renal insufficiency, hypertension, former smoker. EXAM: PORTABLE CHEST 1 VIEW COMPARISON:  None in PACs FINDINGS: The lungs are well-expanded. The interstitial markings are coarse. There is no alveolar infiltrate. There is no pleural effusion. The heart and pulmonary vascularity are normal. There is calcification in the wall of the aortic arch and descending thoracic aorta. There is degenerative joint space loss of the right shoulder. The observed bony thorax is unremarkable. IMPRESSION: Chronic bronchitic -smoking related interstitial changes. No pneumonia nor CHF. Thoracic aortic atherosclerosis. Electronically Signed   By: Roselind Klus  [place] M.D.   On: 11/17/2016 08:38   Dg C-arm 1-60 Min  Result Date: 11/17/2016 CLINICAL DATA:  Intraoperative imaging for C6-7 ACDF. EXAM: CERVICAL SPINE - 2-3 VIEW; DG C-ARM 61-120 MIN COMPARISON:  MRI cervical spine earlier today. FINDINGS: Two fluoroscopic intraoperative spot views of the cervical spine in the lateral projection demonstrate anterior plate and screws interbody spacer in place at C6-7. No acute finding. IMPRESSION: C6-7 ACDF. Electronically Signed   By: Inge Rise M.D.   On: 11/17/2016 16:32    Assessment/Plan: Doing great, to the floor, abx   LOS: 1 day    Bricia Taher S 11/18/2016, 1:02 PM

## 2016-11-18 NOTE — Progress Notes (Signed)
PULMONARY / CRITICAL CARE MEDICINE   Name: Thomas Nielsen MRN: [id number] DOB: [birth year]    ADMISSION DATE:  11/17/2016 CONSULTATION DATE:  11/17/2016  REFERRING MD: Dr. Ronnald Ramp  CHIEF COMPLAINT:  Epidural Abscess   Brief: 61 year old male with PMH of Anemia, CKD, HTN, Hyperlipidemia, ITP, and glomerulonephritis presents to the ED on 2/15 with 3 day history of fever, progressive weakness, and lower neck pain. Was planning on going to PCP when he became progressively weak and fell to the ground. MRI of cervical spine revealed 2cm ventral epidural abscess at C6-7 with severe spinal stenosis. Taken for decompressive anterior cervical discectomy C6-7 with evacuation of epidural abscess  SUBJECTIVE:  C-Collar in place, post-OR abscess evacuation yesterday. Complains of no pain.   VITAL SIGNS: BP 119/83   Pulse 79   Temp 98.1 F (36.7 C) (Oral)   Resp 14   Ht 5\' 11"  (1.803 m)   Wt 78 kg (172 lb)   SpO2 96%   BMI 23.99 kg/m   HEMODYNAMICS:    VENTILATOR SETTINGS:    INTAKE / OUTPUT: I/O last 3 completed shifts: In: 2318 [P.O.:270; I.V.:2048] Out: 2625 [Urine:2600; Blood:25]  PHYSICAL EXAMINATION: General:  Adult male, no distress, lying in bed Neuro:  Alert, oriented, follows commands, moves all extremities  HEENT:  C-Collar in place  Cardiovascular:  RRR, no MRG, NI S1/S2 Lungs:  Clear breath sounds, non-labored  Abdomen:  Non-tender, non-distended, active bowel sounds  Musculoskeletal:  No acute  Skin:  Warm, dry, intact   LABS:  BMET  Recent Labs Lab 11/17/16 0802 11/17/16 0819 11/18/16 0320  NA 131* 130* 132*  K 3.7 3.8 4.1  CL 96* 95* 100*  CO2 24  --  21*  BUN 26* 26* 25*  CREATININE 1.81* 1.70* 1.36*  GLUCOSE 168* 170* 206*    Electrolytes  Recent Labs Lab 11/17/16 0802 11/18/16 0320  CALCIUM 8.4* 7.3*  MG  --  1.9  PHOS  --  2.0*    CBC  Recent Labs Lab 11/17/16 0802 11/17/16 0819 11/18/16 0320  WBC 11.9*  --  10.1  HGB 12.2* 12.6*  10.4*  HCT 34.3* 37.0* 30.6*  PLT 126*  --  133*    Coag's No results for input(s): APTT, INR in the last 168 hours.  Sepsis Markers  Recent Labs Lab 11/17/16 0814  LATICACIDVEN 2.80*    ABG No results for input(s): PHART, PCO2ART, PO2ART in the last 168 hours.  Liver Enzymes  Recent Labs Lab 11/17/16 0802  AST 18  ALT 13*  ALKPHOS 37*  BILITOT 1.3*  ALBUMIN 3.3*    Cardiac Enzymes No results for input(s): TROPONINI, PROBNP in the last 168 hours.  Glucose No results for input(s): GLUCAP in the last 168 hours.  Imaging Dg Cervical Spine 2-3 Views  Result Date: 11/17/2016 CLINICAL DATA:  Intraoperative imaging for C6-7 ACDF. EXAM: CERVICAL SPINE - 2-3 VIEW; DG C-ARM 61-120 MIN COMPARISON:  MRI cervical spine earlier today. FINDINGS: Two fluoroscopic intraoperative spot views of the cervical spine in the lateral projection demonstrate anterior plate and screws interbody spacer in place at C6-7. No acute finding. IMPRESSION: C6-7 ACDF. Electronically Signed   By: Inge Rise M.D.   On: 11/17/2016 16:32   Mr Cervical Spine W Or Wo Contrast  Result Date: 11/17/2016 CLINICAL DATA:  Neck pain, fever, and weakness.  Fall. EXAM: MRI CERVICAL SPINE WITHOUT AND WITH CONTRAST TECHNIQUE: Multiplanar and multiecho pulse sequences of the cervical spine, to include the  craniocervical junction and cervicothoracic junction, were obtained without and with intravenous contrast. CONTRAST:  7 mL MultiHance COMPARISON:  None. FINDINGS: The study is moderately to severely motion degraded throughout. Alignment: Trace retrolisthesis of C6 on C7. Vertebrae: Minimal endplate edema and enhancement at C6-7 with moderate to severe disc space narrowing. Only minimal STIR hyperintensity in the disc. Cord: Limited assessment due to motion artifact. Questionable T2 hyperintensity in the cord at the C7-T1 level on the sagittal T2 sequence. Posterior Fossa, vertebral arteries, paraspinal tissues: Diffuse  prevertebral edema and enhancement throughout the cervical and visualized upper thoracic spine. Small volume prevertebral fluid, greatest at C4 with AP thickness of 5 mm. Mild edema in the posterior cervical paraspinal soft tissues including in the interspinous soft tissues in the mid and lower cervical spine. Disc levels: There is extensive circumferential epidural enhancement throughout the cervical spine and extending into the visualized upper thoracic spine. A nonenhancing T2 hyperintense ventral epidural collection at C6-7 measures approximately 2 cm in craniocaudal length and 6 mm in AP thickness and results in severe spinal stenosis with moderate to severe cord compression. IMPRESSION: 1. Extensive epidural enhancement throughout the cervical and visualized upper thoracic spine consistent with infectious phlegmon. 2. 2 cm ventral epidural abscess at C6-7 with severe spinal stenosis and cord compression. 3. Mild endplate edema and enhancement at C6-7 which may be degenerative or reflect osteomyelitis. 4. Diffuse prevertebral edema/phlegmon and small volume fluid. Critical Value/emergent results were called by telephone at the time of interpretation on 11/17/2016 at 10:23 am to Dr. Quintella Reichert , who verbally acknowledged these results. Electronically Signed   By: Logan Bores M.D.   On: 11/17/2016 10:27   Dg Chest Port 1 View  Result Date: 11/17/2016 CLINICAL DATA:  Increase weakness and fever over the past 3 days. The patient has sustained a fall today striking his head. History of ITP, chronic renal insufficiency, hypertension, former smoker. EXAM: PORTABLE CHEST 1 VIEW COMPARISON:  None in PACs FINDINGS: The lungs are well-expanded. The interstitial markings are coarse. There is no alveolar infiltrate. There is no pleural effusion. The heart and pulmonary vascularity are normal. There is calcification in the wall of the aortic arch and descending thoracic aorta. There is degenerative joint space loss  of the right shoulder. The observed bony thorax is unremarkable. IMPRESSION: Chronic bronchitic -smoking related interstitial changes. No pneumonia nor CHF. Thoracic aortic atherosclerosis. Electronically Signed   By: David  Martinique M.D.   On: 11/17/2016 08:38   Dg C-arm 1-60 Min  Result Date: 11/17/2016 CLINICAL DATA:  Intraoperative imaging for C6-7 ACDF. EXAM: CERVICAL SPINE - 2-3 VIEW; DG C-ARM 61-120 MIN COMPARISON:  MRI cervical spine earlier today. FINDINGS: Two fluoroscopic intraoperative spot views of the cervical spine in the lateral projection demonstrate anterior plate and screws interbody spacer in place at C6-7. No acute finding. IMPRESSION: C6-7 ACDF. Electronically Signed   By: Inge Rise M.D.   On: 11/17/2016 16:32     STUDIES:  MR C Spine 2/15 > Extensive epidural enhancement throughout the cervical and visualized upper thoracic spine consistent with infectious phlegmon. 2cm ventral epidural abscess at C6-7 with severe spinal stenosis and cord compression, mild endplate edema and enhancement at C6-7 which may be degenerative or reflect osteomyelitis   CULTURES: Blood 2/15 >  MSSA >>   ANTIBIOTICS: Vancomycin 2/15 >> Azactam 2/15 >>  SIGNIFICANT EVENTS: 2/15 > Presents to ED with neck pain > Epidural abscess > OR for evacuation   LINES/TUBES:   DISCUSSION: 60  year old male with PMH of Anemia, CKD, HTN, Hyperlipidemia, ITP, and glomerulonephritis, presents to ED on 2/15 with neck pain found on MRI to have epidural abscess. Taken to OR for evacuation.   ASSESSMENT / PLAN:  PULMONARY A: No issues  P:   Maintain Saturation >92 Pulmonary Hygiene > IS, Flutter valve   CARDIOVASCULAR A:  H/O HTN P:  Cardiac Monitoring  Continue home Vasotec   RENAL A:   Hyponatremia  Chronic Kidney Disease  Membranous Glomerulonephritis  P:   Trend BMP Replace electrolytes as needed Avoid Nephrotoxic Medications  Continue home prednisone   GASTROINTESTINAL A:    No issues  P:   Regular Diet   HEMATOLOGIC A:   ITP P:  Trend CBC  INFECTIOUS A:   Epidural Abscess  P:   Trend Fever and WBC curve  Continue Vancomycin and Azactam  Continue Decadron per NeuroSurg   ENDOCRINE A:   Hyperglycemia  P:   SSI Glucose Checks Q4H  NEUROLOGIC A:   Epidural Stenosis with weakness  P:   Per NeuroSurgery  PT/OT Decadron Taper  RASS goal:0    FAMILY  - Updates: Patient updated at bedside   - Inter-disciplinary family meet or Palliative Care meeting due by: 2/22     Hayden Pedro, AG-ACNP [facility]  Pgr: [phone]  PCCM Pgr: [phone]   ATTENDING NOTE / ATTESTATION NOTE :   I have discussed the case with the resident/APP  Hayden Pedro NP.   I agree with the resident/APP's  history, physical examination, assessment, and plans.    I have edited the above note and modified it according to our agreed history, physical examination, assessment and plan.   Briefly, 61 year old male with PMH of Anemia, CKD, HTN, Hyperlipidemia, ITP, and glomerulonephritis presents to the ED on 2/15 with 3 day history of fever, progressive weakness, and lower neck pain. Was planning on going to PCP when he became progressively weak and fell to the ground. MRI of cervical spine revealed 2cm ventral epidural abscess at C6-7 with severe spinal stenosis. Taken for decompressive anterior cervical discectomy C6-7 with evacuation of epidural abscess.  Pt has done well post operatively and overnight. No issues.   Patient seen, comfortable, eating, watching TV.   Vitals:  Vitals:   11/18/16 0800 11/18/16 0830 11/18/16 0900 11/18/16 1000  BP: (!) 95/55  118/79 105/84  Pulse: 93  89 87  Resp: 16  (!) 23 16  Temp: 99 F (37.2 C)     TempSrc: Oral     SpO2: 95% 97% 94% 94%  Weight:      Height:        Constitutional/General: well-nourished, well-developed, not in any distress  Body mass index is 23.99 kg/m. Wt  Readings from Last 3 Encounters:  11/17/16 78 kg (172 lb)  03/07/14 78.2 kg (172 lb 6.4 oz)  09/06/13 79.2 kg (174 lb 8 oz)    HEENT: PERLA, anicteric sclerae. (-) Oral thrush.   Neck: No masses. Midline trachea. No JVD, (-) LAD. (-) bruits appreciated. (+) cervical neck collar  Respiratory/Chest: Grossly normal chest. (-) deformity. (-) Accessory muscle use.  Symmetric expansion. Diminished BS on both lower lung zones. (-) wheezing, crackles, rhonchi (-) egophony  Cardiovascular: Regular rate and  rhythm, heart sounds normal, no murmur or gallops,  Trace peripheral edema  Gastrointestinal:  Normal bowel sounds. Soft, non-tender. No hepatosplenomegaly.  (-) masses.   Musculoskeletal:  Normal muscle tone.   Extremities: Grossly  normal. (-) clubbing, cyanosis.  (-) edema  Skin: (-) rash,lesions seen.   Neurological/Psychiatric : sedated, intubated. CN grossly intact. (-) lateralizing signs.     CBC Recent Labs     11/17/16  0802  11/17/16  0819  11/18/16  0320  WBC  11.9*   --   10.1  HGB  12.2*  12.6*  10.4*  HCT  34.3*  37.0*  30.6*  PLT  126*   --   133*    Coag's No results for input(s): APTT, INR in the last 72 hours.  BMET Recent Labs     11/17/16  0802  11/17/16  0819  11/18/16  0320  NA  131*  130*  132*  K  3.7  3.8  4.1  CL  96*  95*  100*  CO2  24   --   21*  BUN  26*  26*  25*  CREATININE  1.81*  1.70*  1.36*  GLUCOSE  168*  170*  206*    Electrolytes Recent Labs     11/17/16  0802  11/18/16  0320  CALCIUM  8.4*  7.3*  MG   --   1.9  PHOS   --   2.0*    Sepsis Markers No results for input(s): PROCALCITON, O2SATVEN in the last 72 hours.  Invalid input(s): LACTICACIDVEN  ABG No results for input(s): PHART, PCO2ART, PO2ART in the last 72 hours.  Liver Enzymes Recent Labs     11/17/16  0802  AST  18  ALT  13*  ALKPHOS  37*  BILITOT  1.3*  ALBUMIN  3.3*    Cardiac Enzymes No results for input(s): TROPONINI, PROBNP in the  last 72 hours.  Glucose Recent Labs     11/18/16  0845  GLUCAP  193*    Imaging Dg Cervical Spine 2-3 Views  Result Date: 11/17/2016 CLINICAL DATA:  Intraoperative imaging for C6-7 ACDF. EXAM: CERVICAL SPINE - 2-3 VIEW; DG C-ARM 61-120 MIN COMPARISON:  MRI cervical spine earlier today. FINDINGS: Two fluoroscopic intraoperative spot views of the cervical spine in the lateral projection demonstrate anterior plate and screws interbody spacer in place at C6-7. No acute finding. IMPRESSION: C6-7 ACDF. Electronically Signed   By: Inge Rise M.D.   On: 11/17/2016 16:32   Mr Cervical Spine W Or Wo Contrast  Result Date: 11/17/2016 CLINICAL DATA:  Neck pain, fever, and weakness.  Fall. EXAM: MRI CERVICAL SPINE WITHOUT AND WITH CONTRAST TECHNIQUE: Multiplanar and multiecho pulse sequences of the cervical spine, to include the craniocervical junction and cervicothoracic junction, were obtained without and with intravenous contrast. CONTRAST:  7 mL MultiHance COMPARISON:  None. FINDINGS: The study is moderately to severely motion degraded throughout. Alignment: Trace retrolisthesis of C6 on C7. Vertebrae: Minimal endplate edema and enhancement at C6-7 with moderate to severe disc space narrowing. Only minimal STIR hyperintensity in the disc. Cord: Limited assessment due to motion artifact. Questionable T2 hyperintensity in the cord at the C7-T1 level on the sagittal T2 sequence. Posterior Fossa, vertebral arteries, paraspinal tissues: Diffuse prevertebral edema and enhancement throughout the cervical and visualized upper thoracic spine. Small volume prevertebral fluid, greatest at C4 with AP thickness of 5 mm. Mild edema in the posterior cervical paraspinal soft tissues including in the interspinous soft tissues in the mid and lower cervical spine. Disc levels: There is extensive circumferential epidural enhancement throughout the cervical spine and extending into the visualized upper thoracic spine. A  nonenhancing T2 hyperintense ventral epidural  collection at C6-7 measures approximately 2 cm in craniocaudal length and 6 mm in AP thickness and results in severe spinal stenosis with moderate to severe cord compression. IMPRESSION: 1. Extensive epidural enhancement throughout the cervical and visualized upper thoracic spine consistent with infectious phlegmon. 2. 2 cm ventral epidural abscess at C6-7 with severe spinal stenosis and cord compression. 3. Mild endplate edema and enhancement at C6-7 which may be degenerative or reflect osteomyelitis. 4. Diffuse prevertebral edema/phlegmon and small volume fluid. Critical Value/emergent results were called by telephone at the time of interpretation on 11/17/2016 at 10:23 am to Dr. Quintella Reichert , who verbally acknowledged these results. Electronically Signed   By: Logan Bores M.D.   On: 11/17/2016 10:27   Dg Chest Port 1 View  Result Date: 11/17/2016 CLINICAL DATA:  Increase weakness and fever over the past 3 days. The patient has sustained a fall today striking his head. History of ITP, chronic renal insufficiency, hypertension, former smoker. EXAM: PORTABLE CHEST 1 VIEW COMPARISON:  None in PACs FINDINGS: The lungs are well-expanded. The interstitial markings are coarse. There is no alveolar infiltrate. There is no pleural effusion. The heart and pulmonary vascularity are normal. There is calcification in the wall of the aortic arch and descending thoracic aorta. There is degenerative joint space loss of the right shoulder. The observed bony thorax is unremarkable. IMPRESSION: Chronic bronchitic -smoking related interstitial changes. No pneumonia nor CHF. Thoracic aortic atherosclerosis. Electronically Signed   By: David  Martinique M.D.   On: 11/17/2016 08:38   Dg C-arm 1-60 Min  Result Date: 11/17/2016 CLINICAL DATA:  Intraoperative imaging for C6-7 ACDF. EXAM: CERVICAL SPINE - 2-3 VIEW; DG C-ARM 61-120 MIN COMPARISON:  MRI cervical spine earlier today.  FINDINGS: Two fluoroscopic intraoperative spot views of the cervical spine in the lateral projection demonstrate anterior plate and screws interbody spacer in place at C6-7. No acute finding. IMPRESSION: C6-7 ACDF. Electronically Signed   By: Inge Rise M.D.   On: 11/17/2016 16:32   Assessment/Plan : S/P Decompressive anterior cervical discectomy C6-7 with evacuation of epidural abscess, Anterior cervical arthrodesis C6-7 utilizing a cortical cancellus allograft, Anterior cervical plating C6-7 utilizing a Nuvasive plate  - doing well post surgery. Eating. No significant complaints.  - cont IV abx per ID recommendation. Currently on Rocephin. Has MSSA bacteremia  CKD. Pt with chronic GN - cont IVF - cont prednisone   PCCM will sign off for now.   Please call back if with questions or issues.     Monica Becton, MD 11/18/2016, 10:39 AM [facility] Pager [phone] After 3 pm or if no answer, call [phone]

## 2016-11-18 NOTE — Consult Note (Addendum)
S: Called by E-link to evaluate patient as bedside RN noted rhythm change (?RBBB on tele?) as well as slightly elevated troponin.  O: EKG obtained, is normal sinus rhythm. Troponin initial value of 0.36. Patient asymptomatic, normal exam.   A/P: No urgent cardiac needs, no ICU needs. Unlikely to represent ACS given lack of symptoms and EKG changes. Given renal insuffiencey, troponin levels will be amplified and will be slow to clear. May represent some peri-operative demand.  OK to send to floor, may consult cardiology tomorrow AM if further concerned.  CRITICAL CARE Performed by: Luz Brazen  Total critical care time: 30 minutes  Critical care time was exclusive of separately billable procedures and treating other patients.  Critical care was necessary to treat or prevent imminent or life-threatening deterioration.  Critical care was time spent personally by me on the following activities: development of treatment plan with patient and/or surrogate as well as nursing, discussions with consultants, evaluation of patient's response to treatment, examination of patient, obtaining history from patient or surrogate, ordering and performing treatments and interventions, ordering and review of laboratory studies, ordering and review of radiographic studies, pulse oximetry and re-evaluation of patient's condition.   Luz Brazen, MD Pulmonary & Critical Care Medicine November 18, 2016, 9:37 PM

## 2016-11-18 NOTE — Progress Notes (Signed)
[facility] Progress Note Patient Name: Thomas Nielsen DOB: 05-14-1956 MRN: [id number]   Date of Service  11/18/2016  HPI/Events of Note  Multiple issues on a patient that PCCM signed off of: 1. Needs "floor" Novolog SSI, 2. Nurse concerned for nw BBB from bedside rhythm strip.   eICU Interventions  Will order: 1. 12 Lead EKG now.  2. Cycle Troponin. 3. Will need telemetry bed when he goes to floor.  4. Change to Q 4 hour sensitive Novolog SSI.      Intervention Category Major Interventions: Hyperglycemia - active titration of insulin therapy  Lysle Dingwall 11/18/2016, 5:49 PM

## 2016-11-18 NOTE — Consult Note (Signed)
Date of Admission:  11/17/2016  Date of Consult:  11/18/2016  Reason for Consult: Epidural abscess and bacteremia due to methicillin sensitive Staphylococcus aureus Referring Physician: Dr. Ronnald Ramp   HPI: Thomas Nielsen is an 61 y.o. 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 Distally had complaints of neck pain and loss of strength in his arms. His left leg then became weak along with his hands. He suffered a fall he had severe pain fever and generalized malaise. He underwent an MRI of the cervical spine which shows a suspected epidural abscess at C6-C7 with severe stenosis extending above C6 and below C7. He was taken to the operating room yesterday by Dr. Ronnald Ramp who performed a decompressive anterior cervical discectomy C6-C7 with evacuation of the epidural abscess anterior cervical arthrodesis from C6-C7 using allograft and anterior cervical plating using a new invasive plate. Patient's blood cultures were taken on admission as well and have suddenly turned positive for methicillin sensitive Staphylococcus aureus. He was initially on vancomycin and aztreonam postoperatively prior to identification of this organism and the blood. I talked him today in the neuro ICU and found that his allergy to penicillin constitute a rash between his fingers. He did not recall being on cephalosporins but his penicillin allergy did not seem severe and we therefore decided to go forward with ceftriaxone 2 g daily and to discontinue his vancomycin.   Past Medical History:  Diagnosis Date  . Anemia, unspecified 09/06/2013  . CKD (chronic kidney disease)    on chronic low dose Prednisone  . HTN (hypertension)   . Hyperlipidemia   . ITP (idiopathic thrombocytopenic purpura) 2011   on chronic low dose Prednisone  . 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  ANTERIOR CERVICAL DECOMPRESSION FOR EPIDURAL ABSCESS N/A 11/17/2016   Procedure: ANTERIOR CERVICAL DECOMPRESSION FOR EPIDURAL ABSCESS CERVICAL SIX-SEVEN;  Surgeon: Eustace Moore, MD;  Location: [facility];  Service: Neurosurgery;  Laterality: N/A;  . HERNIA REPAIR    . kidney biospy      Social History:  reports that he quit smoking about 6 years ago. He has a 30.00 pack-year smoking history. He has never used smokeless tobacco. He reports that he does not drink alcohol or use drugs.   Family History  Problem Relation Age of Onset  . Cancer Father     prostate ca    Allergies  Allergen Reactions  . Lipitor [Atorvastatin] Other (See Comments)    Heart races  . Penicillins Rash and Other (See Comments)    "I break out in between my fingers, rash, hives or whatever"     Medications: I have reviewed patients current medications as documented in Epic Anti-infectives    Start     Dose/Rate Route Frequency Ordered Stop   11/18/16 1100  cefTRIAXone (ROCEPHIN) 2 g in dextrose 5 % 50 mL IVPB     2 g 100 mL/hr over 30 Minutes Intravenous Every 24 hours 11/18/16 1018     11/18/16 0200  vancomycin (VANCOCIN) IVPB 750 mg/150 ml premix  Status:  Discontinued     750 mg 150 mL/hr over 60 Minutes Intravenous Every 12 hours 11/17/16 1736 11/18/16 1018   11/17/16 1830  aztreonam (AZACTAM) 2 g in dextrose 5 % 50 mL IVPB  Status:  Discontinued     2 g 100 mL/hr over 30  Minutes Intravenous Every 8 hours 11/17/16 1736 11/18/16 0816   11/17/16 1607  bacitracin 50,000 Units in sodium chloride irrigation 0.9 % 500 mL irrigation  Status:  Discontinued       As needed 11/17/16 1608 11/17/16 1629   11/17/16 1427  vancomycin (VANCOCIN) 1-5 GM/200ML-% IVPB    Comments:  Flowers, [place]   : cabinet override      11/17/16 1427 11/18/16 0229         ROS:as in HPI otherwise remainder of 12 point Review of Systems is negative   Blood pressure 113/79, pulse 90, temperature 99.9 F (37.7 C), temperature  source Oral, resp. rate 19, height _0  (1.803 m), weight 172 lb (78 kg), SpO2 96 %. General: Alert and awake, oriented x3, not in any acute distress. HEENT: anicteric sclera,  EOMI, oropharynx clear and without exudate Cardiovascular: regular rate, normal r,  no murmur rubs or gallops Pulmonary: clear to auscultation bilaterally, no wheezing, rales or rhonchi Gastrointestinal: soft nontender, nondistended, normal bowel sounds, Musculoskeletal: no  clubbing or edema noted bilaterally Skin,  Wearing C collar over surgical site Neuro: No focal deficits   Results for orders placed or performed during the hospital encounter of 11/17/16 (from the past 48 hour(s))  Comprehensive metabolic panel     Status: Abnormal   Collection Time: 11/17/16  8:02 AM  Result Value Ref Range   Sodium 131 (L) 135 - 145 mmol/L   Potassium 3.7 3.5 - 5.1 mmol/L   Chloride 96 (L) 101 - 111 mmol/L   CO2 24 22 - 32 mmol/L   Glucose, Bld 168 (H) 65 - 99 mg/dL   BUN 26 (H) 6 - 20 mg/dL   Creatinine, Ser 1.81 (H) 0.61 - 1.24 mg/dL   Calcium 8.4 (L) 8.9 - 10.3 mg/dL   Total Protein 6.9 6.5 - 8.1 g/dL   Albumin 3.3 (L) 3.5 - 5.0 g/dL   AST 18 15 - 41 U/L   ALT 13 (L) 17 - 63 U/L   Alkaline Phosphatase 37 (L) 38 - 126 U/L   Total Bilirubin 1.3 (H) 0.3 - 1.2 mg/dL   GFR calc non Af Amer 38 (L) >60 mL/min   GFR calc Af Amer 45 (L) >60 mL/min    Comment: (NOTE) The eGFR has been calculated using the CKD EPI equation. This calculation has not been validated in all clinical situations. eGFR's persistently <60 mL/min signify possible Chronic Kidney Disease.    Anion gap 11 5 - 15  CBC with Differential     Status: Abnormal   Collection Time: 11/17/16  8:02 AM  Result Value Ref Range   WBC 11.9 (H) 4.0 - 10.5 K/uL   RBC 3.97 (L) 4.22 - 5.81 MIL/uL   Hemoglobin 12.2 (L) 13.0 - 17.0 g/dL   HCT 34.3 (L) 39.0 - 52.0 %   MCV 86.4 78.0 - 100.0 fL   MCH 30.7 26.0 - 34.0 pg   MCHC 35.6 30.0 - 36.0 g/dL   RDW 13.1  11.5 - 15.5 %   Platelets 126 (L) 150 - 400 K/uL   Neutrophils Relative % 91 %   Neutro Abs 10.9 (H) 1.7 - 7.7 K/uL   Lymphocytes Relative 4 %   Lymphs Abs 0.5 (L) 0.7 - 4.0 K/uL   Monocytes Relative 5 %   Monocytes Absolute 0.6 0.1 - 1.0 K/uL   Eosinophils Relative 0 %   Eosinophils Absolute 0.0 0.0 - 0.7 K/uL   Basophils Relative 0 %  Basophils Absolute 0.0 0.0 - 0.1 K/uL  Blood Culture (routine x 2)     Status: None (Preliminary result)   Collection Time: 11/17/16  8:10 AM  Result Value Ref Range   Specimen Description BLOOD LEFT FOREARM    Special Requests BOTTLES DRAWN AEROBIC AND ANAEROBIC 5CC    Culture  Setup Time      GRAM POSITIVE COCCI IN CLUSTERS IN BOTH AEROBIC AND ANAEROBIC BOTTLES Organism ID to follow CRITICAL RESULT CALLED TO, READ BACK BY AND VERIFIED WITH: GREG ABBOTT, PHARMD _0  11/18/16 MKELLY,MLT    Culture      TOO YOUNG TO READ Performed at [facility], [place] [address] [place], [place] [address]    Report Status PENDING   Blood Culture ID Panel (Reflexed)     Status: Abnormal   Collection Time: 11/17/16  8:10 AM  Result Value Ref Range   Enterococcus species NOT DETECTED NOT DETECTED   Listeria monocytogenes NOT DETECTED NOT DETECTED   Staphylococcus species DETECTED (A) NOT DETECTED    Comment: CRITICAL RESULT CALLED TO, READ BACK BY AND VERIFIED WITH: GREG ABBOTT, PHARMD _1  11/18/16 MKELLY,MLT    Staphylococcus aureus DETECTED (A) NOT DETECTED    Comment: Methicillin (oxacillin) susceptible Staphylococcus aureus (MSSA). Preferred therapy is anti staphylococcal beta lactam antibiotic (Cefazolin or Nafcillin), unless clinically contraindicated. CRITICAL RESULT CALLED TO, READ BACK BY AND VERIFIED WITH: GREG ABBOTT, PHARMD _2  02/16/1/8 MKELLY,MLT    Methicillin resistance NOT DETECTED NOT DETECTED   Streptococcus species NOT DETECTED NOT DETECTED   Streptococcus agalactiae NOT DETECTED NOT DETECTED   Streptococcus pneumoniae NOT  DETECTED NOT DETECTED   Streptococcus pyogenes NOT DETECTED NOT DETECTED   Acinetobacter baumannii NOT DETECTED NOT DETECTED   Enterobacteriaceae species NOT DETECTED NOT DETECTED   Enterobacter cloacae complex NOT DETECTED NOT DETECTED   Escherichia coli NOT DETECTED NOT DETECTED   Klebsiella oxytoca NOT DETECTED NOT DETECTED   Klebsiella pneumoniae NOT DETECTED NOT DETECTED   Proteus species NOT DETECTED NOT DETECTED   Serratia marcescens NOT DETECTED NOT DETECTED   Haemophilus influenzae NOT DETECTED NOT DETECTED   Neisseria meningitidis NOT DETECTED NOT DETECTED   Pseudomonas aeruginosa NOT DETECTED NOT DETECTED   Candida albicans NOT DETECTED NOT DETECTED   Candida glabrata NOT DETECTED NOT DETECTED   Candida krusei NOT DETECTED NOT DETECTED   Candida parapsilosis NOT DETECTED NOT DETECTED   Candida tropicalis NOT DETECTED NOT DETECTED    Comment: Performed at [facility] [address], [place],  [address]  I-Stat CG4 Lactic Acid, ED     Status: Abnormal   Collection Time: 11/17/16  8:14 AM  Result Value Ref Range   Lactic Acid, Venous 2.80 (HH) 0.5 - 1.9 mmol/L   Comment NOTIFIED PHYSICIAN   I-stat Chem 8, ED     Status: Abnormal   Collection Time: 11/17/16  8:19 AM  Result Value Ref Range   Sodium 130 (L) 135 - 145 mmol/L   Potassium 3.8 3.5 - 5.1 mmol/L   Chloride 95 (L) 101 - 111 mmol/L   BUN 26 (H) 6 - 20 mg/dL   Creatinine, Ser 1.70 (H) 0.61 - 1.24 mg/dL   Glucose, Bld 170 (H) 65 - 99 mg/dL   Calcium, Ion 1.00 (L) 1.15 - 1.40 mmol/L   TCO2 24 0 - 100 mmol/L   Hemoglobin 12.6 (L) 13.0 - 17.0 g/dL   HCT 37.0 (L) 39.0 - 52.0 %  Blood Culture (routine x 2)  Status: None (Preliminary result)   Collection Time: 11/17/16  8:45 AM  Result Value Ref Range   Specimen Description BLOOD RIGHT FOREARM    Special Requests BOTTLES DRAWN AEROBIC AND ANAEROBIC 5CC    Culture  Setup Time      GRAM POSITIVE COCCI IN CLUSTERS IN BOTH AEROBIC AND ANAEROBIC  BOTTLES CRITICAL VALUE NOTED.  VALUE IS CONSISTENT WITH PREVIOUSLY REPORTED AND CALLED VALUE.    Culture      TOO YOUNG TO READ Performed at [facility], [place] [address] [place] [address], [place],  [address]    Report Status PENDING   Surgical pcr screen     Status: Abnormal   Collection Time: 11/17/16  1:14 PM  Result Value Ref Range   MRSA, PCR NEGATIVE NEGATIVE   Staphylococcus aureus POSITIVE (A) NEGATIVE    Comment:        The Xpert SA Assay (FDA approved for NASAL specimens in patients over 3 years of age), is one component of a comprehensive surveillance program.  Test performance has been validated by [facility] for patients greater than or equal to 5 year old. It is not intended to diagnose infection nor to guide or monitor treatment.   Type and screen [place]     Status: None   Collection Time: 11/17/16  1:17 PM  Result Value Ref Range   ABO/RH(D) A POS    Antibody Screen NEG    Sample Expiration 11/20/2016   ABO/Rh     Status: None   Collection Time: 11/17/16  1:17 PM  Result Value Ref Range   ABO/RH(D) A POS   Aerobic/Anaerobic Culture (surgical/deep wound)     Status: None (Preliminary result)   Collection Time: 11/17/16  3:16 PM  Result Value Ref Range   Specimen Description ABSCESS CERVICAL EPIDURAL    Special Requests NONE    Gram Stain      FEW WBC PRESENT, PREDOMINANTLY PMN ABUNDANT GRAM POSITIVE COCCI IN CLUSTERS    Culture PENDING    Report Status PENDING   Aerobic/Anaerobic Culture (surgical/deep wound)     Status: None (Preliminary result)   Collection Time: 11/17/16  3:23 PM  Result Value Ref Range   Specimen Description ABSCESS SOFT TISSUE    Special Requests CERVICAL EPIDURAL ABSCESS    Gram Stain      FEW WBC PRESENT, PREDOMINANTLY PMN ABUNDANT GRAM POSITIVE COCCI IN PAIRS MODERATE GRAM POSITIVE COCCI IN CLUSTERS    Culture PENDING    Report Status PENDING   HIV antibody     Status: None   Collection Time:  11/18/16  3:20 AM  Result Value Ref Range   HIV Screen 4th Generation wRfx Non Reactive Non Reactive    Comment: (NOTE) Performed At: [facility] [place], [place] [id number] Lindon Romp MD AO:[id number]   Sedimentation rate     Status: Abnormal   Collection Time: 11/18/16  3:20 AM  Result Value Ref Range   Sed Rate 94 (H) 0 - 16 mm/hr  C-reactive protein     Status: Abnormal   Collection Time: 11/18/16  3:20 AM  Result Value Ref Range   CRP 32.6 (H) <1.0 mg/dL  CBC     Status: Abnormal   Collection Time: 11/18/16  3:20 AM  Result Value Ref Range   WBC 10.1 4.0 - 10.5 K/uL   RBC 3.47 (L) 4.22 - 5.81 MIL/uL   Hemoglobin 10.4 (L) 13.0 - 17.0 g/dL   HCT  30.6 (L) 39.0 - 52.0 %   MCV 88.2 78.0 - 100.0 fL   MCH 30.0 26.0 - 34.0 pg   MCHC 34.0 30.0 - 36.0 g/dL   RDW 13.8 11.5 - 15.5 %   Platelets 133 (L) 150 - 400 K/uL  Basic metabolic panel     Status: Abnormal   Collection Time: 11/18/16  3:20 AM  Result Value Ref Range   Sodium 132 (L) 135 - 145 mmol/L   Potassium 4.1 3.5 - 5.1 mmol/L   Chloride 100 (L) 101 - 111 mmol/L   CO2 21 (L) 22 - 32 mmol/L   Glucose, Bld 206 (H) 65 - 99 mg/dL   BUN 25 (H) 6 - 20 mg/dL   Creatinine, Ser 1.36 (H) 0.61 - 1.24 mg/dL   Calcium 7.3 (L) 8.9 - 10.3 mg/dL   GFR calc non Af Amer 54 (L) >60 mL/min   GFR calc Af Amer >60 >60 mL/min    Comment: (NOTE) The eGFR has been calculated using the CKD EPI equation. This calculation has not been validated in all clinical situations. eGFR's persistently <60 mL/min signify possible Chronic Kidney Disease.    Anion gap 11 5 - 15  Magnesium     Status: None   Collection Time: 11/18/16  3:20 AM  Result Value Ref Range   Magnesium 1.9 1.7 - 2.4 mg/dL  Phosphorus     Status: Abnormal   Collection Time: 11/18/16  3:20 AM  Result Value Ref Range   Phosphorus 2.0 (L) 2.5 - 4.6 mg/dL  Glucose, capillary     Status: Abnormal   Collection Time: 11/18/16  8:45 AM  Result Value  Ref Range   Glucose-Capillary 193 (H) 65 - 99 mg/dL  Culture, blood (Routine X 2) w Reflex to ID Panel     Status: None (Preliminary result)   Collection Time: 11/18/16 11:19 AM  Result Value Ref Range   Specimen Description BLOOD LEFT ANTECUBITAL    Special Requests BOTTLES DRAWN AEROBIC AND ANAEROBIC 10CC EA    Culture PENDING    Report Status PENDING   Glucose, capillary     Status: Abnormal   Collection Time: 11/18/16 11:19 AM  Result Value Ref Range   Glucose-Capillary 217 (H) 65 - 99 mg/dL  Glucose, capillary     Status: Abnormal   Collection Time: 11/18/16  3:31 PM  Result Value Ref Range   Glucose-Capillary 234 (H) 65 - 99 mg/dL   _0 (sdes,specrequest,cult,reptstatus)   ) Recent Results (from the past 720 hour(s))  Blood Culture (routine x 2)     Status: None (Preliminary result)   Collection Time: 11/17/16  8:10 AM  Result Value Ref Range Status   Specimen Description BLOOD LEFT FOREARM  Final   Special Requests BOTTLES DRAWN AEROBIC AND ANAEROBIC 5CC  Final   Culture  Setup Time   Final    GRAM POSITIVE COCCI IN CLUSTERS IN BOTH AEROBIC AND ANAEROBIC BOTTLES Organism ID to follow CRITICAL RESULT CALLED TO, READ BACK BY AND VERIFIED WITH: GREG ABBOTT, PHARMD _1  11/18/16 MKELLY,MLT    Culture   Final    TOO YOUNG TO READ Performed at [facility], [address] [place], [place] [address]    Report Status PENDING  Incomplete  Blood Culture ID Panel (Reflexed)     Status: Abnormal   Collection Time: 11/17/16  8:10 AM  Result Value Ref Range Status   Enterococcus species NOT DETECTED NOT DETECTED Final   Listeria monocytogenes NOT  DETECTED NOT DETECTED Final   Staphylococcus species DETECTED (A) NOT DETECTED Final    Comment: CRITICAL RESULT CALLED TO, READ BACK BY AND VERIFIED WITH: GREG ABBOTT, PHARMD _0  11/18/16 MKELLY,MLT    Staphylococcus aureus DETECTED (A) NOT DETECTED Final    Comment: Methicillin (oxacillin) susceptible  Staphylococcus aureus (MSSA). Preferred therapy is anti staphylococcal beta lactam antibiotic (Cefazolin or Nafcillin), unless clinically contraindicated. CRITICAL RESULT CALLED TO, READ BACK BY AND VERIFIED WITH: GREG ABBOTT, PHARMD _1  02/16/1/8 MKELLY,MLT    Methicillin resistance NOT DETECTED NOT DETECTED Final   Streptococcus species NOT DETECTED NOT DETECTED Final   Streptococcus agalactiae NOT DETECTED NOT DETECTED Final   Streptococcus pneumoniae NOT DETECTED NOT DETECTED Final   Streptococcus pyogenes NOT DETECTED NOT DETECTED Final   Acinetobacter baumannii NOT DETECTED NOT DETECTED Final   Enterobacteriaceae species NOT DETECTED NOT DETECTED Final   Enterobacter cloacae complex NOT DETECTED NOT DETECTED Final   Escherichia coli NOT DETECTED NOT DETECTED Final   Klebsiella oxytoca NOT DETECTED NOT DETECTED Final   Klebsiella pneumoniae NOT DETECTED NOT DETECTED Final   Proteus species NOT DETECTED NOT DETECTED Final   Serratia marcescens NOT DETECTED NOT DETECTED Final   Haemophilus influenzae NOT DETECTED NOT DETECTED Final   Neisseria meningitidis NOT DETECTED NOT DETECTED Final   Pseudomonas aeruginosa NOT DETECTED NOT DETECTED Final   Candida albicans NOT DETECTED NOT DETECTED Final   Candida glabrata NOT DETECTED NOT DETECTED Final   Candida krusei NOT DETECTED NOT DETECTED Final   Candida parapsilosis NOT DETECTED NOT DETECTED Final   Candida tropicalis NOT DETECTED NOT DETECTED Final    Comment: Performed at [facility], [place] [address], [place], [place] [address]  Blood Culture (routine x 2)     Status: None (Preliminary result)   Collection Time: 11/17/16  8:45 AM  Result Value Ref Range Status   Specimen Description BLOOD RIGHT FOREARM  Final   Special Requests BOTTLES DRAWN AEROBIC AND ANAEROBIC 5CC  Final   Culture  Setup Time   Final    GRAM POSITIVE COCCI IN CLUSTERS IN BOTH AEROBIC AND ANAEROBIC BOTTLES CRITICAL VALUE NOTED.  VALUE IS CONSISTENT WITH  PREVIOUSLY REPORTED AND CALLED VALUE.    Culture   Final    TOO YOUNG TO READ Performed at [facility], [place] [address], [place], [place] [address]    Report Status PENDING  Incomplete  Surgical pcr screen     Status: Abnormal   Collection Time: 11/17/16  1:14 PM  Result Value Ref Range Status   MRSA, PCR NEGATIVE NEGATIVE Final   Staphylococcus aureus POSITIVE (A) NEGATIVE Final    Comment:        The Xpert SA Assay (FDA approved for NASAL specimens in patients over 72 years of age), is one component of a comprehensive surveillance program.  Test performance has been validated by [facility] for patients greater than or equal to 44 year old. It is not intended to diagnose infection nor to guide or monitor treatment.   Aerobic/Anaerobic Culture (surgical/deep wound)     Status: None (Preliminary result)   Collection Time: 11/17/16  3:16 PM  Result Value Ref Range Status   Specimen Description ABSCESS CERVICAL EPIDURAL  Final   Special Requests NONE  Final   Gram Stain   Final    FEW WBC PRESENT, PREDOMINANTLY PMN ABUNDANT GRAM POSITIVE COCCI IN CLUSTERS    Culture PENDING  Incomplete   Report Status PENDING  Incomplete  Aerobic/Anaerobic  Culture (surgical/deep wound)     Status: None (Preliminary result)   Collection Time: 11/17/16  3:23 PM  Result Value Ref Range Status   Specimen Description ABSCESS SOFT TISSUE  Final   Special Requests CERVICAL EPIDURAL ABSCESS  Final   Gram Stain   Final    FEW WBC PRESENT, PREDOMINANTLY PMN ABUNDANT GRAM POSITIVE COCCI IN PAIRS MODERATE GRAM POSITIVE COCCI IN CLUSTERS    Culture PENDING  Incomplete   Report Status PENDING  Incomplete  Culture, blood (Routine X 2) w Reflex to ID Panel     Status: None (Preliminary result)   Collection Time: 11/18/16 11:19 AM  Result Value Ref Range Status   Specimen Description BLOOD LEFT ANTECUBITAL  Final   Special Requests BOTTLES DRAWN AEROBIC AND ANAEROBIC 10CC EA  Final   Culture  PENDING  Incomplete   Report Status PENDING  Incomplete     Impression/Recommendation  Active Problems:   Epidural abscess   Thomas Nielsen is a 61 y.o. male with  Nephrotic syndrome ITP chronic kidney disease on chronic prednisone therapy who is been admitted with methicillin sensitive staph coccus aureus bacteremia with sepsis and an epidural abscess status post neurosurgery  #1 Methicillin sensitive  of staph aureus bacteremia and epidural abscess status post surgery with placement of plate:       [place] [facility] [facility]icrobial Management Team Staphylococcus aureus bacteremia   Staphylococcus aureus bacteremia (SAB) is associated with a high rate of complications and mortality.  Specific aspects of clinical management are critical to optimizing the outcome of patients with SAB.  Therefore, the [facility] Antimicrobial Management Team [facility]) has initiated an intervention aimed at improving the management of SAB at [facility].  To do so, Infectious Diseases physicians are providing an evidence-based consult for the management of all patients with SAB.     Yes No Comments  Perform follow-up blood cultures (even if the patient is afebrile) to ensure clearance of bacteremia _0  _1  Repeat blood cultures taken today   Remove vascular catheter and obtain follow-up blood cultures after the removal of the catheter _2  _3  DO NOT PLACE PICC OR CENTRAL LINE UNTIL WE HAVE PROVEN CLEARANCE OF BACTEREMIA WHICH MAY TAKE ANOTHER 5-6 days  Perform echocardiography to evaluate for endocarditis (transthoracic ECHO is 40-50% sensitive, TEE is > 90% sensitive) _4  _5  Please keep in mind, that neither test can definitively EXCLUDE endocarditis, and that should clinical suspicion remain high for endocarditis the patient should then still be treated with an "endocarditis" duration of therapy = 6 weeks  2-D echocardiogram has been performed results are pending transesophageal echocardiogram would normally be more ideal  for defining potential endocarditis but I think that is going to be probably not problematic given his recent cervical spine surgery.   Consult electrophysiologist to evaluate implanted cardiac device (pacemaker, ICD) _6  _7    Ensure source control _8  _9  Have all abscesses been drained effectively? Have deep seeded infections (septic joints or osteomyelitis) had appropriate surgical debridement?  He has had neurosurgery   Investigate for "metastatic" sites of infection _10  _11  Does the patient have ANY symptom or physical exam finding that would suggest a deeper infection (back or neck pain that may be suggestive of vertebral osteomyelitis or epidural abscess, muscle pain that could be a symptom of pyomyositis)?  Keep in mind that for deep seeded infections MRI imaging with contrast is preferred rather than other often insensitive tests such as plain x-rays, especially early in a patient's presentation.  There  are no other clear-cut "metastatic" sites based on his symptoms   Change antibiotic therapy to Ceftriaxone 2 g daily   Plus likely oral rifampin once his bloodstream cultures have cleared  _0  _1  Beta-lactam antibiotics are preferred for MSSA due to higher cure rates.   If on Vancomycin, goal trough should be 15 - 20 mcg/mL  Estimated duration of IV antibiotic therapy:  6-8 weeks  _2  _3  Consult case management for probably prolonged outpatient IV antibiotic therapy    #2 Screening: check for HIV, HCV and HBV  11/18/2016, 4:42 PM   Thank you so much for this interesting consult  [place] [facility] [place] [phone] (pager) [phone] (office) 11/18/2016, 4:42 PM  [place] 11/18/2016, 4:42 PM

## 2016-11-18 NOTE — Progress Notes (Signed)
  Echocardiogram 2D Echocardiogram has been performed.  Bobbye Charleston 11/18/2016, 3:38 PM

## 2016-11-18 NOTE — Evaluation (Signed)
Physical Therapy Evaluation Patient Details Name: Thomas Nielsen MRN: [id number] DOB: Oct 26, 1955 Today's Date: 11/18/2016   History of Present Illness  pt presents with C6-7 Epidural Abscess s/p ACDF and evacuation.  pt with hx of CKD, HTN, and Glomerulonephritis.    Clinical Impression  Pt generally moving well and needs encouragement to increase ambulation distance.  Pt indicates he will stay at his mother's home and his sister and niece will be able to A him and his mother.  Feel pt will be able to progress to return to home.  Will continue to follow.      Follow Up Recommendations Home health PT;Supervision - Intermittent    Equipment Recommendations  None recommended by PT    Recommendations for Other Services       Precautions / Restrictions Precautions Precautions: Fall Required Braces or Orthoses: Cervical Brace Cervical Brace: Hard collar;At all times Restrictions Weight Bearing Restrictions: No      Mobility  Bed Mobility Overal bed mobility: Modified Independent             General bed mobility comments: pt needs increased time, but no A or cueing needed.    Transfers Overall transfer level: Needs assistance Equipment used: Rolling walker (2 wheeled) Transfers: Sit to/from Stand Sit to Stand: Min assist         General transfer comment: A for balance and steadying only.  cues for UE use.    Ambulation/Gait Ambulation/Gait assistance: Min assist Ambulation Distance (Feet): 35 Feet Assistive device: Rolling walker (2 wheeled) Gait Pattern/deviations: Step-through pattern;Decreased stride length     General Gait Details: pt self-limits ambulation distance and needs MinA occasionally for balance.    Stairs            Wheelchair Mobility    Modified Rankin (Stroke Patients Only)       Balance Overall balance assessment: Needs assistance Sitting-balance support: No upper extremity supported;Feet supported Sitting balance-Leahy Scale:  Good     Standing balance support: Single extremity supported;Bilateral upper extremity supported;During functional activity Standing balance-Leahy Scale: Poor                               Pertinent Vitals/Pain Pain Assessment: No/denies pain    Home Living Family/patient expects to be discharged to:: Private residence Living Arrangements: Parent Available Help at Discharge: Family;Available 24 hours/day (Mom for S only, sister and niece can A.) Type of Home: House Home Access: Ramped entrance     Home Layout: One level Home Equipment: Walker - 2 wheels;Tub bench      Prior Function Level of Independence: Independent               Hand Dominance        Extremity/Trunk Assessment   Upper Extremity Assessment Upper Extremity Assessment: Defer to OT evaluation    Lower Extremity Assessment Lower Extremity Assessment: LLE deficits/detail LLE Deficits / Details: Sensation intact, Strength grossly 4-/5.   LLE Coordination: decreased fine motor    Cervical / Trunk Assessment Cervical / Trunk Assessment: Normal  Communication   Communication: No difficulties  Cognition Arousal/Alertness: Awake/alert Behavior During Therapy: WFL for tasks assessed/performed Overall Cognitive Status: Within Functional Limits for tasks assessed                      General Comments      Exercises     Assessment/Plan    PT Assessment Patient  needs continued PT services  PT Problem List Decreased strength;Decreased activity tolerance;Decreased balance;Decreased mobility;Decreased coordination;Decreased knowledge of use of DME          PT Treatment Interventions DME instruction;Gait training;Functional mobility training;Therapeutic activities;Therapeutic exercise;Balance training;Neuromuscular re-education;Patient/family education    PT Goals (Current goals can be found in the Care Plan section)  Acute Rehab PT Goals Patient Stated Goal: Back to  work PT Goal Formulation: With patient Time For Goal Achievement: 11/25/16 Potential to Achieve Goals: Good    Frequency Min 5X/week   Barriers to discharge        Co-evaluation               End of Session Equipment Utilized During Treatment: Gait belt;Cervical collar Activity Tolerance: Patient tolerated treatment well Patient left: in chair;with call bell/phone within reach;with chair alarm set Nurse Communication: Mobility status         Time: 1351-1411 PT Time Calculation (min) (ACUTE ONLY): 20 min   Charges:   PT Evaluation $PT Eval Moderate Complexity: 1 Procedure     PT G CodesCatarina Hartshorn, PT  [phone] 11/18/2016, 2:57 PM

## 2016-11-18 NOTE — Progress Notes (Signed)
CRITICAL VALUE ALERT  Critical value received:  Troponin .36  Date of notification:  11/18/16  Time of notification:  2020  Critical value read back:Yes.    Nurse who received alert:  TK  MD notified (1st page):  Yes  Time of first page:  2028  MD notified (2nd page):  Time of second page:  Responding MD:  CCM  Time MD responded:  2028

## 2016-11-19 LAB — BASIC METABOLIC PANEL
ANION GAP: 9 (ref 5–15)
BUN: 31 mg/dL — ABNORMAL HIGH (ref 6–20)
CALCIUM: 7.4 mg/dL — AB (ref 8.9–10.3)
CHLORIDE: 104 mmol/L (ref 101–111)
CO2: 19 mmol/L — AB (ref 22–32)
Creatinine, Ser: 1.15 mg/dL (ref 0.61–1.24)
GFR calc non Af Amer: >60 mL/min (ref >60)
Glucose, Bld: 144 mg/dL — ABNORMAL HIGH (ref 65–99)
Potassium: 4.2 mmol/L (ref 3.5–5.1)
Sodium: 132 mmol/L — ABNORMAL LOW (ref 135–145)

## 2016-11-19 LAB — GLUCOSE, CAPILLARY
GLUCOSE-CAPILLARY: 136 mg/dL — AB (ref 65–99)
GLUCOSE-CAPILLARY: 153 mg/dL — AB (ref 65–99)
GLUCOSE-CAPILLARY: 135 mg/dL — AB (ref 65–99)
GLUCOSE-CAPILLARY: 161 mg/dL — AB (ref 65–99)
GLUCOSE-CAPILLARY: 108 mg/dL — AB (ref 65–99)
Glucose-Capillary: 144 mg/dL — ABNORMAL HIGH (ref 65–99)

## 2016-11-19 LAB — CBC
HCT: 28.9 % — ABNORMAL LOW (ref 39.0–52.0)
HEMOGLOBIN: 9.8 g/dL — AB (ref 13.0–17.0)
MCH: 29.8 pg (ref 26.0–34.0)
MCHC: 33.9 g/dL (ref 30.0–36.0)
MCV: 87.8 fL (ref 78.0–100.0)
Platelets: 164 10*3/uL (ref 150–400)
RBC: 3.29 MIL/uL — ABNORMAL LOW (ref 4.22–5.81)
RDW: 13.8 % (ref 11.5–15.5)
WBC: 10.3 10*3/uL (ref 4.0–10.5)

## 2016-11-19 LAB — TROPONIN I
TROPONIN I: 0.3 ng/mL — AB (ref <0.03)
TROPONIN I: 0.36 ng/mL — AB (ref <0.03)

## 2016-11-19 LAB — HCV COMMENT:

## 2016-11-19 LAB — HEPATITIS C ANTIBODY (REFLEX)

## 2016-11-19 NOTE — Progress Notes (Addendum)
Physical Therapy Treatment Patient Details Name: Thomas Nielsen MRN: [id number] DOB: 05-Aug-1956 Today's Date: 11/19/2016    History of Present Illness pt presents with C6-7 Epidural Abscess s/p ACDF and evacuation.  pt with hx of CKD, HTN, and Glomerulonephritis.      PT Comments    Spoke with nursing who cleared patient for PT session, after discussion about abnormal lab values.  Patient demonstrates functional mobility improvement since prior PT visit.  Patient using RW for mobility as precaution, requested by staff due to acute status.  Supervision for general mobility and gait, MIN Guard for stairs, although patient reports he has no stairs at home anyway.  Patient exhibiting medical > physical barriers at this time.  Difficulty being able to initiate urine, and pending PICC line placement.  Patient does remain appropriate for skilled PT intervention and will continue on PT roster at this time.  Follow Up Recommendations  Home health PT;Supervision - Intermittent     Equipment Recommendations  None recommended by PT    Recommendations for Other Services       Precautions / Restrictions Precautions Precautions: Fall Precaution Comments: reviewed cervical precautions  Required Braces or Orthoses: Cervical Brace Cervical Brace: Hard collar;At all times Restrictions Weight Bearing Restrictions: No    Mobility  Bed Mobility Overal bed mobility: Modified Independent             General bed mobility comments: pt needs increased time, but no A or cueing needed.    Transfers Overall transfer level: Needs assistance Equipment used: Rolling walker (2 wheeled) Transfers: Sit to/from Stand Sit to Stand: Supervision         General transfer comment: Cues for safety  Ambulation/Gait Ambulation/Gait assistance: Supervision   Assistive device: Rolling walker (2 wheeled) Gait Pattern/deviations: Step-through pattern;Decreased stride length Gait velocity: decreased Gait  velocity interpretation: Below normal speed for age/gender     Stairs Stairs: Yes   Stair Management: One rail Right;Alternating pattern Number of Stairs: 9 General stair comments: No stairs at home  Wheelchair Mobility    Modified Rankin (Stroke Patients Only)       Balance Overall balance assessment: Needs assistance Sitting-balance support: No upper extremity supported;Feet supported Sitting balance-Leahy Scale: Good     Standing balance support: Single extremity supported;During functional activity;No upper extremity supported Standing balance-Leahy Scale: Fair                      Cognition Arousal/Alertness: Awake/alert Behavior During Therapy: WFL for tasks assessed/performed Overall Cognitive Status: Within Functional Limits for tasks assessed                      Exercises      General Comments        Pertinent Vitals/Pain Pain Assessment: No/denies pain    Home Living                      Prior Function            PT Goals (current goals can now be found in the care plan section) Acute Rehab PT Goals Patient Stated Goal: Back to work PT Goal Formulation: With patient Time For Goal Achievement: 11/25/16 Potential to Achieve Goals: Good Progress towards PT goals: Progressing toward goals    Frequency    Min 5X/week      PT Plan Current plan remains appropriate    Co-evaluation  End of Session Equipment Utilized During Treatment: Gait belt;Cervical collar Activity Tolerance: Patient tolerated treatment well Patient left: in chair;with call bell/phone within reach     Time: 1115-1150 PT Time Calculation (min) (ACUTE ONLY): 35 min  Charges:  $Gait Training: 8-22 mins $Therapeutic Activity: 8-22 mins                    G Codes:      Thomas Nielsen 12/03/16, 12:42 PM

## 2016-11-19 NOTE — Progress Notes (Signed)
Patient ID: Thomas Nielsen, male   DOB: 02-Apr-1956, 61 y.o.   MRN: [id number] Reports significant improvement in upper extremities.  Neurologic stable incision clean dry and intact  Culture results consistent with staph infectious disease has placed the patient on Rocephin single agent therapy. We'll await their rounding this morning patient will need PICC line and long-term IV antibiotics.

## 2016-11-19 NOTE — Progress Notes (Signed)
Subjective: No new complaints, is tolerating the ceftriaxone without problems   Antibiotics:  Anti-infectives    Start     Dose/Rate Route Frequency Ordered Stop   11/18/16 1100  cefTRIAXone (ROCEPHIN) 2 g in dextrose 5 % 50 mL IVPB     2 g 100 mL/hr over 30 Minutes Intravenous Every 24 hours 11/18/16 1018     11/18/16 0200  vancomycin (VANCOCIN) IVPB 750 mg/150 ml premix  Status:  Discontinued     750 mg 150 mL/hr over 60 Minutes Intravenous Every 12 hours 11/17/16 1736 11/18/16 1018   11/17/16 1830  aztreonam (AZACTAM) 2 g in dextrose 5 % 50 mL IVPB  Status:  Discontinued     2 g 100 mL/hr over 30 Minutes Intravenous Every 8 hours 11/17/16 1736 11/18/16 0816   11/17/16 1607  bacitracin 50,000 Units in sodium chloride irrigation 0.9 % 500 mL irrigation  Status:  Discontinued       As needed 11/17/16 1608 11/17/16 1629   11/17/16 1427  vancomycin (VANCOCIN) 1-5 GM/200ML-% IVPB    Comments:  Flowers, [place]   : cabinet override      11/17/16 1427 11/18/16 0229      Medications: Scheduled Meds: . calcium carbonate  1 tablet Oral Q breakfast  . cefTRIAXone (ROCEPHIN)  IV  2 g Intravenous Q24H  . enalapril  10 mg Oral Daily  . insulin aspart  0-9 Units Subcutaneous Q4H  . predniSONE  5 mg Oral Daily  . senna  1 tablet Oral BID  . sodium chloride flush  3 mL Intravenous Q12H   Continuous Infusions: . sodium chloride Stopped (11/17/16 2300)  . 0.9 % NaCl with KCl 20 mEq / L 75 mL/hr at 11/18/16 2200   PRN Meds:.acetaminophen **OR** acetaminophen, diphenhydrAMINE, menthol-cetylpyridinium **OR** phenol, methocarbamol **OR** methocarbamol (ROBAXIN)  IV, morphine injection, ondansetron (ZOFRAN) IV, oxyCODONE, sodium chloride flush    Objective: Weight change: 26 lb 13.7 oz (12.2 kg)  Intake/Output Summary (Last 24 hours) at 11/19/16 1250 Last data filed at 11/19/16 0604  Gross per 24 hour  Intake             1691 ml  Output             2275 ml  Net              -584 ml   Blood pressure 119/75, pulse 92, temperature 97.9 F (36.6 C), temperature source Oral, resp. rate 18, height 5\' 11"  (1.803 m), weight 198 lb 13.7 oz (90.2 kg), SpO2 97 %. Temp:  [97.9 F (36.6 C)-99.9 F (37.7 C)] 97.9 F (36.6 C) (02/17 0954) Pulse Rate:  [80-95] 92 (02/17 0954) Resp:  [15-21] 18 (02/17 0954) BP: (111-142)/(67-124) 119/75 (02/17 0954) SpO2:  [94 %-98 %] 97 % (02/17 0954) Weight:  [198 lb 13.7 oz (90.2 kg)] 198 lb 13.7 oz (90.2 kg) (02/16 2304)  Physical Exam: General: Alert and awake, oriented x3, not in any acute distress. HEENT: anicteric sclera, pupils reactive to light and accommodation, EOMI CVS regular rate, normal r,  no murmur rubs or gallops Chest: clear to auscultation bilaterally, no wheezing, rales or rhonchi Abdomen: soft nontender, nondistended, normal bowel sounds, Extremities: no  clubbing or edema noted bilaterally Skin: no rashes Lymph: no new lymphadenopathy Neuro: nonfocal  CBC: CBC Latest Ref Rng & Units 11/19/2016 11/18/2016 11/17/2016  WBC 4.0 - 10.5 K/uL 10.3 10.1 -  Hemoglobin 13.0 - 17.0 g/dL 9.8(L) 10.4(L) 12.6(L)  Hematocrit 39.0 - 52.0 % 28.9(L) 30.6(L) 37.0(L)  Platelets 150 - 400 K/uL 164 133(L) -     BMET  Recent Labs  11/18/16 0320 11/19/16 0455  NA 132* 132*  K 4.1 4.2  CL 100* 104  CO2 21* 19*  GLUCOSE 206* 144*  BUN 25* 31*  CREATININE 1.36* 1.15  CALCIUM 7.3* 7.4*     Liver Panel   Recent Labs  11/17/16 0802  PROT 6.9  ALBUMIN 3.3*  AST 18  ALT 13*  ALKPHOS 37*  BILITOT 1.3*       Sedimentation Rate  Recent Labs  11/18/16 0320  ESRSEDRATE 94*   C-Reactive Protein  Recent Labs  11/18/16 0320  CRP 32.6*    Micro Results: Recent Results (from the past 720 hour(s))  Blood Culture (routine x 2)     Status: Abnormal (Preliminary result)   Collection Time: 11/17/16  8:10 AM  Result Value Ref Range Status   Specimen Description BLOOD LEFT FOREARM  Final   Special Requests  BOTTLES DRAWN AEROBIC AND ANAEROBIC 5CC  Final   Culture  Setup Time   Final    GRAM POSITIVE COCCI IN CLUSTERS IN BOTH AEROBIC AND ANAEROBIC BOTTLES CRITICAL RESULT CALLED TO, READ BACK BY AND VERIFIED WITH: GREG ABBOTT, PHARMD @0050  11/18/16 MKELLY,MLT    Culture (A)  Final    STAPHYLOCOCCUS AUREUS SUSCEPTIBILITIES TO FOLLOW Performed at [facility], [place] [address], [place], [place] [address]    Report Status PENDING  Incomplete  Blood Culture ID Panel (Reflexed)     Status: Abnormal   Collection Time: 11/17/16  8:10 AM  Result Value Ref Range Status   Enterococcus species NOT DETECTED NOT DETECTED Final   Listeria monocytogenes NOT DETECTED NOT DETECTED Final   Staphylococcus species DETECTED (A) NOT DETECTED Final    Comment: CRITICAL RESULT CALLED TO, READ BACK BY AND VERIFIED WITH: GREG ABBOTT, PHARMD @0050  11/18/16 MKELLY,MLT    Staphylococcus aureus DETECTED (A) NOT DETECTED Final    Comment: Methicillin (oxacillin) susceptible Staphylococcus aureus (MSSA). Preferred therapy is anti staphylococcal beta lactam antibiotic (Cefazolin or Nafcillin), unless clinically contraindicated. CRITICAL RESULT CALLED TO, READ BACK BY AND VERIFIED WITH: GREG ABBOTT, PHARMD @0050  02/16/1/8 MKELLY,MLT    Methicillin resistance NOT DETECTED NOT DETECTED Final   Streptococcus species NOT DETECTED NOT DETECTED Final   Streptococcus agalactiae NOT DETECTED NOT DETECTED Final   Streptococcus pneumoniae NOT DETECTED NOT DETECTED Final   Streptococcus pyogenes NOT DETECTED NOT DETECTED Final   Acinetobacter baumannii NOT DETECTED NOT DETECTED Final   Enterobacteriaceae species NOT DETECTED NOT DETECTED Final   Enterobacter cloacae complex NOT DETECTED NOT DETECTED Final   Escherichia coli NOT DETECTED NOT DETECTED Final   Klebsiella oxytoca NOT DETECTED NOT DETECTED Final   Klebsiella pneumoniae NOT DETECTED NOT DETECTED Final   Proteus species NOT DETECTED NOT DETECTED Final   Serratia  marcescens NOT DETECTED NOT DETECTED Final   Haemophilus influenzae NOT DETECTED NOT DETECTED Final   Neisseria meningitidis NOT DETECTED NOT DETECTED Final   Pseudomonas aeruginosa NOT DETECTED NOT DETECTED Final   Candida albicans NOT DETECTED NOT DETECTED Final   Candida glabrata NOT DETECTED NOT DETECTED Final   Candida krusei NOT DETECTED NOT DETECTED Final   Candida parapsilosis NOT DETECTED NOT DETECTED Final   Candida tropicalis NOT DETECTED NOT DETECTED Final    Comment: Performed at [facility], [place]. [address], [place], [place] [address]  Blood Culture (routine x 2)  Status: Abnormal (Preliminary result)   Collection Time: 11/17/16  8:45 AM  Result Value Ref Range Status   Specimen Description BLOOD RIGHT FOREARM  Final   Special Requests BOTTLES DRAWN AEROBIC AND ANAEROBIC 5CC  Final   Culture  Setup Time   Final    GRAM POSITIVE COCCI IN CLUSTERS IN BOTH AEROBIC AND ANAEROBIC BOTTLES CRITICAL VALUE NOTED.  VALUE IS CONSISTENT WITH PREVIOUSLY REPORTED AND CALLED VALUE. Performed at [facility], [place] [address], [place], [place] [address]    Culture STAPHYLOCOCCUS AUREUS (A)  Final   Report Status PENDING  Incomplete  Surgical pcr screen     Status: Abnormal   Collection Time: 11/17/16  1:14 PM  Result Value Ref Range Status   MRSA, PCR NEGATIVE NEGATIVE Final   Staphylococcus aureus POSITIVE (A) NEGATIVE Final    Comment:        The Xpert SA Assay (FDA approved for NASAL specimens in patients over 13 years of age), is one component of a comprehensive surveillance program.  Test performance has been validated by [facility] for patients greater than or equal to 62 year old. It is not intended to diagnose infection nor to guide or monitor treatment.   Aerobic/Anaerobic Culture (surgical/deep wound)     Status: None (Preliminary result)   Collection Time: 11/17/16  3:16 PM  Result Value Ref Range Status   Specimen Description ABSCESS CERVICAL  EPIDURAL  Final   Special Requests NONE  Final   Gram Stain   Final    FEW WBC PRESENT, PREDOMINANTLY PMN ABUNDANT GRAM POSITIVE COCCI IN CLUSTERS    Culture ABUNDANT STAPHYLOCOCCUS AUREUS  Final   Report Status PENDING  Incomplete  Aerobic/Anaerobic Culture (surgical/deep wound)     Status: None (Preliminary result)   Collection Time: 11/17/16  3:23 PM  Result Value Ref Range Status   Specimen Description ABSCESS SOFT TISSUE  Final   Special Requests CERVICAL EPIDURAL ABSCESS  Final   Gram Stain   Final    FEW WBC PRESENT, PREDOMINANTLY PMN ABUNDANT GRAM POSITIVE COCCI IN PAIRS MODERATE GRAM POSITIVE COCCI IN CLUSTERS    Culture ABUNDANT STAPHYLOCOCCUS AUREUS  Final   Report Status PENDING  Incomplete  Culture, blood (Routine X 2) w Reflex to ID Panel     Status: None (Preliminary result)   Collection Time: 11/18/16 11:14 AM  Result Value Ref Range Status   Specimen Description BLOOD LEFT ARM  Final   Special Requests BOTTLES DRAWN AEROBIC AND ANAEROBIC 10CC EA  Final   Culture NO GROWTH < 24 HOURS  Final   Report Status PENDING  Incomplete  Culture, blood (Routine X 2) w Reflex to ID Panel     Status: None (Preliminary result)   Collection Time: 11/18/16 11:19 AM  Result Value Ref Range Status   Specimen Description BLOOD LEFT ANTECUBITAL  Final   Special Requests BOTTLES DRAWN AEROBIC AND ANAEROBIC 10CC EA  Final   Culture NO GROWTH < 24 HOURS  Final   Report Status PENDING  Incomplete    Studies/Results: Dg Cervical Spine 2-3 Views  Result Date: 11/17/2016 CLINICAL DATA:  Intraoperative imaging for C6-7 ACDF. EXAM: CERVICAL SPINE - 2-3 VIEW; DG C-ARM 61-120 MIN COMPARISON:  MRI cervical spine earlier today. FINDINGS: Two fluoroscopic intraoperative spot views of the cervical spine in the lateral projection demonstrate anterior plate and screws interbody spacer in place at C6-7. No acute finding. IMPRESSION: C6-7 ACDF. Electronically Signed   By: Inge Rise M.D.  On:  11/17/2016 16:32   Dg C-arm 1-60 Min  Result Date: 11/17/2016 CLINICAL DATA:  Intraoperative imaging for C6-7 ACDF. EXAM: CERVICAL SPINE - 2-3 VIEW; DG C-ARM 61-120 MIN COMPARISON:  MRI cervical spine earlier today. FINDINGS: Two fluoroscopic intraoperative spot views of the cervical spine in the lateral projection demonstrate anterior plate and screws interbody spacer in place at C6-7. No acute finding. IMPRESSION: C6-7 ACDF. Electronically Signed   By: Inge Rise M.D.   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 male with   61 y.o. 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  Specific aspects of clinical management are critical to optimizing the outcome of patients with SAB.  Therefore, the [facility] Antimicrobial Management Team [facility]) has initiated an intervention aimed at improving the management of SAB at [facility].  To do so, Infectious Diseases physicians are providing an evidence-based consult for the management of all patients with SAB.     Yes No Comments  Perform follow-up blood cultures (even if the patient is afebrile) to ensure clearance of  bacteremia [x]  []  Repeat blood cultures taken yesterday  Remove vascular catheter and obtain follow-up blood cultures after the removal of the catheter []  []  DO NOT PLACE PICC OR CENTRAL LINE UNTIL WE HAVE PROVEN CLEARANCE OF BACTEREMIA WHICH MAY TAKE ANOTHER 4-5 days  Perform echocardiography to evaluate for endocarditis (transthoracic ECHO is 40-50% sensitive, TEE is > 90% sensitive) []  []  Please keep in mind, that neither test can definitively EXCLUDE endocarditis, and that should clinical suspicion remain high for endocarditis the patient should then still be treated with an "endocarditis" duration of therapy = 6 weeks  2-D echocardiogram has been performed results are pending transesophageal echocardiogram would normally be more ideal for defining potential endocarditis but I think that is going to be probably not problematic given his recent cervical spine surgery.   Consult electrophysiologist to evaluate implanted cardiac device (pacemaker, ICD) []  []    Ensure source control []  []  Have all abscesses been drained effectively? Have deep seeded infections (septic joints or osteomyelitis) had appropriate surgical debridement?  He has had neurosurgery   Investigate for "metastatic" sites of infection []  []  Does the patient have ANY symptom or physical exam finding that would suggest a deeper infection (back or neck pain that may be suggestive of vertebral osteomyelitis or epidural abscess, muscle pain that could be a symptom of pyomyositis)?  Keep in mind that for deep seeded infections MRI imaging with contrast  is preferred rather than other often insensitive tests such as plain x-rays, especially early in a patient's presentation.  There are no other clear-cut "metastatic" sites based on his symptoms   Change antibiotic therapy to Ceftriaxone 2 g daily   Plus likely oral rifampin once his bloodstream cultures have cleared  []  []  Beta-lactam antibiotics are preferred for MSSA due to higher  cure rates.   If on Vancomycin, goal trough should be 15 - 20 mcg/mL  Estimated duration of IV antibiotic therapy:  6-8 weeks  []  []  Consult case management for probably prolonged outpatient IV antibiotic therapy      LOS: 2 days   Alcide Evener 11/19/2016, 12:50 PM

## 2016-11-19 NOTE — Progress Notes (Signed)
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  pt with hx of CKD, HTN, and Glomerulonephritis.     OT comments  Pt is able to perform ADLs with supervision, but requires mod verbal cues for cervical precautions - he does not generalize information from one activity to another.   Would recommend use of [place] collar for showering as pt will most likely flex/ext and rotate neck if not in collar.   Will continue to follow.     Follow Up Recommendations  No OT follow up;Supervision/Assistance - 24 hour    Equipment Recommendations  None recommended by OT    Recommendations for Other Services      Precautions / Restrictions Precautions Precautions: Fall Precaution Comments: reviewed cervical precautions - requires mod cues to adhere to them.  He demonstrates poor carry over of precautions from activity to activity Required Braces or Orthoses: Cervical Brace Cervical Brace: Hard collar;At all times Restrictions Weight Bearing Restrictions: No       Mobility Bed Mobility Overal bed mobility: Modified Independent             General bed mobility comments: pt needs increased time, but no A or cueing needed.    Transfers Overall transfer level: Needs assistance Equipment used: Rolling walker (2 wheeled) Transfers: Stand Pivot Transfers;Sit to/from Stand Sit to Stand: Supervision Stand pivot transfers: Supervision       General transfer comment: Cues for safety    Balance Overall balance assessment: Needs assistance Sitting-balance support: No upper extremity supported;Feet supported Sitting balance-Leahy Scale: Good     Standing balance support: Single extremity supported;During functional activity;No upper extremity supported Standing balance-Leahy Scale: Fair                     ADL  Overall ADL's : Needs assistance/impaired Eating/Feeding: Modified independent   Grooming: Wash/dry hands;Wash/dry face;Oral care;Supervision/safety;Standing Grooming Details (indicate cue type and reason): mod verbal cues for cervical precautions.   He will follow them when cued, but then will proceed with activity in normal fashion bending forward and attempting to flex and rotate head/neck      Lower Body Bathing: Supervison/ safety;Sit to/from stand       Lower Body Dressing: Supervision/safety;Sit to/from stand   Toilet Transfer: Supervision/safety;Ambulation;Comfort height toilet;Grab bars;RW Armed forces technical officer Details (indicate cue type and reason): verbal cues for walker safety as he pushes it to the side before approaching toilet  Toileting- Clothing Manipulation and Hygiene: Supervision/safety;Sit to/from stand       Functional mobility during ADLs: Supervision/safety;Rolling walker        Vision                     Perception     Praxis      Cognition   Behavior During Therapy: [facility] for tasks assessed/performed Overall Cognitive Status: No family/caregiver present to determine baseline cognitive functioning                  General Comments: poor carry over of precautions.  pt very internally distracted     Extremity/Trunk Assessment               Exercises     Shoulder Instructions       General Comments      Pertinent Vitals/ Pain  Pain Assessment: No/denies pain  Home Living                                          Prior Functioning/Environment              Frequency  Min 2X/week        Progress Toward Goals  OT Goals(current goals can now be found in the care plan section)  Progress towards OT goals: Progressing toward goals  Acute Rehab OT Goals Patient Stated Goal: Back to work  Plan Discharge plan remains appropriate    Co-evaluation                 End of Session Equipment  Utilized During Treatment: Rolling walker   Activity Tolerance Patient tolerated treatment well   Patient Left     Nurse Communication Mobility status        Time: [id number] OT Time Calculation (min): 30 min  Charges: OT General Charges $OT Visit: 1 Procedure OT Treatments $Self Care/Home Management : 23-37 mins  Bevely Hackbart M 11/19/2016, 2:08 PM

## 2016-11-20 ENCOUNTER — Inpatient Hospital Stay (HOSPITAL_COMMUNITY): Payer: BLUE CROSS/BLUE SHIELD

## 2016-11-20 LAB — GLUCOSE, CAPILLARY
GLUCOSE-CAPILLARY: 97 mg/dL (ref 65–99)
Glucose-Capillary: 118 mg/dL — ABNORMAL HIGH (ref 65–99)
Glucose-Capillary: 114 mg/dL — ABNORMAL HIGH (ref 65–99)
Glucose-Capillary: 136 mg/dL — ABNORMAL HIGH (ref 65–99)
Glucose-Capillary: 119 mg/dL — ABNORMAL HIGH (ref 65–99)

## 2016-11-20 LAB — URINALYSIS, ROUTINE W REFLEX MICROSCOPIC
BILIRUBIN URINE: NEGATIVE
GLUCOSE, UA: NEGATIVE mg/dL
KETONES UR: NEGATIVE mg/dL
NITRITE: NEGATIVE
PH: 5 (ref 5.0–8.0)
Protein, ur: 30 mg/dL — AB
SPECIFIC GRAVITY, URINE: 1.006 (ref 1.005–1.030)
SQUAMOUS EPITHELIAL / LPF: NONE SEEN

## 2016-11-20 LAB — BLOOD CULTURE ID PANEL (REFLEXED)
ACINETOBACTER BAUMANNII: NOT DETECTED
CANDIDA KRUSEI: NOT DETECTED
Candida albicans: NOT DETECTED
Candida glabrata: NOT DETECTED
Candida parapsilosis: NOT DETECTED
Candida tropicalis: NOT DETECTED
ENTEROBACTER CLOACAE COMPLEX: NOT DETECTED
ENTEROCOCCUS SPECIES: NOT DETECTED
Enterobacteriaceae species: NOT DETECTED
Escherichia coli: NOT DETECTED
Haemophilus influenzae: NOT DETECTED
Klebsiella oxytoca: NOT DETECTED
Klebsiella pneumoniae: NOT DETECTED
LISTERIA MONOCYTOGENES: NOT DETECTED
METHICILLIN RESISTANCE: NOT DETECTED
NEISSERIA MENINGITIDIS: NOT DETECTED
PROTEUS SPECIES: NOT DETECTED
PSEUDOMONAS AERUGINOSA: NOT DETECTED
SERRATIA MARCESCENS: NOT DETECTED
STAPHYLOCOCCUS AUREUS BCID: DETECTED — AB
STAPHYLOCOCCUS SPECIES: DETECTED — AB
STREPTOCOCCUS PNEUMONIAE: NOT DETECTED
STREPTOCOCCUS SPECIES: NOT DETECTED
Streptococcus agalactiae: NOT DETECTED
Streptococcus pyogenes: NOT DETECTED

## 2016-11-20 LAB — CULTURE, BLOOD (ROUTINE X 2)

## 2016-11-20 LAB — HEPATITIS B SURFACE ANTIGEN: Hepatitis B Surface Ag: NEGATIVE

## 2016-11-20 MED ORDER — INSULIN ASPART 100 UNIT/ML ~~LOC~~ SOLN
0.0000 [IU] | Freq: Three times a day (TID) | SUBCUTANEOUS | Status: DC
  Administered 2016-11-20: 1 [IU] via SUBCUTANEOUS
  Administered 2016-11-21: 2 [IU] via SUBCUTANEOUS
  Administered 2016-11-22: 1 [IU] via SUBCUTANEOUS

## 2016-11-20 MED ORDER — INSULIN ASPART 100 UNIT/ML ~~LOC~~ SOLN: 0.0000 [IU] | Freq: Every day | SUBCUTANEOUS | Status: DC

## 2016-11-20 NOTE — Progress Notes (Signed)
Patient with PERSISTENTLY POSITIVE BLOOD CULTURES  REPEAT BLOOD CULTURES TODAY  NO PICC LINE AND NO CENTRAL LINE UNTIL REPEAT BLOOD CULTURES ARE STERILE AT 4 DAYS MINIMUM   HE COULD ALSO HAVE ENDOCARDITIS AS REASON HE IS FAILING TO CLEAR  BUT AS MENTIONED BEFORE TEE WILL NOT LIKELY BE EASY AND DURATION IS GIONG TO BE SAME AS FOR ENDOCARDITIS  ONLY DIFFERENCE TEE WOULD MAKE WOULD BE IF HE REQUIRED VALVE SURGERY  Dr. Baxter Flattery to pick up the service tomorrow.

## 2016-11-20 NOTE — Progress Notes (Signed)
Pt continues to refuse pain medication, he is still not voiding on his own he said he voided around 2200 2/17 which was not very much he said he was not able to create a stream, bladder scanned him a little later he was holding over 900, performed in/out and 1000 was removed. Will continue to monitor.

## 2016-11-20 NOTE — Progress Notes (Signed)
Patient ID: Thomas Nielsen, male   DOB: Jan 29, 1956, 61 y.o.   MRN: [id number] Vital signs are stable Patient having difficulties with voiding Has chronic retention Foley being placed today to leave in place for a couple days Culture results show methicillin sensitive staph aureus Currently on antibiotics but need to wait a few days to make sure bacteremia is cleared before he can have a PICC line Continues present therapy

## 2016-11-20 NOTE — Progress Notes (Signed)
PHARMACY - PHYSICIAN COMMUNICATION CRITICAL VALUE ALERT - BLOOD CULTURE IDENTIFICATION (BCID)  Results for orders placed or performed during the hospital encounter of 11/17/16  Blood Culture ID Panel (Reflexed) (Collected: 11/18/2016 11:14 AM)  Result Value Ref Range   Enterococcus species NOT DETECTED NOT DETECTED   Listeria monocytogenes NOT DETECTED NOT DETECTED   Staphylococcus species DETECTED (A) NOT DETECTED   Staphylococcus aureus DETECTED (A) NOT DETECTED   Methicillin resistance NOT DETECTED NOT DETECTED   Streptococcus species NOT DETECTED NOT DETECTED   Streptococcus agalactiae NOT DETECTED NOT DETECTED   Streptococcus pneumoniae NOT DETECTED NOT DETECTED   Streptococcus pyogenes NOT DETECTED NOT DETECTED   Acinetobacter baumannii NOT DETECTED NOT DETECTED   Enterobacteriaceae species NOT DETECTED NOT DETECTED   Enterobacter cloacae complex NOT DETECTED NOT DETECTED   Escherichia coli NOT DETECTED NOT DETECTED   Klebsiella oxytoca NOT DETECTED NOT DETECTED   Klebsiella pneumoniae NOT DETECTED NOT DETECTED   Proteus species NOT DETECTED NOT DETECTED   Serratia marcescens NOT DETECTED NOT DETECTED   Haemophilus influenzae NOT DETECTED NOT DETECTED   Neisseria meningitidis NOT DETECTED NOT DETECTED   Pseudomonas aeruginosa NOT DETECTED NOT DETECTED   Candida albicans NOT DETECTED NOT DETECTED   Candida glabrata NOT DETECTED NOT DETECTED   Candida krusei NOT DETECTED NOT DETECTED   Candida parapsilosis NOT DETECTED NOT DETECTED   Candida tropicalis NOT DETECTED NOT DETECTED    Changes to prescribed antibiotics required: Patient with known MSSA bacteremia already being seen by ID. Patient is on ceftriaxone 2g IV q24h.   Dimitri Ped, PharmD, BCPS PGY-2 Infectious Diseases Pharmacy Resident Pager: [phone] 11/20/2016  10:01 AM

## 2016-11-21 DIAGNOSIS — D693 Immune thrombocytopenic purpura: Secondary | ICD-10-CM

## 2016-11-21 DIAGNOSIS — Z7952 Long term (current) use of systemic steroids: Secondary | ICD-10-CM

## 2016-11-21 DIAGNOSIS — Z981 Arthrodesis status: Secondary | ICD-10-CM

## 2016-11-21 DIAGNOSIS — Z880 Allergy status to penicillin: Secondary | ICD-10-CM

## 2016-11-21 DIAGNOSIS — N189 Chronic kidney disease, unspecified: Secondary | ICD-10-CM

## 2016-11-21 DIAGNOSIS — A4101 Sepsis due to Methicillin susceptible Staphylococcus aureus: Secondary | ICD-10-CM

## 2016-11-21 DIAGNOSIS — B9561 Methicillin susceptible Staphylococcus aureus infection as the cause of diseases classified elsewhere: Secondary | ICD-10-CM

## 2016-11-21 DIAGNOSIS — N059 Unspecified nephritic syndrome with unspecified morphologic changes: Secondary | ICD-10-CM

## 2016-11-21 DIAGNOSIS — G061 Intraspinal abscess and granuloma: Secondary | ICD-10-CM

## 2016-11-21 DIAGNOSIS — Z888 Allergy status to other drugs, medicaments and biological substances status: Secondary | ICD-10-CM

## 2016-11-21 LAB — CBC
HCT: 31.9 % — ABNORMAL LOW (ref 39.0–52.0)
Hemoglobin: 11.3 g/dL — ABNORMAL LOW (ref 13.0–17.0)
MCH: 30.9 pg (ref 26.0–34.0)
MCHC: 35.4 g/dL (ref 30.0–36.0)
MCV: 87.2 fL (ref 78.0–100.0)
PLATELETS: 264 10*3/uL (ref 150–400)
RBC: 3.66 MIL/uL — ABNORMAL LOW (ref 4.22–5.81)
RDW: 13.8 % (ref 11.5–15.5)
WBC: 10.9 10*3/uL — AB (ref 4.0–10.5)

## 2016-11-21 LAB — BASIC METABOLIC PANEL
Anion gap: 11 (ref 5–15)
BUN: 26 mg/dL — AB (ref 6–20)
CALCIUM: 8.6 mg/dL — AB (ref 8.9–10.3)
CO2: 23 mmol/L (ref 22–32)
Chloride: 96 mmol/L — ABNORMAL LOW (ref 101–111)
Creatinine, Ser: 1.26 mg/dL — ABNORMAL HIGH (ref 0.61–1.24)
GFR calc Af Amer: >60 mL/min (ref >60)
GFR, EST NON AFRICAN AMERICAN: 59 mL/min — AB (ref >60)
Glucose, Bld: 148 mg/dL — ABNORMAL HIGH (ref 65–99)
Potassium: 4.3 mmol/L (ref 3.5–5.1)
SODIUM: 130 mmol/L — AB (ref 135–145)

## 2016-11-21 LAB — GLUCOSE, CAPILLARY
GLUCOSE-CAPILLARY: 109 mg/dL — AB (ref 65–99)
GLUCOSE-CAPILLARY: 174 mg/dL — AB (ref 65–99)
Glucose-Capillary: 113 mg/dL — ABNORMAL HIGH (ref 65–99)
Glucose-Capillary: 141 mg/dL — ABNORMAL HIGH (ref 65–99)

## 2016-11-21 MED ORDER — RIFAMPIN 300 MG PO CAPS
300.0000 mg | ORAL_CAPSULE | Freq: Two times a day (BID) | ORAL | Status: DC
  Administered 2016-11-22 – 2016-11-23 (×3): 300 mg via ORAL
  Filled 2016-11-21 (×3): qty 1

## 2016-11-21 MED ORDER — MUPIROCIN 2 % EX OINT
1.0000 "application " | TOPICAL_OINTMENT | Freq: Two times a day (BID) | CUTANEOUS | Status: DC
  Administered 2016-11-21 – 2016-11-23 (×5): 1 via NASAL
  Filled 2016-11-21 (×2): qty 22

## 2016-11-21 MED ORDER — CHLORHEXIDINE GLUCONATE CLOTH 2 % EX PADS
6.0000 | MEDICATED_PAD | Freq: Every day | CUTANEOUS | Status: DC
  Administered 2016-11-21 – 2016-11-22 (×2): 6 via TOPICAL

## 2016-11-21 MED ORDER — TAMSULOSIN HCL 0.4 MG PO CAPS
0.4000 mg | ORAL_CAPSULE | Freq: Every day | ORAL | Status: DC
  Administered 2016-11-21 – 2016-11-23 (×3): 0.4 mg via ORAL
  Filled 2016-11-21 (×3): qty 1

## 2016-11-21 NOTE — Progress Notes (Signed)
Patient ID: Aurther Loft, male   DOB: [birth year], 61 y.o.   MRN: [id number] Subjective: Patient reports no real pain, hands working better, moving legs well  Objective: Vital signs in last 24 hours: Temp:  [98.9 F (37.2 C)-101.4 F (38.6 C)] 99.1 F (37.3 C) (02/19 0916) Pulse Rate:  [89-103] 103 (02/19 0916) Resp:  [16-18] 18 (02/19 0916) BP: (120-139)/(69-96) 120/80 (02/19 0916) SpO2:  [96 %-98 %] 96 % (02/19 0916)  Intake/Output from previous day: 02/18 0701 - 02/19 0700 In: 3 [I.V.:3] Out: 3850 [Urine:3850] Intake/Output this shift: Total I/O In: 240 [P.O.:240] Out: 1000 [Urine:1000]  Neurologic: Grossly normal  Lab Results: Lab Results  Component Value Date   WBC 10.3 11/19/2016   HGB 9.8 (L) 11/19/2016   HCT 28.9 (L) 11/19/2016   MCV 87.8 11/19/2016   PLT 164 11/19/2016   No results found for: INR, PROTIME BMET Lab Results  Component Value Date   NA 132 (L) 11/19/2016   K 4.2 11/19/2016   CL 104 11/19/2016   CO2 19 (L) 11/19/2016   GLUCOSE 144 (H) 11/19/2016   BUN 31 (H) 11/19/2016   CREATININE 1.15 11/19/2016   CALCIUM 7.4 (L) 11/19/2016    Studies/Results: Dg Chest Port 1 View  Result Date: 11/20/2016 CLINICAL DATA:  Fever EXAM: PORTABLE CHEST 1 VIEW COMPARISON:  11/17/2016 FINDINGS: Minimal atelectasis at the right base. No acute consolidation or effusion. Stable cardiomediastinal silhouette with atherosclerosis. No pneumothorax. Interim visualization of lower cervical hardware. IMPRESSION: Minimal atelectasis at the right base. No acute pulmonary infiltrate. Electronically Signed   By: Donavan Foil M.D.   On: 11/20/2016 19:29    Assessment/Plan: In collar, foley in place ("I have always has this problem after surgery"), flomax, _PT/OT, picc when able. continue abx per ID recs   LOS: 4 days    Alvenia Treese S 11/21/2016, 12:25 PM

## 2016-11-21 NOTE — Progress Notes (Signed)
Occupational Therapy Treatment Patient Details Name: Thomas Nielsen MRN: [id number] DOB: 04-Sep-1956 Today's Date: 11/21/2016    History of present illness pt presents with C6-7 Epidural Abscess s/p ACDF and evacuation.  pt with hx of CKD, HTN, and Glomerulonephritis.     OT comments  Pt making progress towards OT goals this session. Pt needed less verbal cues for cervical precautions. Pt able to demonstrate sink level grooming, LB dressing this session as well as good RW safety during mobility around the room. Pt continues to benefit from skilled OT in the acute setting. Next session to focus on tub transfer.  Follow Up Recommendations  No OT follow up;Supervision/Assistance - 24 hour    Equipment Recommendations  None recommended by OT (Pt has appropriate DME)    Recommendations for Other Services      Precautions / Restrictions Precautions Precautions: Fall;Cervical Precaution Comments: Pt able to recall 2/3 cervical precautions, re-educated Required Braces or Orthoses: Cervical Brace Cervical Brace: Hard collar;At all times ([place] collar for showering) Restrictions Weight Bearing Restrictions: No       Mobility Bed Mobility               General bed mobility comments: Pt sitting OOB in recliner when OT entered the room  Transfers Overall transfer level: Needs assistance Equipment used: Rolling walker (2 wheeled) Transfers: Sit to/from Stand Sit to Stand: Supervision         General transfer comment: good hand placement this session    Balance Overall balance assessment: Needs assistance Sitting-balance support: No upper extremity supported;Feet supported Sitting balance-Leahy Scale: Good Sitting balance - Comments: Able to sit on edge and don/doff socks with no LOB   Standing balance support: No upper extremity supported;During functional activity Standing balance-Leahy Scale: Good Standing balance comment: sink level grooming                    ADL Overall ADL's : Needs assistance/impaired     Grooming: Wash/dry hands;Supervision/safety;Standing Grooming Details (indicate cue type and reason): min cues for cervical precautions, educated on cup method for oral care             Lower Body Dressing: Supervision/safety;Sit to/from stand Lower Body Dressing Details (indicate cue type and reason): Pt able to don/doff BLE socks by crossing legs Toilet Transfer: Supervision/safety;Ambulation;Comfort height toilet;Grab bars;RW Armed forces technical officer Details (indicate cue type and reason): more awareness and good use of walker this session Toileting- Clothing Manipulation and Hygiene: Supervision/safety;Sit to/from stand       Functional mobility during ADLs: Supervision/safety;Rolling walker General ADL Comments: no LOB this session      Vision                     Perception     Praxis      Cognition   Behavior During Therapy: WFL for tasks assessed/performed Overall Cognitive Status: Within Functional Limits for tasks assessed                       Extremity/Trunk Assessment               Exercises     Shoulder Instructions       General Comments      Pertinent Vitals/ Pain       Pain Assessment: 0-10 Pain Score: 3  Pain Location: Neck, and BUE bicepts and elbows (states improves with movement) Pain Descriptors / Indicators: Sore Pain Intervention(s): Monitored during session;Repositioned  Home Living                                          Prior Functioning/Environment              Frequency  Min 2X/week        Progress Toward Goals  OT Goals(current goals can now be found in the care plan section)  Progress towards OT goals: Progressing toward goals  Acute Rehab OT Goals Patient Stated Goal: Back to work OT Goal Formulation: With patient Time For Goal Achievement: 11/25/16 Potential to Achieve Goals: Good  Plan Discharge plan remains appropriate     Co-evaluation                 End of Session Equipment Utilized During Treatment: Rolling walker   Activity Tolerance Patient tolerated treatment well   Patient Left in chair;with call bell/phone within reach   Nurse Communication Mobility status;Other (comment) (Urine collector is full from catheter)        Time: [id number] OT Time Calculation (min): 23 min  Charges: OT General Charges $OT Visit: 1 Procedure OT Treatments $Self Care/Home Management : 23-37 mins  Jaci Carrel 11/21/2016, 10:11 AM Hulda Humphrey OTR/L [phone]

## 2016-11-21 NOTE — Progress Notes (Addendum)
[facility] for Infectious Disease    Date of Admission:  11/17/2016   Total days of antibiotics 5        Day 4 ceftriaxone 2gm iv daily          ID: Thomas Nielsen is a 61 y.o. male with MSSA bacteremia and cervical spinal epidural abscess s/p ACDF and evacuation on 2/15, + tissue cx for MSSA. 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 He had repeat blood cx done yesterday. Has had loose stools but given laxatives recently.also reports still having urinary obstruction  Medications:  . calcium carbonate  1 tablet Oral Q breakfast  . cefTRIAXone (ROCEPHIN)  IV  2 g Intravenous Q24H  . Chlorhexidine Gluconate Cloth  6 each Topical Daily  . enalapril  10 mg Oral Daily  . insulin aspart  0-5 Units Subcutaneous QHS  . insulin aspart  0-9 Units Subcutaneous TID WC  . mupirocin ointment  1 application Nasal BID  . predniSONE  5 mg Oral Daily  . senna  1 tablet Oral BID  . sodium chloride flush  3 mL Intravenous Q12H  . tamsulosin  0.4 mg Oral Daily    Objective: Vital signs in last 24 hours: Temp:  [98.9 F (37.2 C)-101.4 F (38.6 C)] 98.9 F (37.2 C) (02/19 1423) Pulse Rate:  [89-103] 100 (02/19 1423) Resp:  [16-18] 18 (02/19 1423) BP: (120-139)/(73-96) 128/79 (02/19 1423) SpO2:  [96 %-98 %] 97 % (02/19 1423) Physical Exam  Constitutional: He is oriented to person, place, and time. He appears well-developed and well-nourished. No distress.  HENT: c-collar in place, dressing in place for recent surgery Mouth/Throat: Oropharynx is clear and moist. No oropharyngeal exudate.  Cardiovascular: Normal rate, regular rhythm and normal heart sounds. Exam reveals no gallop and no friction rub.  No murmur heard.  Pulmonary/Chest: Effort normal and breath  sounds normal. No respiratory distress. He has no wheezes.  Abdominal: Soft. Bowel sounds are normal. He exhibits no distension. There is no tenderness.  Neurological: He is alert and oriented to person, place, and time.  Skin: Skin is warm and dry. No rash noted. No erythema.  Psychiatric: He has a normal mood and affect. His behavior is normal.     Lab Results  Recent Labs  11/19/16 0455 11/21/16 1511  WBC 10.3 10.9*  HGB 9.8* 11.3*  HCT 28.9* 31.9*  NA 132* 130*  K 4.2 4.3  CL 104 96*  CO2 19* 23  BUN 31* 26*  CREATININE 1.15 1.26*   Lab Results  Component Value Date   ESRSEDRATE 94 (H) 11/18/2016   Lab Results  Component Value Date   CRP 32.6 (H) 11/18/2016    Microbiology: 2/15 cervical tissue + MSSA 2/15 blood cx MSSA 2/16 blood cx MSSA Studies/Results: Dg Chest Port 1 View  Result Date: 11/20/2016 CLINICAL DATA:  Fever EXAM: PORTABLE CHEST 1 VIEW COMPARISON:  11/17/2016 FINDINGS: Minimal atelectasis at the right base. No acute consolidation or effusion. Stable cardiomediastinal silhouette with atherosclerosis. No pneumothorax. Interim visualization of lower cervical hardware. IMPRESSION: Minimal atelectasis at the right base. No acute pulmonary infiltrate. Electronically Signed   By: Donavan Foil M.D.   On: 11/20/2016 19:29   tte on 2/16: -  Left ventricle: The cavity size was normal. Systolic function was   normal. The estimated ejection fraction was in the range of 50%   to 55%. Wall motion was normal; there were no regional wall   motion abnormalities. Features are consistent with a pseudonormal   left ventricular filling pattern, with concomitant abnormal   relaxation and increased filling pressure (grade 2 diastolic   dysfunction). - Aortic valve: Mildly calcified annulus. - Pulmonary arteries: Systolic pressure was mildly increased. PA   peak pressure: 34 mm Hg (S).  Assessment/Plan: 61 y.o.malewith Nephrotic syndrome ITP chronic kidney disease  on chronic prednisone therapy who is been admitted with methicillin sensitive staph coccus aureus bacteremia with sepsis and an epidural abscess status post ACDF and decompression on 2/15 with HW placement   Methicillin sensitive of staph aureus bacteremia and epidural abscess status post surgery with placement of plate: - continue on ceftriaxone 2gm Iv daily - if blood cx from 2/18 remain negative tomorrow, can have picc line placed - plan on starting rifampin 337m BID with meals - will do a total of 6 wk of IV therapy and likely switch to oral chronic suppression - will get baseline CMP tomorrow am to follow ast/alt - patient may need anti emetics if he does not tolerate rif - given recent surgery, unable to get TEE. Treatment course would be unchanged.  - diarrhea = appears related to laxative  We will arrange for follow visit in ID clinic and Iv abtx listed below  Diagnosis: mssa bacteremia and epidural abscess  Culture Result: MSSA  Allergies  Allergen Reactions  . Lipitor [Atorvastatin] Other (See Comments)    Heart races  . Penicillins Rash and Other (See Comments)    "I break out in between my fingers, rash, hives or whatever"    Discharge antibiotics: Per pharmacy protocol ceftriaxone 2gm iv daily  Duration: 6 wk, using 2/18 as day 1 [place] 1st  [place]  Labs weekly while on IV antibiotics: _x_ CBC with differential _x_ CMP _x_ CRP _x_ ESR  _x_ Please leave PIC in place until doctor has seen patient or been notified  Fax weekly labs to ([phone] Clinic Follow Up Appt: In 4-6 wk  @ [facility] with SBaxter Flatteryor [facility] [facility] Cell: [phone]: [phone]  11/21/2016, 4:50 PM

## 2016-11-21 NOTE — Care Management Note (Signed)
Case Management Note  Patient Details  Name: Thomas Nielsen MRN: [id number] Date of Birth: 1956-10-02  Subjective/Objective:           Pt presents with C6-7 Epidural Abscess s/p ACDF and evacuation.  CM will follow for discharge needs pending PT/OT evals and physician orders. Anticipate discharge home with IV antibiotics.    Action/Plan:   Expected Discharge Date:                  Expected Discharge Plan:  Home/Self Care  In-House Referral:     Discharge planning Services  CM Consult  Post Acute Care Choice:  Durable Medical Equipment Choice offered to:     DME Arranged:  Walker rolling DME Agency:  [place]:    [facility] Agency:     Status of Service:  In process, will continue to follow  If discussed at Long Length of Stay Meetings, dates discussed:    Additional Comments:  Rolm Baptise, RN 11/21/2016, 9:58 AM

## 2016-11-21 NOTE — Progress Notes (Signed)
[facility] Infusion Coordinator will follow pt with ID team for possible home IV ABX at DC.   If patient discharges after hours, please call [phone].   Larry Sierras 11/21/2016, 2:51 PM

## 2016-11-21 NOTE — Progress Notes (Signed)
Physical Therapy Treatment Patient Details Name: Thomas Nielsen MRN: [id number] DOB: 08/01/1956 Today's Date: 11/21/2016    History of Present Illness pt presents with C6-7 Epidural Abscess s/p ACDF and evacuation.  pt with hx of CKD, HTN, and Glomerulonephritis.      PT Comments    Continuing improvements with gait and activity tolerance; RW continues to be safest choice for amb   Follow Up Recommendations  Home health PT;Supervision - Intermittent     Equipment Recommendations  None recommended by PT    Recommendations for Other Services       Precautions / Restrictions Precautions Precautions: Fall;Cervical Precaution Comments: Pt able to recall 2/3 cervical precautions, re-educated Required Braces or Orthoses: Cervical Brace Cervical Brace: Hard collar;At all times ([place] collar for showering) Restrictions Weight Bearing Restrictions: No    Mobility  Bed Mobility               General bed mobility comments: Pt sitting OOB in recliner when OT entered the room  Transfers Overall transfer level: Needs assistance Equipment used: Rolling walker (2 wheeled) Transfers: Sit to/from Stand Sit to Stand: Supervision         General transfer comment: good hand placement this session  Ambulation/Gait Ambulation/Gait assistance: Supervision Ambulation Distance (Feet): 150 Feet Assistive device: Rolling walker (2 wheeled) (pushing IV Pole) Gait Pattern/deviations: Step-through pattern;Decreased stride length Gait velocity: decreased   General Gait Details: Cues to self-monitor for activity tolerance; half of walk with unilateral UE support from pushing IV pole; more steady with RW   Stairs            Wheelchair Mobility    Modified Rankin (Stroke Patients Only)       Balance Overall balance assessment: Needs assistance Sitting-balance support: No upper extremity supported;Feet supported Sitting balance-Leahy Scale: Good Sitting balance -  Comments: Able to sit on edge and don/doff socks with no LOB   Standing balance support: No upper extremity supported;During functional activity Standing balance-Leahy Scale: Good Standing balance comment: sink level grooming                    Cognition Arousal/Alertness: Awake/alert Behavior During Therapy: WFL for tasks assessed/performed Overall Cognitive Status: Within Functional Limits for tasks assessed                      Exercises      General Comments        Pertinent Vitals/Pain Pain Assessment: 0-10 Pain Score: 2  Pain Location: Neck, and BUE bicepts and elbows (states improves with movement) Pain Descriptors / Indicators: Sore Pain Intervention(s): Monitored during session    Home Living                      Prior Function            PT Goals (current goals can now be found in the care plan section) Acute Rehab PT Goals Patient Stated Goal: Back to work PT Goal Formulation: With patient Time For Goal Achievement: 11/25/16 Potential to Achieve Goals: Good Progress towards PT goals: Progressing toward goals    Frequency    Min 5X/week      PT Plan Current plan remains appropriate    Co-evaluation             End of Session Equipment Utilized During Treatment: Gait belt;Cervical collar Activity Tolerance: Patient tolerated treatment well Patient left: in chair;with call bell/phone within reach Nurse Communication:  Mobility status PT Visit Diagnosis: Unsteadiness on feet (R26.81)     Time: [id number] PT Time Calculation (min) (ACUTE ONLY): 22 min  Charges:  $Gait Training: 8-22 mins                    G Codes:       Colletta [place] 12/16/2016, 12:51 PM  Roney Marion, [place] Pager [phone] Office [phone]

## 2016-11-22 LAB — CULTURE, BLOOD (ROUTINE X 2)

## 2016-11-22 LAB — GLUCOSE, CAPILLARY
GLUCOSE-CAPILLARY: 122 mg/dL — AB (ref 65–99)
Glucose-Capillary: 120 mg/dL — ABNORMAL HIGH (ref 65–99)
Glucose-Capillary: 104 mg/dL — ABNORMAL HIGH (ref 65–99)
Glucose-Capillary: 111 mg/dL — ABNORMAL HIGH (ref 65–99)

## 2016-11-22 LAB — COMPREHENSIVE METABOLIC PANEL
ALBUMIN: 2 g/dL — AB (ref 3.5–5.0)
ALK PHOS: 38 U/L (ref 38–126)
ALT: 56 U/L (ref 17–63)
ANION GAP: 9 (ref 5–15)
AST: 23 U/L (ref 15–41)
BUN: 26 mg/dL — ABNORMAL HIGH (ref 6–20)
CO2: 24 mmol/L (ref 22–32)
Calcium: 8.4 mg/dL — ABNORMAL LOW (ref 8.9–10.3)
Chloride: 98 mmol/L — ABNORMAL LOW (ref 101–111)
Creatinine, Ser: 1.15 mg/dL (ref 0.61–1.24)
GFR calc Af Amer: >60 mL/min (ref >60)
GFR calc non Af Amer: >60 mL/min (ref >60)
GLUCOSE: 118 mg/dL — AB (ref 65–99)
POTASSIUM: 4 mmol/L (ref 3.5–5.1)
SODIUM: 131 mmol/L — AB (ref 135–145)
Total Bilirubin: 0.8 mg/dL (ref 0.3–1.2)
Total Protein: 5.3 g/dL — ABNORMAL LOW (ref 6.5–8.1)

## 2016-11-22 MED ORDER — SODIUM CHLORIDE 0.9% FLUSH: 10.0000 mL | INTRAVENOUS | Status: DC | PRN

## 2016-11-22 NOTE — Progress Notes (Signed)
Occupational Therapy Treatment Patient Details Name: Thomas Nielsen MRN: [id number] DOB: 1956-08-23 Today's Date: 11/22/2016    History of present illness pt presents with C6-7 Epidural Abscess s/p ACDF and evacuation.  pt with hx of CKD, HTN, and Glomerulonephritis.     OT comments  Pt progressing toward OT goals. He continues to require VC's to adhere to cervical precautions during ADL but this need is diminishing. He is able to complete standing grooming tasks as well as toilet transfer with supervision while using RW at this time. Also able to complete simulated tub transfer with tub bench with supervision and verbal cues for technique. Would continue to benefit from OT services while admitted to reinforce education concerning compensatory ADL strategies. D/C plan remains appropriate.   Follow Up Recommendations  No OT follow up;Supervision/Assistance - 24 hour    Equipment Recommendations  None recommended by OT    Recommendations for Other Services      Precautions / Restrictions Precautions Precautions: Fall;Cervical Precaution Comments: Reviewed cervical precautions verbally. Required Braces or Orthoses: Cervical Brace Cervical Brace: Hard collar;At all times Restrictions Weight Bearing Restrictions: No       Mobility Bed Mobility               General bed mobility comments: Pt sitting OOB in recliner when OT entered the room  Transfers Overall transfer level: Needs assistance Equipment used: Rolling walker (2 wheeled) Transfers: Sit to/from Stand Sit to Stand: Modified independent (Device/Increase time) Stand pivot transfers: Modified independent (Device/Increase time)       General transfer comment: Good technique to and from seated surface.      Balance Overall balance assessment: Needs assistance Sitting-balance support: No upper extremity supported;Feet supported Sitting balance-Leahy Scale: Normal Sitting balance - Comments: Able to sit on edge and  don/doff socks with no LOB   Standing balance support: No upper extremity supported;During functional activity Standing balance-Leahy Scale: Good Standing balance comment: Able to stand at sink to complete grooming tasks without UE support.                   ADL Overall ADL's : Needs assistance/impaired     Grooming: Supervision/safety;Standing Grooming Details (indicate cue type and reason): Verbal cues for cervical precautions         Upper Body Dressing : Minimal assistance;Sitting Upper Body Dressing Details (indicate cue type and reason): Min assist to don/doff cervical collar. Lower Body Dressing: Supervision/safety;Sit to/from stand   Toilet Transfer: Supervision/safety;Ambulation;Comfort height toilet;Grab bars;RW Toilet Transfer Details (indicate cue type and reason): Pt able to safely use RW. Toileting- Clothing Manipulation and Hygiene: Supervision/safety;Sit to/from stand   Tub/ Shower Transfer: Supervision/safety;Tub bench;Ambulation (RW)   Functional mobility during ADLs: Supervision/safety;Rolling walker General ADL Comments: Pt requiring VC's throughout to adhere to cervical precautions. Educated pt on donning/doffing cervical collar and he was able to complete with min assist.      Vision                     Perception     Praxis      Cognition   Behavior During Therapy: [facility] for tasks assessed/performed Overall Cognitive Status: Within Functional Limits for tasks assessed                  General Comments: Pt introspective during session due to concerns over his mother who is also admitted. Requires VC's to maintain precautions.      Exercises     Shoulder  Instructions       General Comments      Pertinent Vitals/ Pain       Pain Assessment: Faces Faces Pain Scale: Hurts a little bit Pain Location: Neck and B upper arms Pain Descriptors / Indicators: Sore Pain Intervention(s): Monitored during  session;Repositioned  Home Living                                          Prior Functioning/Environment              Frequency  Min 2X/week        Progress Toward Goals  OT Goals(current goals can now be found in the care plan section)  Progress towards OT goals: Progressing toward goals  Acute Rehab OT Goals Patient Stated Goal: Back to work OT Goal Formulation: With patient Time For Goal Achievement: 11/25/16 Potential to Achieve Goals: Good ADL Goals Pt Will Perform Grooming: with modified independence;standing Pt Will Perform Upper Body Bathing: with modified independence;sitting Pt Will Perform Lower Body Bathing: with modified independence;sit to/from stand Pt Will Perform Upper Body Dressing: with modified independence;sitting Pt Will Perform Lower Body Dressing: with modified independence;sit to/from stand Pt Will Transfer to Toilet: with modified independence;ambulating;regular height toilet;bedside commode;grab bars Pt Will Perform Toileting - Clothing Manipulation and hygiene: with modified independence;sit to/from stand Pt Will Perform Tub/Shower Transfer: Tub transfer;with modified independence;ambulating;tub bench;rolling walker  Plan Discharge plan remains appropriate    Co-evaluation                 End of Session Equipment Utilized During Treatment: Rolling walker  OT Visit Diagnosis: Other abnormalities of gait and mobility (R26.89);Muscle weakness (generalized) (M62.81)   Activity Tolerance Patient tolerated treatment well   Patient Left in chair;with call bell/phone within reach   Nurse Communication          Time: [id number] OT Time Calculation (min): 31 min  Charges: OT General Charges $OT Visit: 1 Procedure OT Treatments $Self Care/Home Management : 23-37 mins  Norman Herrlich, MS OTR/L  Pager: [place] 11/22/2016, 5:02 PM

## 2016-11-22 NOTE — Progress Notes (Signed)
Physical Therapy Treatment Patient Details Name: Thomas Nielsen MRN: [id number] DOB: 07/29/1956 Today's Date: 11/22/2016    History of Present Illness pt presents with C6-7 Epidural Abscess s/p ACDF and evacuation.  pt with hx of CKD, HTN, and Glomerulonephritis.      PT Comments    Pt progressing well and at this time all goals are met.  Informed supervising PT of need for change in recommendations and changing follow-up recommendations to NO follow up.  At this time we will plan for a PT to reassess patient for continued therapy services.     Follow Up Recommendations  No PT follow up     Equipment Recommendations  None recommended by PT    Recommendations for Other Services       Precautions / Restrictions Precautions Precautions: Fall;Cervical Precaution Comments: Reviewed cervical precautions verbally. Required Braces or Orthoses: Cervical Brace Cervical Brace: Hard collar;At all times Restrictions Weight Bearing Restrictions: No    Mobility  Bed Mobility               General bed mobility comments: Pt sitting OOB in recliner when OT entered the room  Transfers Overall transfer level: Needs assistance Equipment used: Rolling walker (2 wheeled) Transfers: Sit to/from Stand Sit to Stand: Modified independent (Device/Increase time) Stand pivot transfers: Modified independent (Device/Increase time)       General transfer comment: Good technique to and from seated surface.    Ambulation/Gait Ambulation/Gait assistance: Modified independent (Device/Increase time) Ambulation Distance (Feet): 220 Feet Assistive device: Rolling walker (2 wheeled) Gait Pattern/deviations: Step-through pattern;Decreased stride length Gait velocity: decreased   General Gait Details: Patient with good self monitoring of activty.  Good use of RW observed.     Stairs            Wheelchair Mobility    Modified Rankin (Stroke Patients Only)       Balance      Sitting balance-Leahy Scale: Normal Sitting balance - Comments: Able to sit on edge and don/doff socks with no LOB     Standing balance-Leahy Scale: Good                      Cognition Arousal/Alertness: Awake/alert Behavior During Therapy: WFL for tasks assessed/performed Overall Cognitive Status: Within Functional Limits for tasks assessed                 General Comments: Pt introspective during session due to concerns over his mother who is also admitted. Requires VC's to maintain precautions.    Exercises      General Comments        Pertinent Vitals/Pain Pain Assessment: Faces Faces Pain Scale: Hurts a little bit Pain Location: Neck and B upper arms Pain Descriptors / Indicators: Sore Pain Intervention(s): Monitored during session;Repositioned    Home Living                      Prior Function            PT Goals (current goals can now be found in the care plan section) Acute Rehab PT Goals Patient Stated Goal: Back to work Potential to Achieve Goals: Good Progress towards PT goals: PT to reassess next treatment (all goals met at this time, will informsupervising PT to assessif further PT needs are present.)    Frequency    Min 3X/week (reduced to 3x week based on progress at this time.  )  PT Plan Discharge plan needs to be updated    Co-evaluation             End of Session Equipment Utilized During Treatment: Gait belt;Cervical collar Activity Tolerance: Patient tolerated treatment well Patient left: in chair;with call bell/phone within reach Nurse Communication: Mobility status PT Visit Diagnosis: Unsteadiness on feet (R26.81)     Time: 8502-7741 PT Time Calculation (min) (ACUTE ONLY): 15 min  Charges:  $Gait Training: 8-22 mins                    G Codes:       Cristela Blue 2016/11/23, 3:56 PM Governor Rooks, PTA pager [phone]

## 2016-11-22 NOTE — Progress Notes (Signed)
Peripherally Inserted Central Catheter/Midline Placement  The IV Nurse has discussed with the patient and/or persons authorized to consent for the patient, the purpose of this procedure and the potential benefits and risks involved with this procedure.  The benefits include less needle sticks, lab draws from the catheter, and the patient may be discharged home with the catheter. Risks include, but not limited to, infection, bleeding, blood clot (thrombus formation), and puncture of an artery; nerve damage and irregular heartbeat and possibility to perform a PICC exchange if needed/ordered by physician.  Alternatives to this procedure were also discussed.  Bard Power PICC patient education guide, fact sheet on infection prevention and patient information card has been provided to patient /or left at bedside.    PICC/Midline Placement Documentation        Thomas Nielsen 11/22/2016, 2:16 PM Consent obtained by Fredrik Cove, RN

## 2016-11-22 NOTE — Progress Notes (Signed)
Patient ID: Aurther Loft, male   DOB: 05-Dec-1955, 61 y.o.   MRN: [id number] Subjective: Patient reports no pain or NTW  Objective: Vital signs in last 24 hours: Temp:  [98 F (36.7 C)-99.2 F (37.3 C)] 98.9 F (37.2 C) (02/20 0558) Pulse Rate:  [90-103] 92 (02/20 0558) Resp:  [18] 18 (02/20 0558) BP: (109-130)/(68-80) 115/77 (02/20 0558) SpO2:  [96 %-98 %] 97 % (02/20 0558)  Intake/Output from previous day: 02/19 0701 - 02/20 0700 In: 483 [P.O.:480; I.V.:3] Out: 4600 [Urine:4600] Intake/Output this shift: No intake/output data recorded.  Neurologic: Grossly normal  Lab Results: Lab Results  Component Value Date   WBC 10.9 (H) 11/21/2016   HGB 11.3 (L) 11/21/2016   HCT 31.9 (L) 11/21/2016   MCV 87.2 11/21/2016   PLT 264 11/21/2016   No results found for: INR, PROTIME BMET Lab Results  Component Value Date   NA 131 (L) 11/22/2016   K 4.0 11/22/2016   CL 98 (L) 11/22/2016   CO2 24 11/22/2016   GLUCOSE 118 (H) 11/22/2016   BUN 26 (H) 11/22/2016   CREATININE 1.15 11/22/2016   CALCIUM 8.4 (L) 11/22/2016    Studies/Results: Dg Chest Port 1 View  Result Date: 11/20/2016 CLINICAL DATA:  Fever EXAM: PORTABLE CHEST 1 VIEW COMPARISON:  11/17/2016 FINDINGS: Minimal atelectasis at the right base. No acute consolidation or effusion. Stable cardiomediastinal silhouette with atherosclerosis. No pneumothorax. Interim visualization of lower cervical hardware. IMPRESSION: Minimal atelectasis at the right base. No acute pulmonary infiltrate. Electronically Signed   By: Donavan Foil M.D.   On: 11/20/2016 19:29    Assessment/Plan: Doing well, in collar, on IV abx per ID, d/c foley   LOS: 5 days    JONES,DAVID S 11/22/2016, 8:19 AM

## 2016-11-22 NOTE — Care Management Note (Signed)
Case Management Note  Patient Details  Name: Thomas Nielsen MRN: [id number] Date of Birth: 23-Dec-1955  Subjective/Objective:                    Action/Plan: When medically ready the plan is for patient to d/c home with [facility] services and IV antibiotic therapy. CM met with the patient and provided him a list of [facility] agencies. He selected [place]. Pam with [facility] IV therapy notified and accepted the referral. Pt with orders for walker. Pt states he doesn't need one d/t having one at home. CM continuing to follow.    Expected Discharge Date:                  Expected Discharge Plan:  Home/Self Care  In-House Referral:     Discharge planning Services  CM Consult  Post Acute Care Choice:  Home Health Choice offered to:  Patient  DME Arranged:    DME Agency:  [place]:  RN [facility] Agency:  [place]  Status of Service:  In process, will continue to follow  If discussed at Long Length of Stay Meetings, dates discussed:    Additional Comments:  Pollie Friar, RN 11/22/2016, 12:06 PM

## 2016-11-23 LAB — AEROBIC/ANAEROBIC CULTURE W GRAM STAIN (SURGICAL/DEEP WOUND)

## 2016-11-23 LAB — GLUCOSE, CAPILLARY
Glucose-Capillary: 123 mg/dL — ABNORMAL HIGH (ref 65–99)
Glucose-Capillary: 132 mg/dL — ABNORMAL HIGH (ref 65–99)
Glucose-Capillary: 109 mg/dL — ABNORMAL HIGH (ref 65–99)

## 2016-11-23 LAB — AEROBIC/ANAEROBIC CULTURE (SURGICAL/DEEP WOUND)

## 2016-11-23 MED ORDER — RIFAMPIN 300 MG PO CAPS: 300.0000 mg | ORAL_CAPSULE | Freq: Two times a day (BID) | ORAL | 0 refills | Status: DC

## 2016-11-23 MED ORDER — OXYCODONE HCL 5 MG PO TABS: 5.0000 mg | ORAL_TABLET | Freq: Four times a day (QID) | ORAL | 0 refills | Status: DC | PRN

## 2016-11-23 MED ORDER — HEPARIN SOD (PORK) LOCK FLUSH 100 UNIT/ML IV SOLN
250.0000 [IU] | INTRAVENOUS | Status: AC | PRN
  Administered 2016-11-23: 250 [IU]

## 2016-11-23 MED ORDER — CEFTRIAXONE IV (FOR PTA / DISCHARGE USE ONLY): 2.0000 g | INTRAVENOUS | 0 refills | Status: DC

## 2016-11-23 MED ORDER — DEXTROSE 5 % IV SOLN: 2.0000 g | INTRAVENOUS | 2 refills | Status: DC

## 2016-11-23 NOTE — Care Management Note (Signed)
Case Management Note  Patient Details  Name: Thomas Nielsen MRN: [id number] Date of Birth: Oct 18, 1955  Subjective/Objective:                    Action/Plan: Patient discharging home today with [facility] for [facility] RN and IV antibiotic therapy. Pam and Santiago Glad with [facility] informed of d/c. Pt states he has transportation home.   Expected Discharge Date:  11/23/16               Expected Discharge Plan:  Home/Self Care  In-House Referral:     Discharge planning Services  CM Consult  Post Acute Care Choice:  Home Health Choice offered to:  Patient  DME Arranged:    DME Agency:  [place] Arranged:  RN [facility] Agency:  [place]  Status of Service:  Completed, signed off  If discussed at [place] of Stay Meetings, dates discussed:    Additional Comments:  Pollie Friar, RN 11/23/2016, 11:07 AM

## 2016-11-23 NOTE — Discharge Summary (Signed)
Physician Discharge Summary  Patient ID: Thomas Nielsen MRN: [id number] DOB/AGE: 63-Aug-1957 63 y.o.  Admit date: 11/17/2016 Discharge date: 11/23/2016  Admission Diagnoses: cervical epidural abscess   Discharge Diagnoses: same   Discharged Condition: stable  Hospital Course: The patient was admitted on 11/17/2016 and taken to the operating room where the patient underwent ACDF C6-7. The patient tolerated the procedure well and was taken to the recovery room and then to the ICU in stable condition. The hospital course was routine. He was transferred to the flor the next day. Pain much better, Cxs grew MSSA. ID was consulted. Pt started on rocephin and rifampin. PICC placed per ID. There were no complications. The wound remained clean dry and intact. Pt had appropriate neck soreness. No complaints of arm pain or new N/T/W. Strength improved greatly. The patient remained afebrile with stable vital signs, and tolerated a regular diet. The patient continued to increase activities, and pain was well controlled with oral pain medications.   Consults: ID  Significant Diagnostic Studies:  Results for orders placed or performed during the hospital encounter of 11/17/16  Blood Culture (routine x 2)  Result Value Ref Range   Specimen Description BLOOD LEFT FOREARM    Special Requests BOTTLES DRAWN AEROBIC AND ANAEROBIC 5CC    Culture  Setup Time      GRAM POSITIVE COCCI IN CLUSTERS IN BOTH AEROBIC AND ANAEROBIC BOTTLES CRITICAL RESULT CALLED TO, READ BACK BY AND VERIFIED WITH: GREG ABBOTT, PHARMD @0050  11/18/16 MKELLY,MLT Performed at [facility], [place] [address], [place], [place] [address]    Culture STAPHYLOCOCCUS AUREUS (A)    Report Status 11/20/2016 FINAL    Organism ID, Bacteria STAPHYLOCOCCUS AUREUS       Susceptibility   Staphylococcus aureus - MIC*    CIPROFLOXACIN <=0.5 SENSITIVE Sensitive     ERYTHROMYCIN >=8 RESISTANT Resistant     GENTAMICIN <=0.5 SENSITIVE Sensitive      OXACILLIN <=0.25 SENSITIVE Sensitive     TETRACYCLINE <=1 SENSITIVE Sensitive     VANCOMYCIN <=0.5 SENSITIVE Sensitive     TRIMETH/SULFA <=10 SENSITIVE Sensitive     CLINDAMYCIN <=0.25 SENSITIVE Sensitive     RIFAMPIN <=0.5 SENSITIVE Sensitive     Inducible Clindamycin NEGATIVE Sensitive     * STAPHYLOCOCCUS AUREUS  Blood Culture (routine x 2)  Result Value Ref Range   Specimen Description BLOOD RIGHT FOREARM    Special Requests BOTTLES DRAWN AEROBIC AND ANAEROBIC 5CC    Culture  Setup Time      GRAM POSITIVE COCCI IN CLUSTERS IN BOTH AEROBIC AND ANAEROBIC BOTTLES CRITICAL VALUE NOTED.  VALUE IS CONSISTENT WITH PREVIOUSLY REPORTED AND CALLED VALUE.    Culture (A)     STAPHYLOCOCCUS AUREUS SUSCEPTIBILITIES PERFORMED ON PREVIOUS CULTURE WITHIN THE LAST 5 DAYS. Performed at [facility], [place] [address], [place], [place] [address]    Report Status 11/20/2016 FINAL   Surgical pcr screen  Result Value Ref Range   MRSA, PCR NEGATIVE NEGATIVE   Staphylococcus aureus POSITIVE (A) NEGATIVE  Aerobic/Anaerobic Culture (surgical/deep wound)  Result Value Ref Range   Specimen Description ABSCESS CERVICAL EPIDURAL    Special Requests NONE    Gram Stain      FEW WBC PRESENT, PREDOMINANTLY PMN ABUNDANT GRAM POSITIVE COCCI IN CLUSTERS    Culture      ABUNDANT STAPHYLOCOCCUS AUREUS SUSCEPTIBILITIES PERFORMED ON PREVIOUS CULTURE WITHIN THE LAST 5 DAYS. NO ANAEROBES ISOLATED; CULTURE IN PROGRESS FOR 5 DAYS    Report  Status PENDING   Aerobic/Anaerobic Culture (surgical/deep wound)  Result Value Ref Range   Specimen Description ABSCESS SOFT TISSUE    Special Requests CERVICAL EPIDURAL ABSCESS    Gram Stain      FEW WBC PRESENT, PREDOMINANTLY PMN ABUNDANT GRAM POSITIVE COCCI IN PAIRS MODERATE GRAM POSITIVE COCCI IN CLUSTERS    Culture      ABUNDANT STAPHYLOCOCCUS AUREUS NO ANAEROBES ISOLATED; CULTURE IN PROGRESS FOR 5 DAYS    Report Status PENDING    Organism ID, Bacteria  STAPHYLOCOCCUS AUREUS       Susceptibility   Staphylococcus aureus - MIC*    CIPROFLOXACIN <=0.5 SENSITIVE Sensitive     ERYTHROMYCIN >=8 RESISTANT Resistant     GENTAMICIN <=0.5 SENSITIVE Sensitive     OXACILLIN <=0.25 SENSITIVE Sensitive     TETRACYCLINE <=1 SENSITIVE Sensitive     VANCOMYCIN <=0.5 SENSITIVE Sensitive     TRIMETH/SULFA <=10 SENSITIVE Sensitive     CLINDAMYCIN <=0.25 SENSITIVE Sensitive     RIFAMPIN <=0.5 SENSITIVE Sensitive     Inducible Clindamycin NEGATIVE Sensitive     * ABUNDANT STAPHYLOCOCCUS AUREUS  Blood Culture ID Panel (Reflexed)  Result Value Ref Range   Enterococcus species NOT DETECTED NOT DETECTED   Listeria monocytogenes NOT DETECTED NOT DETECTED   Staphylococcus species DETECTED (A) NOT DETECTED   Staphylococcus aureus DETECTED (A) NOT DETECTED   Methicillin resistance NOT DETECTED NOT DETECTED   Streptococcus species NOT DETECTED NOT DETECTED   Streptococcus agalactiae NOT DETECTED NOT DETECTED   Streptococcus pneumoniae NOT DETECTED NOT DETECTED   Streptococcus pyogenes NOT DETECTED NOT DETECTED   Acinetobacter baumannii NOT DETECTED NOT DETECTED   Enterobacteriaceae species NOT DETECTED NOT DETECTED   Enterobacter cloacae complex NOT DETECTED NOT DETECTED   Escherichia coli NOT DETECTED NOT DETECTED   Klebsiella oxytoca NOT DETECTED NOT DETECTED   Klebsiella pneumoniae NOT DETECTED NOT DETECTED   Proteus species NOT DETECTED NOT DETECTED   Serratia marcescens NOT DETECTED NOT DETECTED   Haemophilus influenzae NOT DETECTED NOT DETECTED   Neisseria meningitidis NOT DETECTED NOT DETECTED   Pseudomonas aeruginosa NOT DETECTED NOT DETECTED   Candida albicans NOT DETECTED NOT DETECTED   Candida glabrata NOT DETECTED NOT DETECTED   Candida krusei NOT DETECTED NOT DETECTED   Candida parapsilosis NOT DETECTED NOT DETECTED   Candida tropicalis NOT DETECTED NOT DETECTED  Culture, blood (Routine X 2) w Reflex to ID Panel  Result Value Ref Range    Specimen Description BLOOD LEFT ANTECUBITAL    Special Requests BOTTLES DRAWN AEROBIC AND ANAEROBIC 10CC EA    Culture  Setup Time      GRAM POSITIVE COCCI ANAEROBIC BOTTLE ONLY CRITICAL VALUE NOTED.  VALUE IS CONSISTENT WITH PREVIOUSLY REPORTED AND CALLED VALUE.    Culture (A)     STAPHYLOCOCCUS AUREUS SUSCEPTIBILITIES PERFORMED ON PREVIOUS CULTURE WITHIN THE LAST 5 DAYS.    Report Status 11/22/2016 FINAL   Culture, blood (Routine X 2) w Reflex to ID Panel  Result Value Ref Range   Specimen Description BLOOD LEFT ARM    Special Requests BOTTLES DRAWN AEROBIC AND ANAEROBIC 10CC EA    Culture  Setup Time      GRAM POSITIVE COCCI IN CLUSTERS IN BOTH AEROBIC AND ANAEROBIC BOTTLES CRITICAL RESULT CALLED TO, READ BACK BY AND VERIFIED WITH: T STONE,PHARMD AT 0943 11/20/16 BY L BENFIELD    Culture STAPHYLOCOCCUS AUREUS (A)    Report Status 11/22/2016 FINAL    Organism ID, Bacteria STAPHYLOCOCCUS AUREUS  Susceptibility   Staphylococcus aureus - MIC*    CIPROFLOXACIN <=0.5 SENSITIVE Sensitive     ERYTHROMYCIN >=8 RESISTANT Resistant     GENTAMICIN <=0.5 SENSITIVE Sensitive     OXACILLIN <=0.25 SENSITIVE Sensitive     TETRACYCLINE <=1 SENSITIVE Sensitive     VANCOMYCIN 1 SENSITIVE Sensitive     TRIMETH/SULFA <=10 SENSITIVE Sensitive     CLINDAMYCIN <=0.25 SENSITIVE Sensitive     RIFAMPIN <=0.5 SENSITIVE Sensitive     Inducible Clindamycin NEGATIVE Sensitive     * STAPHYLOCOCCUS AUREUS  Blood Culture ID Panel (Reflexed)  Result Value Ref Range   Enterococcus species NOT DETECTED NOT DETECTED   Listeria monocytogenes NOT DETECTED NOT DETECTED   Staphylococcus species DETECTED (A) NOT DETECTED   Staphylococcus aureus DETECTED (A) NOT DETECTED   Methicillin resistance NOT DETECTED NOT DETECTED   Streptococcus species NOT DETECTED NOT DETECTED   Streptococcus agalactiae NOT DETECTED NOT DETECTED   Streptococcus pneumoniae NOT DETECTED NOT DETECTED   Streptococcus pyogenes NOT  DETECTED NOT DETECTED   Acinetobacter baumannii NOT DETECTED NOT DETECTED   Enterobacteriaceae species NOT DETECTED NOT DETECTED   Enterobacter cloacae complex NOT DETECTED NOT DETECTED   Escherichia coli NOT DETECTED NOT DETECTED   Klebsiella oxytoca NOT DETECTED NOT DETECTED   Klebsiella pneumoniae NOT DETECTED NOT DETECTED   Proteus species NOT DETECTED NOT DETECTED   Serratia marcescens NOT DETECTED NOT DETECTED   Haemophilus influenzae NOT DETECTED NOT DETECTED   Neisseria meningitidis NOT DETECTED NOT DETECTED   Pseudomonas aeruginosa NOT DETECTED NOT DETECTED   Candida albicans NOT DETECTED NOT DETECTED   Candida glabrata NOT DETECTED NOT DETECTED   Candida krusei NOT DETECTED NOT DETECTED   Candida parapsilosis NOT DETECTED NOT DETECTED   Candida tropicalis NOT DETECTED NOT DETECTED  Culture, blood (Routine X 2) w Reflex to ID Panel  Result Value Ref Range   Specimen Description BLOOD LEFT ANTECUBITAL    Special Requests BOTTLES DRAWN AEROBIC AND ANAEROBIC 5CC EACH    Culture NO GROWTH 2 DAYS    Report Status PENDING   Culture, blood (Routine X 2) w Reflex to ID Panel  Result Value Ref Range   Specimen Description BLOOD RIGHT ANTECUBITAL    Special Requests BOTTLES DRAWN AEROBIC AND ANAEROBIC 5CC EACH    Culture NO GROWTH 2 DAYS    Report Status PENDING   Comprehensive metabolic panel  Result Value Ref Range   Sodium 131 (L) 135 - 145 mmol/L   Potassium 3.7 3.5 - 5.1 mmol/L   Chloride 96 (L) 101 - 111 mmol/L   CO2 24 22 - 32 mmol/L   Glucose, Bld 168 (H) 65 - 99 mg/dL   BUN 26 (H) 6 - 20 mg/dL   Creatinine, Ser 1.81 (H) 0.61 - 1.24 mg/dL   Calcium 8.4 (L) 8.9 - 10.3 mg/dL   Total Protein 6.9 6.5 - 8.1 g/dL   Albumin 3.3 (L) 3.5 - 5.0 g/dL   AST 18 15 - 41 U/L   ALT 13 (L) 17 - 63 U/L   Alkaline Phosphatase 37 (L) 38 - 126 U/L   Total Bilirubin 1.3 (H) 0.3 - 1.2 mg/dL   GFR calc non Af Amer 38 (L) >60 mL/min   GFR calc Af Amer 45 (L) >60 mL/min   Anion gap 11 5 -  15  CBC with Differential  Result Value Ref Range   WBC 11.9 (H) 4.0 - 10.5 K/uL   RBC 3.97 (L) 4.22 - 5.81 MIL/uL  Hemoglobin 12.2 (L) 13.0 - 17.0 g/dL   HCT 34.3 (L) 39.0 - 52.0 %   MCV 86.4 78.0 - 100.0 fL   MCH 30.7 26.0 - 34.0 pg   MCHC 35.6 30.0 - 36.0 g/dL   RDW 13.1 11.5 - 15.5 %   Platelets 126 (L) 150 - 400 K/uL   Neutrophils Relative % 91 %   Neutro Abs 10.9 (H) 1.7 - 7.7 K/uL   Lymphocytes Relative 4 %   Lymphs Abs 0.5 (L) 0.7 - 4.0 K/uL   Monocytes Relative 5 %   Monocytes Absolute 0.6 0.1 - 1.0 K/uL   Eosinophils Relative 0 %   Eosinophils Absolute 0.0 0.0 - 0.7 K/uL   Basophils Relative 0 %   Basophils Absolute 0.0 0.0 - 0.1 K/uL  HIV antibody  Result Value Ref Range   HIV Screen 4th Generation wRfx Non Reactive Non Reactive  Hepatitis c antibody (reflex)  Result Value Ref Range   HCV Ab <0.1 0.0 - 0.9 s/co ratio  Hepatitis B surface antigen  Result Value Ref Range   Hepatitis B Surface Ag Negative Negative  Sedimentation rate  Result Value Ref Range   Sed Rate 94 (H) 0 - 16 mm/hr  C-reactive protein  Result Value Ref Range   CRP 32.6 (H) <1.0 mg/dL  CBC  Result Value Ref Range   WBC 10.1 4.0 - 10.5 K/uL   RBC 3.47 (L) 4.22 - 5.81 MIL/uL   Hemoglobin 10.4 (L) 13.0 - 17.0 g/dL   HCT 30.6 (L) 39.0 - 52.0 %   MCV 88.2 78.0 - 100.0 fL   MCH 30.0 26.0 - 34.0 pg   MCHC 34.0 30.0 - 36.0 g/dL   RDW 13.8 11.5 - 15.5 %   Platelets 133 (L) 150 - 400 K/uL  Basic metabolic panel  Result Value Ref Range   Sodium 132 (L) 135 - 145 mmol/L   Potassium 4.1 3.5 - 5.1 mmol/L   Chloride 100 (L) 101 - 111 mmol/L   CO2 21 (L) 22 - 32 mmol/L   Glucose, Bld 206 (H) 65 - 99 mg/dL   BUN 25 (H) 6 - 20 mg/dL   Creatinine, Ser 1.36 (H) 0.61 - 1.24 mg/dL   Calcium 7.3 (L) 8.9 - 10.3 mg/dL   GFR calc non Af Amer 54 (L) >60 mL/min   GFR calc Af Amer >60 >60 mL/min   Anion gap 11 5 - 15  Magnesium  Result Value Ref Range   Magnesium 1.9 1.7 - 2.4 mg/dL  Phosphorus   Result Value Ref Range   Phosphorus 2.0 (L) 2.5 - 4.6 mg/dL  Glucose, capillary  Result Value Ref Range   Glucose-Capillary 193 (H) 65 - 99 mg/dL  Glucose, capillary  Result Value Ref Range   Glucose-Capillary 217 (H) 65 - 99 mg/dL  Glucose, capillary  Result Value Ref Range   Glucose-Capillary 234 (H) 65 - 99 mg/dL  Basic metabolic panel  Result Value Ref Range   Sodium 132 (L) 135 - 145 mmol/L   Potassium 4.2 3.5 - 5.1 mmol/L   Chloride 104 101 - 111 mmol/L   CO2 19 (L) 22 - 32 mmol/L   Glucose, Bld 144 (H) 65 - 99 mg/dL   BUN 31 (H) 6 - 20 mg/dL   Creatinine, Ser 1.15 0.61 - 1.24 mg/dL   Calcium 7.4 (L) 8.9 - 10.3 mg/dL   GFR calc non Af Amer >60 >60 mL/min   GFR calc Af Amer >60 >60 mL/min  Anion gap 9 5 - 15  CBC  Result Value Ref Range   WBC 10.3 4.0 - 10.5 K/uL   RBC 3.29 (L) 4.22 - 5.81 MIL/uL   Hemoglobin 9.8 (L) 13.0 - 17.0 g/dL   HCT 28.9 (L) 39.0 - 52.0 %   MCV 87.8 78.0 - 100.0 fL   MCH 29.8 26.0 - 34.0 pg   MCHC 33.9 30.0 - 36.0 g/dL   RDW 13.8 11.5 - 15.5 %   Platelets 164 150 - 400 K/uL  Troponin I (q 6hr x 3)  Result Value Ref Range   Troponin I 0.36 (HH) <0.03 ng/mL  Troponin I (q 6hr x 3)  Result Value Ref Range   Troponin I 0.30 (HH) <0.03 ng/mL  Troponin I (q 6hr x 3)  Result Value Ref Range   Troponin I 0.36 (HH) <0.03 ng/mL  Glucose, capillary  Result Value Ref Range   Glucose-Capillary 236 (H) 65 - 99 mg/dL  Glucose, capillary  Result Value Ref Range   Glucose-Capillary 152 (H) 65 - 99 mg/dL   Comment 1 Notify RN    Comment 2 Document in Chart   Glucose, capillary  Result Value Ref Range   Glucose-Capillary 136 (H) 65 - 99 mg/dL   Comment 1 Notify RN    Comment 2 Document in Chart   Glucose, capillary  Result Value Ref Range   Glucose-Capillary 153 (H) 65 - 99 mg/dL   Comment 1 Notify RN    Comment 2 Document in Chart   Glucose, capillary  Result Value Ref Range   Glucose-Capillary 144 (H) 65 - 99 mg/dL   Comment 1 Notify RN     Comment 2 Document in Chart   HCV Comment:  Result Value Ref Range   Comment: Comment   Glucose, capillary  Result Value Ref Range   Glucose-Capillary 135 (H) 65 - 99 mg/dL  Glucose, capillary  Result Value Ref Range   Glucose-Capillary 161 (H) 65 - 99 mg/dL   Comment 1 Notify RN    Comment 2 Document in Chart   Glucose, capillary  Result Value Ref Range   Glucose-Capillary 108 (H) 65 - 99 mg/dL   Comment 1 Notify RN    Comment 2 Document in Chart   Glucose, capillary  Result Value Ref Range   Glucose-Capillary 118 (H) 65 - 99 mg/dL   Comment 1 Notify RN    Comment 2 Document in Chart   Glucose, capillary  Result Value Ref Range   Glucose-Capillary 97 65 - 99 mg/dL  Glucose, capillary  Result Value Ref Range   Glucose-Capillary 114 (H) 65 - 99 mg/dL   Comment 1 Notify RN    Comment 2 Document in Chart   Glucose, capillary  Result Value Ref Range   Glucose-Capillary 136 (H) 65 - 99 mg/dL   Comment 1 Notify RN    Comment 2 Document in Chart   Urinalysis, Routine w reflex microscopic  Result Value Ref Range   Color, Urine STRAW (A) YELLOW   APPearance CLEAR CLEAR   Specific Gravity, Urine 1.006 1.005 - 1.030   pH 5.0 5.0 - 8.0   Glucose, UA NEGATIVE NEGATIVE mg/dL   Hgb urine dipstick MODERATE (A) NEGATIVE   Bilirubin Urine NEGATIVE NEGATIVE   Ketones, ur NEGATIVE NEGATIVE mg/dL   Protein, ur 30 (A) NEGATIVE mg/dL   Nitrite NEGATIVE NEGATIVE   Leukocytes, UA TRACE (A) NEGATIVE   RBC / HPF 0-5 0 - 5 RBC/hpf   WBC,  UA 0-5 0 - 5 WBC/hpf   Bacteria, UA RARE (A) NONE SEEN   Squamous Epithelial / LPF NONE SEEN NONE SEEN   Mucous PRESENT   Glucose, capillary  Result Value Ref Range   Glucose-Capillary 119 (H) 65 - 99 mg/dL   Comment 1 Notify RN    Comment 2 Document in Chart   Glucose, capillary  Result Value Ref Range   Glucose-Capillary 109 (H) 65 - 99 mg/dL   Comment 1 Notify RN    Comment 2 Document in Chart   Glucose, capillary  Result Value Ref Range    Glucose-Capillary 113 (H) 65 - 99 mg/dL   Comment 1 Notify RN    Comment 2 Document in Chart   CBC  Result Value Ref Range   WBC 10.9 (H) 4.0 - 10.5 K/uL   RBC 3.66 (L) 4.22 - 5.81 MIL/uL   Hemoglobin 11.3 (L) 13.0 - 17.0 g/dL   HCT 31.9 (L) 39.0 - 52.0 %   MCV 87.2 78.0 - 100.0 fL   MCH 30.9 26.0 - 34.0 pg   MCHC 35.4 30.0 - 36.0 g/dL   RDW 13.8 11.5 - 15.5 %   Platelets 264 150 - 400 K/uL  Basic metabolic panel  Result Value Ref Range   Sodium 130 (L) 135 - 145 mmol/L   Potassium 4.3 3.5 - 5.1 mmol/L   Chloride 96 (L) 101 - 111 mmol/L   CO2 23 22 - 32 mmol/L   Glucose, Bld 148 (H) 65 - 99 mg/dL   BUN 26 (H) 6 - 20 mg/dL   Creatinine, Ser 1.26 (H) 0.61 - 1.24 mg/dL   Calcium 8.6 (L) 8.9 - 10.3 mg/dL   GFR calc non Af Amer 59 (L) >60 mL/min   GFR calc Af Amer >60 >60 mL/min   Anion gap 11 5 - 15  Glucose, capillary  Result Value Ref Range   Glucose-Capillary 174 (H) 65 - 99 mg/dL  Comprehensive metabolic panel  Result Value Ref Range   Sodium 131 (L) 135 - 145 mmol/L   Potassium 4.0 3.5 - 5.1 mmol/L   Chloride 98 (L) 101 - 111 mmol/L   CO2 24 22 - 32 mmol/L   Glucose, Bld 118 (H) 65 - 99 mg/dL   BUN 26 (H) 6 - 20 mg/dL   Creatinine, Ser 1.15 0.61 - 1.24 mg/dL   Calcium 8.4 (L) 8.9 - 10.3 mg/dL   Total Protein 5.3 (L) 6.5 - 8.1 g/dL   Albumin 2.0 (L) 3.5 - 5.0 g/dL   AST 23 15 - 41 U/L   ALT 56 17 - 63 U/L   Alkaline Phosphatase 38 38 - 126 U/L   Total Bilirubin 0.8 0.3 - 1.2 mg/dL   GFR calc non Af Amer >60 >60 mL/min   GFR calc Af Amer >60 >60 mL/min   Anion gap 9 5 - 15  Glucose, capillary  Result Value Ref Range   Glucose-Capillary 141 (H) 65 - 99 mg/dL   Comment 1 Notify RN   Glucose, capillary  Result Value Ref Range   Glucose-Capillary 120 (H) 65 - 99 mg/dL  Glucose, capillary  Result Value Ref Range   Glucose-Capillary 104 (H) 65 - 99 mg/dL   Comment 1 Notify RN    Comment 2 Document in Chart   Glucose, capillary  Result Value Ref Range    Glucose-Capillary 122 (H) 65 - 99 mg/dL   Comment 1 Notify RN    Comment 2 Document in Chart  Glucose, capillary  Result Value Ref Range   Glucose-Capillary 111 (H) 65 - 99 mg/dL  Glucose, capillary  Result Value Ref Range   Glucose-Capillary 123 (H) 65 - 99 mg/dL   Comment 1 Notify RN    Comment 2 Document in Chart   Glucose, capillary  Result Value Ref Range   Glucose-Capillary 132 (H) 65 - 99 mg/dL  I-Stat CG4 Lactic Acid, ED  Result Value Ref Range   Lactic Acid, Venous 2.80 (HH) 0.5 - 1.9 mmol/L   Comment NOTIFIED PHYSICIAN   I-stat Chem 8, ED  Result Value Ref Range   Sodium 130 (L) 135 - 145 mmol/L   Potassium 3.8 3.5 - 5.1 mmol/L   Chloride 95 (L) 101 - 111 mmol/L   BUN 26 (H) 6 - 20 mg/dL   Creatinine, Ser 1.70 (H) 0.61 - 1.24 mg/dL   Glucose, Bld 170 (H) 65 - 99 mg/dL   Calcium, Ion 1.00 (L) 1.15 - 1.40 mmol/L   TCO2 24 0 - 100 mmol/L   Hemoglobin 12.6 (L) 13.0 - 17.0 g/dL   HCT 37.0 (L) 39.0 - 52.0 %  ECHOCARDIOGRAM COMPLETE  Result Value Ref Range   Weight 2,752 oz   Height 71 in   BP 131/84 mmHg  Type and screen [place]  Result Value Ref Range   ABO/RH(D) A POS    Antibody Screen NEG    Sample Expiration 11/20/2016   ABO/Rh  Result Value Ref Range   ABO/RH(D) A POS     Dg Cervical Spine 2-3 Views  Result Date: 11/17/2016 CLINICAL DATA:  Intraoperative imaging for C6-7 ACDF. EXAM: CERVICAL SPINE - 2-3 VIEW; DG C-ARM 61-120 MIN COMPARISON:  MRI cervical spine earlier today. FINDINGS: Two fluoroscopic intraoperative spot views of the cervical spine in the lateral projection demonstrate anterior plate and screws interbody spacer in place at C6-7. No acute finding. IMPRESSION: C6-7 ACDF. Electronically Signed   By: Inge Rise M.D.   On: 11/17/2016 16:32   Mr Cervical Spine W Or Wo Contrast  Result Date: 11/17/2016 CLINICAL DATA:  Neck pain, fever, and weakness.  Fall. EXAM: MRI CERVICAL SPINE WITHOUT AND WITH CONTRAST TECHNIQUE:  Multiplanar and multiecho pulse sequences of the cervical spine, to include the craniocervical junction and cervicothoracic junction, were obtained without and with intravenous contrast. CONTRAST:  7 mL MultiHance COMPARISON:  None. FINDINGS: The study is moderately to severely motion degraded throughout. Alignment: Trace retrolisthesis of C6 on C7. Vertebrae: Minimal endplate edema and enhancement at C6-7 with moderate to severe disc space narrowing. Only minimal STIR hyperintensity in the disc. Cord: Limited assessment due to motion artifact. Questionable T2 hyperintensity in the cord at the C7-T1 level on the sagittal T2 sequence. Posterior Fossa, vertebral arteries, paraspinal tissues: Diffuse prevertebral edema and enhancement throughout the cervical and visualized upper thoracic spine. Small volume prevertebral fluid, greatest at C4 with AP thickness of 5 mm. Mild edema in the posterior cervical paraspinal soft tissues including in the interspinous soft tissues in the mid and lower cervical spine. Disc levels: There is extensive circumferential epidural enhancement throughout the cervical spine and extending into the visualized upper thoracic spine. A nonenhancing T2 hyperintense ventral epidural collection at C6-7 measures approximately 2 cm in craniocaudal length and 6 mm in AP thickness and results in severe spinal stenosis with moderate to severe cord compression. IMPRESSION: 1. Extensive epidural enhancement throughout the cervical and visualized upper thoracic spine consistent with infectious phlegmon. 2. 2 cm ventral epidural abscess  at C6-7 with severe spinal stenosis and cord compression. 3. Mild endplate edema and enhancement at C6-7 which may be degenerative or reflect osteomyelitis. 4. Diffuse prevertebral edema/phlegmon and small volume fluid. Critical Value/emergent results were called by telephone at the time of interpretation on 11/17/2016 at 10:23 am to Dr. Quintella Reichert , who verbally  acknowledged these results. Electronically Signed   By: Logan Bores M.D.   On: 11/17/2016 10:27   Dg Chest Port 1 View  Result Date: 11/20/2016 CLINICAL DATA:  Fever EXAM: PORTABLE CHEST 1 VIEW COMPARISON:  11/17/2016 FINDINGS: Minimal atelectasis at the right base. No acute consolidation or effusion. Stable cardiomediastinal silhouette with atherosclerosis. No pneumothorax. Interim visualization of lower cervical hardware. IMPRESSION: Minimal atelectasis at the right base. No acute pulmonary infiltrate. Electronically Signed   By: Donavan Foil M.D.   On: 11/20/2016 19:29   Dg Chest Port 1 View  Result Date: 11/17/2016 CLINICAL DATA:  Increase weakness and fever over the past 3 days. The patient has sustained a fall today striking his head. History of ITP, chronic renal insufficiency, hypertension, former smoker. EXAM: PORTABLE CHEST 1 VIEW COMPARISON:  None in PACs FINDINGS: The lungs are well-expanded. The interstitial markings are coarse. There is no alveolar infiltrate. There is no pleural effusion. The heart and pulmonary vascularity are normal. There is calcification in the wall of the aortic arch and descending thoracic aorta. There is degenerative joint space loss of the right shoulder. The observed bony thorax is unremarkable. IMPRESSION: Chronic bronchitic -smoking related interstitial changes. No pneumonia nor CHF. Thoracic aortic atherosclerosis. Electronically Signed   By: David  Martinique M.D.   On: 11/17/2016 08:38   Dg C-arm 1-60 Min  Result Date: 11/17/2016 CLINICAL DATA:  Intraoperative imaging for C6-7 ACDF. EXAM: CERVICAL SPINE - 2-3 VIEW; DG C-ARM 61-120 MIN COMPARISON:  MRI cervical spine earlier today. FINDINGS: Two fluoroscopic intraoperative spot views of the cervical spine in the lateral projection demonstrate anterior plate and screws interbody spacer in place at C6-7. No acute finding. IMPRESSION: C6-7 ACDF. Electronically Signed   By: Inge Rise M.D.   On: 11/17/2016  16:32    Antibiotics:  Anti-infectives    Start     Dose/Rate Route Frequency Ordered Stop   11/23/16 0000  cefTRIAXone 2 g in dextrose 5 % 50 mL     2 g 100 mL/hr over 30 Minutes Intravenous Every 24 hours 11/23/16 1020     11/23/16 0000  rifampin (RIFADIN) 300 MG capsule     300 mg Oral 2 times daily with meals 11/23/16 1020     11/22/16 0800  rifampin (RIFADIN) capsule 300 mg     300 mg Oral 2 times daily with meals 11/21/16 1706     11/18/16 1100  cefTRIAXone (ROCEPHIN) 2 g in dextrose 5 % 50 mL IVPB     2 g 100 mL/hr over 30 Minutes Intravenous Every 24 hours 11/18/16 1018     11/18/16 0200  vancomycin (VANCOCIN) IVPB 750 mg/150 ml premix  Status:  Discontinued     750 mg 150 mL/hr over 60 Minutes Intravenous Every 12 hours 11/17/16 1736 11/18/16 1018   11/17/16 1830  aztreonam (AZACTAM) 2 g in dextrose 5 % 50 mL IVPB  Status:  Discontinued     2 g 100 mL/hr over 30 Minutes Intravenous Every 8 hours 11/17/16 1736 11/18/16 0816   11/17/16 1607  bacitracin 50,000 Units in sodium chloride irrigation 0.9 % 500 mL irrigation  Status:  Discontinued  As needed 11/17/16 1608 11/17/16 1629   11/17/16 1427  vancomycin (VANCOCIN) 1-5 GM/200ML-% IVPB    Comments:  Flowers, Rokoshi   : cabinet override      11/17/16 1427 11/18/16 0229      Discharge Exam: Blood pressure 106/67, pulse 94, temperature 98.7 F (37.1 C), temperature source Oral, resp. rate 17, height 5\' 11"  (1.803 m), weight 90.2 kg (198 lb 13.7 oz), SpO2 100 %. Neurologic: Grossly normal Dressing dry  Discharge Medications:   Allergies as of 11/23/2016      Reactions   Lipitor [atorvastatin] Other (See Comments)   Heart races   Penicillins Rash, Other (See Comments)   "I break out in between my fingers, rash, hives or whatever"      Medication List    TAKE these medications   calcium carbonate 600 MG Tabs tablet Commonly known as:  OS-CAL Take 600 mg by mouth daily.   cefTRIAXone 2 g in dextrose 5 % 50  mL Inject 2 g into the vein daily.   enalapril 10 MG tablet Commonly known as:  VASOTEC Take 10 mg by mouth daily.   oxyCODONE 5 MG immediate release tablet Commonly known as:  Oxy IR/ROXICODONE Take 1 tablet (5 mg total) by mouth every 6 (six) hours as needed for breakthrough pain.   predniSONE 5 MG tablet Commonly known as:  DELTASONE Take 1 tablet (5 mg total) by mouth daily.   rifampin 300 MG capsule Commonly known as:  RIFADIN Take 1 capsule (300 mg total) by mouth 2 (two) times daily with a meal.            Durable Medical Equipment        Start     Ordered   11/17/16 1743  DME Walker rolling  Once    Question:  Patient needs a walker to treat with the following condition  Answer:  Epidural abscess   11/17/16 1742      Disposition: home with [facility] for IV abx per ID   Final Dx: ACDF C6-7 for epidural abscess  Discharge Instructions    Call MD for:  difficulty breathing, headache or visual disturbances    Complete by:  As directed    Call MD for:  persistant nausea and vomiting    Complete by:  As directed    Call MD for:  redness, tenderness, or signs of infection (pain, swelling, redness, odor or green/yellow discharge around incision site)    Complete by:  As directed    Call MD for:  severe uncontrolled pain    Complete by:  As directed    Call MD for:  temperature >100.4    Complete by:  As directed    Diet - low sodium heart healthy    Complete by:  As directed    Discharge instructions    Complete by:  As directed    No driving or lifting, wear collar   Increase activity slowly    Complete by:  As directed       Follow-up Information    JONES,DAVID S, MD. Schedule an appointment as soon as possible for a visit in 2 week(s).   Specialty:  Neurosurgery Contact information: [address] [place] [address] [phone]            Signed: Eustace Moore 11/23/2016, 10:21 AM

## 2016-11-23 NOTE — Progress Notes (Signed)
PHARMACY CONSULT NOTE FOR:  OUTPATIENT  PARENTERAL ANTIBIOTIC THERAPY (OPAT)  Indication: MSSA bacteremia and epidural abscess Regimen: ceftriaxone 2g IV daily End date: 01/01/17     Thank you for allowing pharmacy to be a part of this patient's care.  Gwenlyn Perking, PharmD PGY1 Pharmacy Resident Pager: [phone] 11/23/2016 11:32 AM

## 2016-11-23 NOTE — Progress Notes (Signed)
D/c instructions reviewed with patient and family. No further questions at this time.

## 2016-11-25 ENCOUNTER — Inpatient Hospital Stay (HOSPITAL_COMMUNITY)
Admission: EM | Admit: 2016-11-25 | Discharge: 2016-12-01 | DRG: 856 | Disposition: A | Discharge status: Home Health | Payer: BLUE CROSS/BLUE SHIELD | Attending: Neurological Surgery | Admitting: Neurological Surgery

## 2016-11-25 ENCOUNTER — Encounter (HOSPITAL_COMMUNITY): Payer: Self-pay | Admitting: Emergency Medicine

## 2016-11-25 DIAGNOSIS — R339 Retention of urine, unspecified: Secondary | ICD-10-CM | POA: Diagnosis not present

## 2016-11-25 DIAGNOSIS — Z8042 Family history of malignant neoplasm of prostate: Secondary | ICD-10-CM

## 2016-11-25 DIAGNOSIS — T8149XA Infection following a procedure, other surgical site, initial encounter: Secondary | ICD-10-CM

## 2016-11-25 DIAGNOSIS — Z888 Allergy status to other drugs, medicaments and biological substances status: Secondary | ICD-10-CM

## 2016-11-25 DIAGNOSIS — I129 Hypertensive chronic kidney disease with stage 1 through stage 4 chronic kidney disease, or unspecified chronic kidney disease: Secondary | ICD-10-CM | POA: Diagnosis present

## 2016-11-25 DIAGNOSIS — Z87891 Personal history of nicotine dependence: Secondary | ICD-10-CM

## 2016-11-25 DIAGNOSIS — E785 Hyperlipidemia, unspecified: Secondary | ICD-10-CM | POA: Diagnosis present

## 2016-11-25 DIAGNOSIS — Z880 Allergy status to penicillin: Secondary | ICD-10-CM

## 2016-11-25 DIAGNOSIS — N138 Other obstructive and reflux uropathy: Secondary | ICD-10-CM | POA: Diagnosis present

## 2016-11-25 DIAGNOSIS — D649 Anemia, unspecified: Secondary | ICD-10-CM | POA: Diagnosis present

## 2016-11-25 DIAGNOSIS — N401 Enlarged prostate with lower urinary tract symptoms: Secondary | ICD-10-CM | POA: Diagnosis present

## 2016-11-25 DIAGNOSIS — T814XXA Infection following a procedure, initial encounter: Secondary | ICD-10-CM | POA: Diagnosis not present

## 2016-11-25 DIAGNOSIS — Z7952 Long term (current) use of systemic steroids: Secondary | ICD-10-CM

## 2016-11-25 DIAGNOSIS — N183 Chronic kidney disease, stage 3 (moderate): Secondary | ICD-10-CM | POA: Diagnosis present

## 2016-11-25 DIAGNOSIS — A4101 Sepsis due to Methicillin susceptible Staphylococcus aureus: Secondary | ICD-10-CM | POA: Diagnosis present

## 2016-11-25 DIAGNOSIS — G062 Extradural and subdural abscess, unspecified: Secondary | ICD-10-CM

## 2016-11-25 DIAGNOSIS — G061 Intraspinal abscess and granuloma: Secondary | ICD-10-CM | POA: Diagnosis present

## 2016-11-25 DIAGNOSIS — T8140XA Infection following a procedure, unspecified, initial encounter: Secondary | ICD-10-CM

## 2016-11-25 DIAGNOSIS — Z87441 Personal history of nephrotic syndrome: Secondary | ICD-10-CM

## 2016-11-25 DIAGNOSIS — D693 Immune thrombocytopenic purpura: Secondary | ICD-10-CM | POA: Diagnosis present

## 2016-11-25 LAB — CULTURE, BLOOD (ROUTINE X 2)
Culture: NO GROWTH
Culture: NO GROWTH

## 2016-11-25 LAB — URINALYSIS, ROUTINE W REFLEX MICROSCOPIC
BILIRUBIN URINE: NEGATIVE
GLUCOSE, UA: NEGATIVE mg/dL
HGB URINE DIPSTICK: NEGATIVE
Ketones, ur: NEGATIVE mg/dL
Leukocytes, UA: NEGATIVE
Nitrite: NEGATIVE
PH: 5 (ref 5.0–8.0)
Protein, ur: NEGATIVE mg/dL
Specific Gravity, Urine: 1.011 (ref 1.005–1.030)

## 2016-11-25 NOTE — ED Provider Notes (Signed)
[facility] DEPT Provider Note   CSN: [id number] Arrival date & time: 11/25/16  2214  By signing my name below, I, Dolores Hoose, attest that this documentation has been prepared under the direction and in the presence of Ripley Fraise, MD . Electronically Signed: Dolores Hoose, Scribe. 11/25/2016. 11:05 PM.  History   Chief Complaint Chief Complaint  Patient presents with  . Urinary Retention   The history is provided by the patient. No language interpreter was used.   HPI Comments:  Thomas Nielsen is a 61 y.o. male with pmhx of CKD who presents to the Emergency Department complaining of constant, unchanged difficulty urinating onset 8 days ago. He further describes his symptoms as an inability to pass urine unless he strains and bears down. Pt reports associated urinary frequency. He states that he had neck surgery (ACD) 8 days ago and had a foley catheter placed for the surgery, which is when his symptoms began. Pt denies any fevers, vomiting, back pain, weakness, bowel incontinence, constipation or abdominal pain. Pt's last urination was briefly before contact with the provider here in the ED. He notes that he passed one full specimen collection cup of urine but that within the hour he expects he will feel the need to urinate again.  He is also requesting evaluation of his neck wound.    Past Medical History:  Diagnosis Date  . Anemia, unspecified 09/06/2013  . CKD (chronic kidney disease)    on chronic low dose Prednisone  . HTN (hypertension)   . Hyperlipidemia   . ITP (idiopathic thrombocytopenic purpura) 2011   on chronic low dose Prednisone  . 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 Surgery, elective   . Staphylococcus aureus bacteremia with sepsis ([place])   . Nephrotic syndrome   . 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  . Anemia, unspecified 09/06/2013  . Membranous glomerulonephritis  01/27/2012  . HTN (hypertension)   . Hyperlipidemia   . ITP (idiopathic thrombocytopenic purpura)   . Stage 3 chronic kidney disease     Past Surgical History:  Procedure Laterality Date  . ANTERIOR CERVICAL DECOMPRESSION FOR EPIDURAL ABSCESS N/A 11/17/2016   Procedure: ANTERIOR CERVICAL DECOMPRESSION FOR EPIDURAL ABSCESS CERVICAL SIX-SEVEN;  Surgeon: Eustace Moore, MD;  Location: [facility];  Service: Neurosurgery;  Laterality: N/A;  . HERNIA REPAIR    . kidney biospy         Home Medications    Prior to Admission medications   Medication Sig Start Date End Date Taking? Authorizing Provider  calcium carbonate (OS-CAL) 600 MG TABS Take 600 mg by mouth daily.    Historical Provider, MD  cefTRIAXone (ROCEPHIN) IVPB Inject 2 g into the vein daily. Indication:  MSSA bacteremia and epidural abscess Last Day of Therapy: 01/01/17 Labs - Once weekly:  CBC/D and BMP, Labs - Every other week:  ESR and CRP 11/23/16 01/01/17  Eustace Moore, MD  cefTRIAXone 2 g in dextrose 5 % 50 mL Inject 2 g into the vein daily. 11/23/16   Eustace Moore, MD  enalapril (VASOTEC) 10 MG tablet Take 10 mg by mouth daily.  12/02/11   Historical Provider, MD  oxyCODONE (OXY IR/ROXICODONE) 5 MG immediate release tablet Take 1 tablet (5 mg total) by mouth every 6 (six) hours as needed for breakthrough pain. 11/23/16   Eustace Moore, MD  predniSONE (DELTASONE) 5 MG tablet Take 1 tablet (5 mg total)  by mouth daily. 01/31/12   Exie Parody, MD  rifampin (RIFADIN) 300 MG capsule Take 1 capsule (300 mg total) by mouth 2 (two) times daily with a meal. 11/23/16   Tia Alert, MD    Family History Family History  Problem Relation Age of Onset  . Cancer Father     prostate ca    Social History Social History  Substance Use Topics  . Smoking status: Former Smoker    Packs/day: 1.00    Years: 30.00    Quit date: 09/06/2010  . Smokeless tobacco: Never Used  . Alcohol use No     Allergies   Lipitor [atorvastatin] and  Penicillins   Review of Systems Review of Systems  Constitutional: Negative for fever.  Gastrointestinal: Negative for abdominal pain, constipation and vomiting.       Negative for Bowel Incontinence  Genitourinary: Positive for difficulty urinating and frequency.  Musculoskeletal: Negative for back pain.  Neurological: Negative for weakness.  All other systems reviewed and are negative.    Physical Exam Updated Vital Signs BP 132/83 (BP Location: Left Arm)   Pulse 102   Temp 98.9 F (37.2 C) (Oral)   Resp 16   Ht 5\' 11"  (1.803 m)   Wt 172 lb (78 kg)   SpO2 98%   BMI 23.99 kg/m   Physical Exam CONSTITUTIONAL: Well developed/well nourished HEAD: Normocephalic/atraumatic EYES: EOMI ENMT: Mucous membranes moist NECK: aspen collar in place, no tenderness to wound, copious drainage from wound (see photo) SPINE/BACK:entire spine nontender CV: S1/S2 noted, no murmurs/rubs/gallops noted LUNGS: Lungs are clear to auscultation bilaterally, no apparent distress ABDOMEN: soft, nontender GU:no cva tenderness, no scrotal tenderness, no hernia, chaperone present NEURO: Pt is awake/alert/appropriate, moves all extremitiesx4.  No facial droop.   EXTREMITIES: pulses normal/equal, full ROM, PICC line in right arm. SKIN: warm, color normal PSYCH: no abnormalities of mood noted, alert and oriented to situation      Patient gave verbal permission to utilize photo for medical documentation only The image was not stored on any personal device   ED Treatments / Results  DIAGNOSTIC STUDIES:  Oxygen Saturation is 98% on RA, normal by my interpretation.    COORDINATION OF CARE:  11:18 PM Discussed treatment plan with pt at bedside and pt agreed to plan.  Labs (all labs ordered are listed, but only abnormal results are displayed) Labs Reviewed  URINE CULTURE  CULTURE, BLOOD (ROUTINE X 2)  CULTURE, BLOOD (ROUTINE X 2)  URINALYSIS, ROUTINE W REFLEX MICROSCOPIC  CBC WITH  DIFFERENTIAL/PLATELET  BASIC METABOLIC PANEL    EKG  EKG Interpretation None       Radiology No results found.  Procedures Procedures (including critical care time)  Medications Ordered in ED Medications - No data to display   Initial Impression / Assessment and Plan / ED Course  I have reviewed the triage vital signs and the nursing notes.  Pertinent labs  results that were available during my care of the patient were reviewed by me and considered in my medical decision making (see chart for details).     11:43 PM Pt is s/p ACD on 2/15 for epidural abscess currently on IV/oral ABX for MSSA, now here with drainage from wound, concern for post op infection.   D/w dr 3/15, he will see patient Pt is also requesting foley catheter for postoperative urinary retention Labs pending 12:22 AM D/w neurosurgery on call PA, patient will be admitted  Final Clinical Impressions(s) / ED Diagnoses  Final diagnoses:  Urinary retention  Postoperative infection, initial encounter    New Prescriptions New Prescriptions   No medications on file  I personally performed the services described in this documentation, which was scribed in my presence. The recorded information has been reviewed and is accurate.        Ripley Fraise, MD 11/26/16 [phone]

## 2016-11-25 NOTE — ED Triage Notes (Signed)
Per EMS.  Pt has surgery on his neck over the weekend and had a foley placed.  The foley was removed Tuesday and since then the pt has not been able to void more that a "little dribble" every hour.  Pt has had to have 2 in and out caths since Tuesday.

## 2016-11-26 ENCOUNTER — Inpatient Hospital Stay (HOSPITAL_COMMUNITY): Payer: BLUE CROSS/BLUE SHIELD

## 2016-11-26 ENCOUNTER — Encounter (HOSPITAL_COMMUNITY)
Admission: EM | Disposition: A | Discharge status: Home Health | Payer: Self-pay | Source: Home / Self Care | Attending: Neurological Surgery

## 2016-11-26 ENCOUNTER — Inpatient Hospital Stay (HOSPITAL_COMMUNITY): Payer: BLUE CROSS/BLUE SHIELD | Admitting: Certified Registered Nurse Anesthetist

## 2016-11-26 ENCOUNTER — Encounter (HOSPITAL_COMMUNITY): Payer: Self-pay | Admitting: Radiology

## 2016-11-26 DIAGNOSIS — R339 Retention of urine, unspecified: Secondary | ICD-10-CM | POA: Diagnosis present

## 2016-11-26 DIAGNOSIS — E785 Hyperlipidemia, unspecified: Secondary | ICD-10-CM | POA: Diagnosis present

## 2016-11-26 DIAGNOSIS — D693 Immune thrombocytopenic purpura: Secondary | ICD-10-CM | POA: Diagnosis present

## 2016-11-26 DIAGNOSIS — Z888 Allergy status to other drugs, medicaments and biological substances status: Secondary | ICD-10-CM | POA: Diagnosis not present

## 2016-11-26 DIAGNOSIS — D649 Anemia, unspecified: Secondary | ICD-10-CM | POA: Diagnosis present

## 2016-11-26 DIAGNOSIS — Z87441 Personal history of nephrotic syndrome: Secondary | ICD-10-CM | POA: Diagnosis not present

## 2016-11-26 DIAGNOSIS — B9561 Methicillin susceptible Staphylococcus aureus infection as the cause of diseases classified elsewhere: Secondary | ICD-10-CM | POA: Diagnosis not present

## 2016-11-26 DIAGNOSIS — Z87891 Personal history of nicotine dependence: Secondary | ICD-10-CM

## 2016-11-26 DIAGNOSIS — G061 Intraspinal abscess and granuloma: Secondary | ICD-10-CM

## 2016-11-26 DIAGNOSIS — T814XXD Infection following a procedure, subsequent encounter: Secondary | ICD-10-CM | POA: Diagnosis not present

## 2016-11-26 DIAGNOSIS — Z880 Allergy status to penicillin: Secondary | ICD-10-CM

## 2016-11-26 DIAGNOSIS — I340 Nonrheumatic mitral (valve) insufficiency: Secondary | ICD-10-CM | POA: Diagnosis not present

## 2016-11-26 DIAGNOSIS — N401 Enlarged prostate with lower urinary tract symptoms: Secondary | ICD-10-CM | POA: Diagnosis present

## 2016-11-26 DIAGNOSIS — Z978 Presence of other specified devices: Secondary | ICD-10-CM | POA: Diagnosis not present

## 2016-11-26 DIAGNOSIS — N183 Chronic kidney disease, stage 3 (moderate): Secondary | ICD-10-CM | POA: Diagnosis present

## 2016-11-26 DIAGNOSIS — T814XXA Infection following a procedure, initial encounter: Secondary | ICD-10-CM | POA: Diagnosis present

## 2016-11-26 DIAGNOSIS — Y838 Other surgical procedures as the cause of abnormal reaction of the patient, or of later complication, without mention of misadventure at the time of the procedure: Secondary | ICD-10-CM

## 2016-11-26 DIAGNOSIS — Z8042 Family history of malignant neoplasm of prostate: Secondary | ICD-10-CM

## 2016-11-26 DIAGNOSIS — N138 Other obstructive and reflux uropathy: Secondary | ICD-10-CM | POA: Diagnosis present

## 2016-11-26 DIAGNOSIS — R0902 Hypoxemia: Secondary | ICD-10-CM | POA: Diagnosis not present

## 2016-11-26 DIAGNOSIS — A4101 Sepsis due to Methicillin susceptible Staphylococcus aureus: Secondary | ICD-10-CM | POA: Diagnosis present

## 2016-11-26 DIAGNOSIS — Z7952 Long term (current) use of systemic steroids: Secondary | ICD-10-CM | POA: Diagnosis not present

## 2016-11-26 DIAGNOSIS — I129 Hypertensive chronic kidney disease with stage 1 through stage 4 chronic kidney disease, or unspecified chronic kidney disease: Secondary | ICD-10-CM | POA: Diagnosis present

## 2016-11-26 HISTORY — PX: CERVICAL WOUND DEBRIDEMENT: SHX6694

## 2016-11-26 LAB — CBC WITH DIFFERENTIAL/PLATELET
Basophils Absolute: 0 10*3/uL (ref 0.0–0.1)
Basophils Relative: 0 %
Eosinophils Absolute: 0.1 10*3/uL (ref 0.0–0.7)
Eosinophils Relative: 1 %
HCT: 32 % — ABNORMAL LOW (ref 39.0–52.0)
Hemoglobin: 11.2 g/dL — ABNORMAL LOW (ref 13.0–17.0)
Lymphocytes Relative: 9 %
Lymphs Abs: 1 10*3/uL (ref 0.7–4.0)
MCH: 31.2 pg (ref 26.0–34.0)
MCHC: 35 g/dL (ref 30.0–36.0)
MCV: 89.1 fL (ref 78.0–100.0)
Monocytes Absolute: 0.9 10*3/uL (ref 0.1–1.0)
Monocytes Relative: 8 %
Neutro Abs: 9.2 10*3/uL — ABNORMAL HIGH (ref 1.7–7.7)
Neutrophils Relative %: 82 %
Platelets: 469 10*3/uL — ABNORMAL HIGH (ref 150–400)
RBC: 3.59 MIL/uL — ABNORMAL LOW (ref 4.22–5.81)
RDW: 14 % (ref 11.5–15.5)
WBC: 11.2 10*3/uL — ABNORMAL HIGH (ref 4.0–10.5)

## 2016-11-26 LAB — BASIC METABOLIC PANEL
Anion gap: 13 (ref 5–15)
BUN: 27 mg/dL — ABNORMAL HIGH (ref 6–20)
CO2: 21 mmol/L — ABNORMAL LOW (ref 22–32)
Calcium: 9.5 mg/dL (ref 8.9–10.3)
Chloride: 101 mmol/L (ref 101–111)
Creatinine, Ser: 1.09 mg/dL (ref 0.61–1.24)
GFR calc Af Amer: >60 mL/min (ref >60)
GFR calc non Af Amer: >60 mL/min (ref >60)
Glucose, Bld: 114 mg/dL — ABNORMAL HIGH (ref 65–99)
Potassium: 4.4 mmol/L (ref 3.5–5.1)
Sodium: 135 mmol/L (ref 135–145)

## 2016-11-26 LAB — TYPE AND SCREEN
ABO/RH(D): A POS
Antibody Screen: NEGATIVE

## 2016-11-26 SURGERY — CERVICAL WOUND DEBRIDEMENT
Anesthesia: General | Site: Spine Cervical

## 2016-11-26 MED ORDER — OXYCODONE-ACETAMINOPHEN 5-325 MG PO TABS
1.0000 | ORAL_TABLET | ORAL | Status: DC | PRN
  Administered 2016-11-26: 2 via ORAL
  Administered 2016-11-27: 1 via ORAL
  Administered 2016-11-27: 2 via ORAL
  Filled 2016-11-26 (×2): qty 2
  Filled 2016-11-26: qty 1

## 2016-11-26 MED ORDER — SODIUM CHLORIDE 0.9% FLUSH: 3.0000 mL | INTRAVENOUS | Status: DC | PRN

## 2016-11-26 MED ORDER — RIFAMPIN 300 MG PO CAPS
300.0000 mg | ORAL_CAPSULE | Freq: Two times a day (BID) | ORAL | Status: DC
  Administered 2016-11-26 – 2016-11-28 (×5): 300 mg via ORAL
  Filled 2016-11-26 (×6): qty 1

## 2016-11-26 MED ORDER — SODIUM CHLORIDE 0.9 % IR SOLN
Status: DC | PRN
  Administered 2016-11-26: 11:00:00

## 2016-11-26 MED ORDER — FENTANYL CITRATE (PF) 100 MCG/2ML IJ SOLN
INTRAMUSCULAR | Status: AC
  Filled 2016-11-26: qty 2

## 2016-11-26 MED ORDER — POLYETHYLENE GLYCOL 3350 17 G PO PACK: 17.0000 g | PACK | Freq: Every day | ORAL | Status: DC | PRN

## 2016-11-26 MED ORDER — FENTANYL CITRATE (PF) 100 MCG/2ML IJ SOLN
INTRAMUSCULAR | Status: DC | PRN
Start: 2016-11-26 — End: 2016-11-26
  Administered 2016-11-26 (×4): 50 ug via INTRAVENOUS

## 2016-11-26 MED ORDER — SODIUM CHLORIDE 0.9 % IV SOLN: 250.0000 mL | INTRAVENOUS | Status: DC | PRN

## 2016-11-26 MED ORDER — SODIUM CHLORIDE 0.9 % IV SOLN: INTRAVENOUS | Status: DC

## 2016-11-26 MED ORDER — PHENYLEPHRINE HCL 10 MG/ML IJ SOLN
INTRAVENOUS | Status: DC | PRN
  Administered 2016-11-26: 50 ug/min via INTRAVENOUS

## 2016-11-26 MED ORDER — OXYCODONE HCL 5 MG PO TABS
5.0000 mg | ORAL_TABLET | Freq: Four times a day (QID) | ORAL | Status: DC | PRN
  Administered 2016-11-26: 5 mg via ORAL
  Filled 2016-11-26: qty 1

## 2016-11-26 MED ORDER — PROPOFOL 10 MG/ML IV BOLUS
INTRAVENOUS | Status: AC
  Filled 2016-11-26: qty 20

## 2016-11-26 MED ORDER — METHOCARBAMOL 750 MG PO TABS
750.0000 mg | ORAL_TABLET | Freq: Four times a day (QID) | ORAL | Status: DC
  Administered 2016-11-26 – 2016-11-28 (×10): 750 mg via ORAL
  Filled 2016-11-26 (×11): qty 1

## 2016-11-26 MED ORDER — MAGNESIUM CITRATE PO SOLN: 1.0000 | Freq: Once | ORAL | Status: DC | PRN

## 2016-11-26 MED ORDER — VANCOMYCIN HCL 1000 MG IV SOLR
INTRAVENOUS | Status: AC
  Filled 2016-11-26: qty 1000

## 2016-11-26 MED ORDER — CALCIUM CARBONATE 1250 (500 CA) MG PO TABS
1250.0000 mg | ORAL_TABLET | Freq: Every day | ORAL | Status: DC
  Administered 2016-11-26 – 2016-11-28 (×3): 1250 mg via ORAL
  Filled 2016-11-26 (×4): qty 1

## 2016-11-26 MED ORDER — PREDNISONE 5 MG PO TABS
5.0000 mg | ORAL_TABLET | Freq: Every day | ORAL | Status: DC
  Administered 2016-11-27 – 2016-11-28 (×2): 5 mg via ORAL
  Filled 2016-11-26 (×3): qty 1

## 2016-11-26 MED ORDER — SENNA 8.6 MG PO TABS: 1.0000 | ORAL_TABLET | Freq: Two times a day (BID) | ORAL | Status: DC

## 2016-11-26 MED ORDER — VANCOMYCIN HCL IN DEXTROSE 1-5 GM/200ML-% IV SOLN
INTRAVENOUS | Status: AC
  Filled 2016-11-26: qty 200

## 2016-11-26 MED ORDER — IOPAMIDOL (ISOVUE-300) INJECTION 61%
INTRAVENOUS | Status: AC
  Administered 2016-11-26: 75 mL
  Filled 2016-11-26: qty 75

## 2016-11-26 MED ORDER — CEFTRIAXONE SODIUM-DEXTROSE 2-2.22 GM-% IV SOLR
2.0000 g | INTRAVENOUS | Status: DC
  Filled 2016-11-26: qty 50

## 2016-11-26 MED ORDER — BISACODYL 10 MG RE SUPP: 10.0000 mg | Freq: Every day | RECTAL | Status: DC | PRN

## 2016-11-26 MED ORDER — MIDAZOLAM HCL 2 MG/2ML IJ SOLN
INTRAMUSCULAR | Status: AC
  Filled 2016-11-26: qty 2

## 2016-11-26 MED ORDER — ONDANSETRON HCL 4 MG/2ML IJ SOLN: 4.0000 mg | Freq: Four times a day (QID) | INTRAMUSCULAR | Status: DC | PRN

## 2016-11-26 MED ORDER — CELECOXIB 200 MG PO CAPS
200.0000 mg | ORAL_CAPSULE | Freq: Two times a day (BID) | ORAL | Status: DC
  Administered 2016-11-26 – 2016-11-28 (×5): 200 mg via ORAL
  Filled 2016-11-26 (×6): qty 1

## 2016-11-26 MED ORDER — OXYCODONE HCL 5 MG PO TABS: 5.0000 mg | ORAL_TABLET | Freq: Once | ORAL | Status: DC | PRN

## 2016-11-26 MED ORDER — THROMBIN 5000 UNITS EX SOLR
CUTANEOUS | Status: AC
  Filled 2016-11-26: qty 5000

## 2016-11-26 MED ORDER — MIDAZOLAM HCL 5 MG/5ML IJ SOLN
INTRAMUSCULAR | Status: DC | PRN
  Administered 2016-11-26: 2 mg via INTRAVENOUS

## 2016-11-26 MED ORDER — DEXTROSE 5 % IV SOLN
2.0000 g | Freq: Two times a day (BID) | INTRAVENOUS | Status: DC
  Administered 2016-11-26 (×2): 2 g via INTRAVENOUS
  Filled 2016-11-26 (×3): qty 2

## 2016-11-26 MED ORDER — ZOLPIDEM TARTRATE 5 MG PO TABS: 5.0000 mg | ORAL_TABLET | Freq: Every evening | ORAL | Status: DC | PRN

## 2016-11-26 MED ORDER — THROMBIN 5000 UNITS EX SOLR
OROMUCOSAL | Status: DC | PRN
  Administered 2016-11-26: 11:00:00 via TOPICAL

## 2016-11-26 MED ORDER — ACETAMINOPHEN 325 MG PO TABS: 650.0000 mg | ORAL_TABLET | Freq: Four times a day (QID) | ORAL | Status: DC | PRN

## 2016-11-26 MED ORDER — FENTANYL CITRATE (PF) 100 MCG/2ML IJ SOLN: 25.0000 ug | INTRAMUSCULAR | Status: DC | PRN

## 2016-11-26 MED ORDER — 0.9 % SODIUM CHLORIDE (POUR BTL) OPTIME
TOPICAL | Status: DC | PRN
  Administered 2016-11-26: 1000 mL

## 2016-11-26 MED ORDER — SUGAMMADEX SODIUM 200 MG/2ML IV SOLN
INTRAVENOUS | Status: DC | PRN
  Administered 2016-11-26: 200 mg via INTRAVENOUS

## 2016-11-26 MED ORDER — ENALAPRIL MALEATE 5 MG PO TABS
10.0000 mg | ORAL_TABLET | Freq: Every day | ORAL | Status: DC
  Administered 2016-11-27 – 2016-11-28 (×2): 10 mg via ORAL
  Filled 2016-11-26 (×2): qty 2
  Filled 2016-11-26: qty 1
  Filled 2016-11-26 (×2): qty 2

## 2016-11-26 MED ORDER — SODIUM CHLORIDE 0.9% FLUSH
10.0000 mL | INTRAVENOUS | Status: DC | PRN
  Administered 2016-11-26: 10 mL
  Filled 2016-11-26: qty 40

## 2016-11-26 MED ORDER — PROPOFOL 10 MG/ML IV BOLUS
INTRAVENOUS | Status: DC | PRN
  Administered 2016-11-26: 120 mg via INTRAVENOUS

## 2016-11-26 MED ORDER — HYDROCODONE-ACETAMINOPHEN 5-325 MG PO TABS: 1.0000 | ORAL_TABLET | ORAL | Status: DC | PRN

## 2016-11-26 MED ORDER — ONDANSETRON HCL 4 MG PO TABS: 4.0000 mg | ORAL_TABLET | Freq: Four times a day (QID) | ORAL | Status: DC | PRN

## 2016-11-26 MED ORDER — SODIUM CHLORIDE 0.9% FLUSH: 3.0000 mL | Freq: Two times a day (BID) | INTRAVENOUS | Status: DC

## 2016-11-26 MED ORDER — VANCOMYCIN HCL 1000 MG IV SOLR
INTRAVENOUS | Status: DC | PRN
  Administered 2016-11-26: 1000 mg via INTRAVENOUS

## 2016-11-26 MED ORDER — PHENYLEPHRINE HCL 10 MG/ML IJ SOLN
INTRAMUSCULAR | Status: DC | PRN
  Administered 2016-11-26 (×3): 80 ug via INTRAVENOUS

## 2016-11-26 MED ORDER — ONDANSETRON HCL 4 MG/2ML IJ SOLN
INTRAMUSCULAR | Status: DC | PRN
  Administered 2016-11-26: 4 mg via INTRAVENOUS

## 2016-11-26 MED ORDER — LACTATED RINGERS IV SOLN
INTRAVENOUS | Status: DC
  Administered 2016-11-26 – 2016-11-29 (×4): via INTRAVENOUS

## 2016-11-26 MED ORDER — VANCOMYCIN HCL 1000 MG IV SOLR
INTRAVENOUS | Status: DC | PRN
Start: 2016-11-26 — End: 2016-11-26
  Administered 2016-11-26: 1000 mg via TOPICAL

## 2016-11-26 MED ORDER — ROCURONIUM BROMIDE 100 MG/10ML IV SOLN
INTRAVENOUS | Status: DC | PRN
  Administered 2016-11-26: 50 mg via INTRAVENOUS

## 2016-11-26 MED ORDER — OXYCODONE HCL 5 MG/5ML PO SOLN: 5.0000 mg | Freq: Once | ORAL | Status: DC | PRN

## 2016-11-26 MED ORDER — OXYCODONE HCL 5 MG PO TABS
5.0000 mg | ORAL_TABLET | ORAL | Status: DC | PRN
  Administered 2016-11-28: 5 mg via ORAL
  Filled 2016-11-26: qty 1

## 2016-11-26 MED ORDER — ACETAMINOPHEN 650 MG RE SUPP: 650.0000 mg | Freq: Four times a day (QID) | RECTAL | Status: DC | PRN

## 2016-11-26 SURGICAL SUPPLY — 72 items
ADH SKN CLS APL DERMABOND .7 (GAUZE/BANDAGES/DRESSINGS)
APL SKNCLS STERI-STRIP NONHPOA (GAUZE/BANDAGES/DRESSINGS) ×1
BAG DECANTER FOR FLEXI CONT (MISCELLANEOUS) ×3 IMPLANT
BENZOIN TINCTURE PRP APPL 2/3 (GAUZE/BANDAGES/DRESSINGS) ×2 IMPLANT
BIT DRILL NEURO 2X3.1 SFT TUCH (MISCELLANEOUS) ×1 IMPLANT
BLADE CLIPPER SURG (BLADE) ×2 IMPLANT
BLADE SURG 11 STRL SS (BLADE) ×1 IMPLANT
BUR ROUND FLUTED 5 RND (BURR) ×1 IMPLANT
BUR ROUND FLUTED 5MM RND (BURR)
CANISTER SUCT 3000ML PPV (MISCELLANEOUS) ×6 IMPLANT
CARTRIDGE OIL MAESTRO DRILL (MISCELLANEOUS) ×1 IMPLANT
CHLORAPREP W/TINT 26ML (MISCELLANEOUS) ×3 IMPLANT
CLOSURE WOUND 1/2 X4 (GAUZE/BANDAGES/DRESSINGS) ×1
CONT SPEC 4OZ CLIKSEAL STRL BL (MISCELLANEOUS) ×3 IMPLANT
DECANTER SPIKE VIAL GLASS SM (MISCELLANEOUS) ×3 IMPLANT
DERMABOND ADVANCED (GAUZE/BANDAGES/DRESSINGS)
DERMABOND ADVANCED .7 DNX12 (GAUZE/BANDAGES/DRESSINGS) ×1 IMPLANT
DIFFUSER DRILL AIR PNEUMATIC (MISCELLANEOUS) ×1 IMPLANT
DRAPE MICROSCOPE LEICA (MISCELLANEOUS) ×1 IMPLANT
DRAPE POUCH INSTRU U-SHP 10X18 (DRAPES) ×3 IMPLANT
DRAPE SURG 17X23 STRL (DRAPES) ×1 IMPLANT
DRILL NEURO 2X3.1 SOFT TOUCH (MISCELLANEOUS)
DRSG OPSITE 4X5.5 SM (GAUZE/BANDAGES/DRESSINGS) ×2 IMPLANT
DRSG TELFA 3X8 NADH (GAUZE/BANDAGES/DRESSINGS) ×3 IMPLANT
ELECT REM PT RETURN 9FT ADLT (ELECTROSURGICAL) ×3
ELECTRODE REM PT RTRN 9FT ADLT (ELECTROSURGICAL) ×1 IMPLANT
EVACUATOR SILICONE 100CC (DRAIN) ×2 IMPLANT
GAUZE SPONGE 4X4 12PLY STRL (GAUZE/BANDAGES/DRESSINGS) IMPLANT
GAUZE SPONGE 4X4 16PLY XRAY LF (GAUZE/BANDAGES/DRESSINGS) IMPLANT
GLOVE BIOGEL PI IND STRL 7.5 (GLOVE) ×1 IMPLANT
GLOVE BIOGEL PI INDICATOR 7.5 (GLOVE) ×4
GLOVE SS BIOGEL STRL SZ 7.5 (GLOVE) ×2 IMPLANT
GLOVE SUPERSENSE BIOGEL SZ 7.5 (GLOVE) ×4
GOWN STRL REUS W/ TWL LRG LVL3 (GOWN DISPOSABLE) ×1 IMPLANT
GOWN STRL REUS W/ TWL XL LVL3 (GOWN DISPOSABLE) IMPLANT
GOWN STRL REUS W/TWL LRG LVL3 (GOWN DISPOSABLE) ×9
GOWN STRL REUS W/TWL XL LVL3 (GOWN DISPOSABLE)
HEMOSTAT POWDER KIT SURGIFOAM (HEMOSTASIS) ×3 IMPLANT
KIT BASIN OR (CUSTOM PROCEDURE TRAY) ×3 IMPLANT
KIT ROOM TURNOVER OR (KITS) ×3 IMPLANT
NDL HYPO 18GX1.5 BLUNT FILL (NEEDLE) ×1 IMPLANT
NDL HYPO 21X1.5 SAFETY (NEEDLE) ×2 IMPLANT
NDL HYPO 25X1 1.5 SAFETY (NEEDLE) ×1 IMPLANT
NEEDLE HYPO 18GX1.5 BLUNT FILL (NEEDLE) ×3 IMPLANT
NEEDLE HYPO 21X1.5 SAFETY (NEEDLE) ×6 IMPLANT
NEEDLE HYPO 25X1 1.5 SAFETY (NEEDLE) ×3 IMPLANT
NS IRRIG 1000ML POUR BTL (IV SOLUTION) ×3 IMPLANT
OIL CARTRIDGE MAESTRO DRILL (MISCELLANEOUS) ×3
PACK LAMINECTOMY NEURO (CUSTOM PROCEDURE TRAY) ×3 IMPLANT
PACK UNIVERSAL I (CUSTOM PROCEDURE TRAY) ×3 IMPLANT
PAD ARMBOARD 7.5X6 YLW CONV (MISCELLANEOUS) ×9 IMPLANT
PAD DRESSING TELFA 3X8 NADH (GAUZE/BANDAGES/DRESSINGS) IMPLANT
PATTIES SURGICAL .5X1.5 (GAUZE/BANDAGES/DRESSINGS) ×3 IMPLANT
RUBBERBAND STERILE (MISCELLANEOUS) ×6 IMPLANT
SPONGE NEURO XRAY DETECT 1X3 (DISPOSABLE) ×3 IMPLANT
SPONGE SURGIFOAM ABS GEL SZ50 (HEMOSTASIS) ×3 IMPLANT
STRIP CLOSURE SKIN 1/2X4 (GAUZE/BANDAGES/DRESSINGS) ×1 IMPLANT
SUT STRATAFIX MNCRL+ 3-0 PS-2 (SUTURE)
SUT STRATAFIX MONOCRYL 3-0 (SUTURE)
SUT VIC AB 0 CT1 18XCR BRD8 (SUTURE) ×1 IMPLANT
SUT VIC AB 0 CT1 8-18 (SUTURE) ×3
SUT VIC AB 2-0 CT1 18 (SUTURE) ×3 IMPLANT
SUT VIC AB 3-0 SH 18 (SUTURE) ×4 IMPLANT
SUT VIC AB 4-0 PS2 27 (SUTURE) IMPLANT
SUTURE STRATFX MNCRL+ 3-0 PS-2 (SUTURE) IMPLANT
SYR 30ML LL (SYRINGE) ×6 IMPLANT
SYR 5ML LL (SYRINGE) ×3 IMPLANT
TOWEL OR 17X24 6PK STRL BLUE (TOWEL DISPOSABLE) ×3 IMPLANT
TOWEL OR 17X26 10 PK STRL BLUE (TOWEL DISPOSABLE) ×3 IMPLANT
TUBE CONNECTING 12'X1/4 (SUCTIONS) ×1
TUBE CONNECTING 12X1/4 (SUCTIONS) ×2 IMPLANT
WATER STERILE IRR 1000ML POUR (IV SOLUTION) ×3 IMPLANT

## 2016-11-26 NOTE — Progress Notes (Signed)
Patient up to chair with one assist for approximately 1.5 hours. No complaints of pain. Continue to monitor.

## 2016-11-26 NOTE — Progress Notes (Signed)
Pharmacy Antibiotic Note Thomas Nielsen is a 61 y.o. male admitted on 11/25/2016 with right lateral neck infection. S/p I&D on 2/24. Recent admission for MSSA bacteremia and cervical spinal epidural abscess s/p ACDF and subsequent discharge home on IV Ceftriaxone and Rifampin PO. Pharmacy has been consulted for Cefepime dosing.  Plan: 1. Cefepime 2 gram IV every 12 hours   Height: 5\' 11"  (180.3 cm) Weight: 172 lb (78 kg) IBW/kg (Calculated) : 75.3  Temp (24hrs), Avg:98.6 F (37 C), Min:97.9 F (36.6 C), Max:98.9 F (37.2 C)   Recent Labs Lab 11/21/16 1511 11/22/16 0501 11/26/16 0022  WBC 10.9*  --  11.2*  CREATININE 1.26* 1.15 1.09    Estimated Creatinine Clearance: 74.8 mL/min (by C-G formula based on SCr of 1.09 mg/dL).    Allergies  Allergen Reactions  . Lipitor [Atorvastatin] Other (See Comments)    Heart races  . Penicillins Rash and Other (See Comments)    "I break out in between my fingers, rash, hives or whatever"    Antimicrobials this admission: 2/24 Cefepime >> 2/24 Rifampin >>  CTX PTA   Microbiology results: 2/24: BCx: GPC in pairs in 4/4 2/18 BCx: ngF 2/16 BCx: MSSA 2/15 BCx: MSSA 2/15: Cervical epidural abscess: MSSA  Thank you for allowing pharmacy to be a part of this patient's care.  Vincenza Hews, PharmD, BCPS 11/26/2016, 2:54 PM

## 2016-11-26 NOTE — Progress Notes (Signed)
Pt received onto unit from surgery via bed. Report taken from PACU nurse.

## 2016-11-26 NOTE — Anesthesia Postprocedure Evaluation (Addendum)
Anesthesia Post Note  Patient: Thomas Nielsen  Procedure(s) Performed: Procedure(s) (LRB): CERVICAL WOUND EXPLORATION, IRRIGATION DEBRIDEMENT (N/A)  Patient location during evaluation: PACU Anesthesia Type: General Level of consciousness: awake and alert Pain management: pain level controlled Vital Signs Assessment: post-procedure vital signs reviewed and stable Respiratory status: spontaneous breathing, nonlabored ventilation, respiratory function stable and patient connected to nasal cannula oxygen Cardiovascular status: blood pressure returned to baseline and stable Postop Assessment: no signs of nausea or vomiting Anesthetic complications: no       Last Vitals:  Vitals:   11/26/16 1239 11/26/16 1715  BP: 116/79 128/87  Pulse: 81 98  Resp: 14 16  Temp: 36.7 C 36.6 C    Last Pain:  Vitals:   11/26/16 1715  TempSrc: Oral  PainSc:                  Tyshell Ramberg

## 2016-11-26 NOTE — Progress Notes (Signed)
Patient returned from CT

## 2016-11-26 NOTE — Consult Note (Signed)
[facility]       Reason for Consult:post operative wound infection   Referring Physician: Dr. Cyndy Freeze  Principal Problem:   Abscess in epidural space of cervical spine   . calcium carbonate  1,250 mg Oral Q breakfast  . ceFEPime (MAXIPIME) IV  2 g Intravenous Q12H  . enalapril  10 mg Oral Daily  . predniSONE  5 mg Oral Daily  . rifampin  300 mg Oral BID WC    Recommendations: I will broaden to cefepime for now pending cultures  Will check ESR and CRP Will follow cultures  Assessment: He has a wound infection following recent cervical spinal epidural abscess s/p ACDF and evacuation done 2/15 and blood cultures and wound with MSSA.  He has been on ceftriaxone and rifampin and now with wound infection.   Cultures with gram positive cocci in pairs.   Bacteremia - treatment as above; last blood cultures negative  Antibiotics: Ceftriaxone and rifampin  HPI: Thomas Nielsen is a 61 y.o. male with a history of nephrotic syndrome and CKD with ITP who intially presented earlier this month with pain in cervical area and noted C6-7 epidural abscess which required debridement as above on 2/15.  Cultures in blood and wound as above and he came in now with urinary retention and an incidentally noted drainage from his wound.  He was taken to the or today by Dr. Cyndy Freeze and abscess drained.  Pus was noted down to the spine and drain placed.  No recent fever, no chills.  Has been tolerating antibiotics.   CT neck independently reviewed and extensive edema noted.    Review of Systems:  Constitutional: negative for fevers, chills, malaise and anorexia Gastrointestinal: negative for diarrhea Integument/breast: negative for rash Musculoskeletal: negative for myalgias and arthralgias All other systems reviewed and are negative    Past Medical History:  Diagnosis Date  . Anemia, unspecified 09/06/2013  . CKD (chronic kidney disease)    on chronic low dose Prednisone  .  HTN (hypertension)   . Hyperlipidemia   . ITP (idiopathic thrombocytopenic purpura) 2011   on chronic low dose Prednisone  . 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 Smoking status: Former Smoker    Packs/day: 1.00    Years: 30.00    Quit date: 09/06/2010  . Smokeless tobacco: Never Used  . Alcohol use No    Family History  Problem Relation Age of Onset  . Cancer Father     prostate ca    Allergies  Allergen Reactions  . Lipitor [Atorvastatin] Other (See Comments)    Heart races  . Penicillins Rash and Other (See Comments)    "I break out in between my fingers, rash, hives or whatever"    Physical Exam: Constitutional: in no apparent distress and alert  Vitals:   11/26/16 1202 11/26/16 1239  BP: 115/80 116/79  Pulse: 83 81  Resp: 12 14  Temp:  98.1 F (36.7 C)   EYES: anicteric ENMT: anterior drain with serosanguinous drainage Cardiovascular: Cor RRR Respiratory: CTA B; normal respiratory effort GI: Bowel sounds are normal, liver is not enlarged, spleen is not enlarged Musculoskeletal: no pedal edema noted Skin: negatives: no rash  Lab Results  Component Value Date   WBC 11.2 (H) 11/26/2016   HGB 11.2 (L) 11/26/2016   HCT 32.0 (L) 11/26/2016   MCV 89.1 11/26/2016   PLT 469 (H) 11/26/2016    Lab Results  Component Value Date   CREATININE 1.09 11/26/2016   BUN 27 (H) 11/26/2016   NA 135 11/26/2016   K 4.4 11/26/2016   CL 101 11/26/2016   CO2 21 (L) 11/26/2016    Lab Results  Component Value Date   ALT 56 11/22/2016   AST 23 11/22/2016   ALKPHOS 38 11/22/2016     Microbiology: Recent Results (from the past 240 hour(s))  Blood Culture (routine x 2)     Status: Abnormal   Collection Time: 11/17/16  8:10 AM  Result Value Ref Range Status   Specimen Description BLOOD LEFT FOREARM  Final   Special Requests BOTTLES DRAWN AEROBIC AND ANAEROBIC 5CC  Final   Culture  Setup Time   Final    GRAM POSITIVE COCCI  IN CLUSTERS IN BOTH AEROBIC AND ANAEROBIC BOTTLES CRITICAL RESULT CALLED TO, READ BACK BY AND VERIFIED WITH: GREG ABBOTT, PHARMD _0  11/18/16 MKELLY,MLT Performed at [facility], [place] [address] [place], [place] [address]    Culture STAPHYLOCOCCUS AUREUS (A)  Final   Report Status 11/20/2016 FINAL  Final   Organism ID, Bacteria STAPHYLOCOCCUS AUREUS  Final      Susceptibility   Staphylococcus aureus - MIC*    CIPROFLOXACIN <=0.5 SENSITIVE Sensitive     ERYTHROMYCIN >=8 RESISTANT Resistant     GENTAMICIN <=0.5 SENSITIVE Sensitive     OXACILLIN <=0.25 SENSITIVE Sensitive     TETRACYCLINE <=1 SENSITIVE Sensitive     VANCOMYCIN <=0.5 SENSITIVE Sensitive     TRIMETH/SULFA <=10 SENSITIVE Sensitive     CLINDAMYCIN <=0.25 SENSITIVE Sensitive     RIFAMPIN <=0.5 SENSITIVE Sensitive     Inducible Clindamycin NEGATIVE Sensitive     * STAPHYLOCOCCUS AUREUS  Blood Culture ID Panel (Reflexed)     Status: Abnormal   Collection Time: 11/17/16  8:10 AM  Result Value Ref Range Status   Enterococcus species NOT DETECTED NOT DETECTED Final   Listeria monocytogenes NOT DETECTED NOT DETECTED Final   Staphylococcus species DETECTED (A) NOT DETECTED Final    Comment: CRITICAL RESULT CALLED TO, READ BACK BY AND VERIFIED WITH: GREG ABBOTT, PHARMD _1  11/18/16 MKELLY,MLT    Staphylococcus aureus DETECTED (A) NOT DETECTED Final    Comment: Methicillin (oxacillin) susceptible Staphylococcus aureus (MSSA). Preferred therapy is anti staphylococcal beta lactam antibiotic (Cefazolin or Nafcillin), unless clinically contraindicated. CRITICAL RESULT CALLED TO, READ BACK BY AND VERIFIED WITH: GREG ABBOTT, PHARMD _2  02/16/1/8 MKELLY,MLT    Methicillin resistance NOT DETECTED NOT DETECTED Final   Streptococcus species NOT DETECTED NOT DETECTED Final   Streptococcus agalactiae NOT DETECTED NOT DETECTED Final   Streptococcus pneumoniae NOT DETECTED NOT DETECTED Final   Streptococcus pyogenes NOT DETECTED  NOT DETECTED Final   Acinetobacter baumannii NOT DETECTED NOT DETECTED Final   Enterobacteriaceae species NOT DETECTED NOT DETECTED Final   Enterobacter cloacae complex NOT DETECTED NOT DETECTED Final   Escherichia coli NOT DETECTED NOT DETECTED Final   Klebsiella oxytoca NOT DETECTED NOT DETECTED Final   Klebsiella pneumoniae NOT DETECTED NOT DETECTED Final   Proteus species NOT DETECTED NOT DETECTED Final   Serratia marcescens NOT DETECTED NOT DETECTED Final   Haemophilus influenzae NOT DETECTED NOT DETECTED Final   Neisseria meningitidis NOT DETECTED NOT DETECTED Final   Pseudomonas aeruginosa NOT DETECTED NOT DETECTED Final   Candida albicans NOT DETECTED NOT DETECTED Final   Candida glabrata NOT DETECTED NOT DETECTED Final   Candida krusei NOT DETECTED NOT DETECTED Final   Candida parapsilosis NOT DETECTED NOT DETECTED  Final   Candida tropicalis NOT DETECTED NOT DETECTED Final    Comment: Performed at [facility], [place] [address], [place], [place] [address]  Blood Culture (routine x 2)     Status: Abnormal   Collection Time: 11/17/16  8:45 AM  Result Value Ref Range Status   Specimen Description BLOOD RIGHT FOREARM  Final   Special Requests BOTTLES DRAWN AEROBIC AND ANAEROBIC 5CC  Final   Culture  Setup Time   Final    GRAM POSITIVE COCCI IN CLUSTERS IN BOTH AEROBIC AND ANAEROBIC BOTTLES CRITICAL VALUE NOTED.  VALUE IS CONSISTENT WITH PREVIOUSLY REPORTED AND CALLED VALUE.    Culture (A)  Final    STAPHYLOCOCCUS AUREUS SUSCEPTIBILITIES PERFORMED ON PREVIOUS CULTURE WITHIN THE LAST 5 DAYS. Performed at [facility], [place] [address], [place], [place] [address]    Report Status 11/20/2016 FINAL  Final  Surgical pcr screen     Status: Abnormal   Collection Time: 11/17/16  1:14 PM  Result Value Ref Range Status   MRSA, PCR NEGATIVE NEGATIVE Final   Staphylococcus aureus POSITIVE (A) NEGATIVE Final    Comment:        The Xpert SA Assay (FDA approved for NASAL  specimens in patients over 22 years of age), is one component of a comprehensive surveillance program.  Test performance has been validated by [facility] for patients greater than or equal to 35 year old. It is not intended to diagnose infection nor to guide or monitor treatment.   Aerobic/Anaerobic Culture (surgical/deep wound)     Status: None   Collection Time: 11/17/16  3:16 PM  Result Value Ref Range Status   Specimen Description ABSCESS CERVICAL EPIDURAL  Final   Special Requests NONE  Final   Gram Stain   Final    FEW WBC PRESENT, PREDOMINANTLY PMN ABUNDANT GRAM POSITIVE COCCI IN CLUSTERS    Culture   Final    ABUNDANT STAPHYLOCOCCUS AUREUS SUSCEPTIBILITIES PERFORMED ON PREVIOUS CULTURE WITHIN THE LAST 5 DAYS. NO ANAEROBES ISOLATED    Report Status 11/23/2016 FINAL  Final  Aerobic/Anaerobic Culture (surgical/deep wound)     Status: None   Collection Time: 11/17/16  3:23 PM  Result Value Ref Range Status   Specimen Description ABSCESS SOFT TISSUE  Final   Special Requests CERVICAL EPIDURAL ABSCESS  Final   Gram Stain   Final    FEW WBC PRESENT, PREDOMINANTLY PMN ABUNDANT GRAM POSITIVE COCCI IN PAIRS MODERATE GRAM POSITIVE COCCI IN CLUSTERS    Culture   Final    ABUNDANT STAPHYLOCOCCUS AUREUS NO ANAEROBES ISOLATED    Report Status 11/23/2016 FINAL  Final   Organism ID, Bacteria STAPHYLOCOCCUS AUREUS  Final      Susceptibility   Staphylococcus aureus - MIC*    CIPROFLOXACIN <=0.5 SENSITIVE Sensitive     ERYTHROMYCIN >=8 RESISTANT Resistant     GENTAMICIN <=0.5 SENSITIVE Sensitive     OXACILLIN <=0.25 SENSITIVE Sensitive     TETRACYCLINE <=1 SENSITIVE Sensitive     VANCOMYCIN <=0.5 SENSITIVE Sensitive     TRIMETH/SULFA <=10 SENSITIVE Sensitive     CLINDAMYCIN <=0.25 SENSITIVE Sensitive     RIFAMPIN <=0.5 SENSITIVE Sensitive     Inducible Clindamycin NEGATIVE Sensitive     * ABUNDANT STAPHYLOCOCCUS AUREUS  Culture, blood (Routine X 2) w Reflex to ID Panel      Status: Abnormal   Collection Time: 11/18/16 11:14 AM  Result Value Ref Range Status   Specimen Description BLOOD LEFT ARM  Final   Special Requests BOTTLES DRAWN AEROBIC AND ANAEROBIC 10CC EA  Final   Culture  Setup Time   Final    GRAM POSITIVE COCCI IN CLUSTERS IN BOTH AEROBIC AND ANAEROBIC BOTTLES CRITICAL RESULT CALLED TO, READ BACK BY AND VERIFIED WITH: T STONE,PHARMD AT 0943 11/20/16 BY L BENFIELD    Culture STAPHYLOCOCCUS AUREUS (A)  Final   Report Status 11/22/2016 FINAL  Final   Organism ID, Bacteria STAPHYLOCOCCUS AUREUS  Final      Susceptibility   Staphylococcus aureus - MIC*    CIPROFLOXACIN <=0.5 SENSITIVE Sensitive     ERYTHROMYCIN >=8 RESISTANT Resistant     GENTAMICIN <=0.5 SENSITIVE Sensitive     OXACILLIN <=0.25 SENSITIVE Sensitive     TETRACYCLINE <=1 SENSITIVE Sensitive     VANCOMYCIN 1 SENSITIVE Sensitive     TRIMETH/SULFA <=10 SENSITIVE Sensitive     CLINDAMYCIN <=0.25 SENSITIVE Sensitive     RIFAMPIN <=0.5 SENSITIVE Sensitive     Inducible Clindamycin NEGATIVE Sensitive     * STAPHYLOCOCCUS AUREUS  Blood Culture ID Panel (Reflexed)     Status: Abnormal   Collection Time: 11/18/16 11:14 AM  Result Value Ref Range Status   Enterococcus species NOT DETECTED NOT DETECTED Final   Listeria monocytogenes NOT DETECTED NOT DETECTED Final   Staphylococcus species DETECTED (A) NOT DETECTED Final    Comment: CRITICAL RESULT CALLED TO, READ BACK BY AND VERIFIED WITH: T STONE,PHARMD AT 4765 11/20/16 BY L BENFIELD    Staphylococcus aureus DETECTED (A) NOT DETECTED Final    Comment: Methicillin (oxacillin) susceptible Staphylococcus aureus (MSSA). Preferred therapy is anti staphylococcal beta lactam antibiotic (Cefazolin or Nafcillin), unless clinically contraindicated. CRITICAL RESULT CALLED TO, READ BACK BY AND VERIFIED WITH: T STONE,PHARMD AT 0943 11/20/16 BY L BENFIELD    Methicillin resistance NOT DETECTED NOT DETECTED Final   Streptococcus species NOT DETECTED NOT  DETECTED Final   Streptococcus agalactiae NOT DETECTED NOT DETECTED Final   Streptococcus pneumoniae NOT DETECTED NOT DETECTED Final   Streptococcus pyogenes NOT DETECTED NOT DETECTED Final   Acinetobacter baumannii NOT DETECTED NOT DETECTED Final   Enterobacteriaceae species NOT DETECTED NOT DETECTED Final   Enterobacter cloacae complex NOT DETECTED NOT DETECTED Final   Escherichia coli NOT DETECTED NOT DETECTED Final   Klebsiella oxytoca NOT DETECTED NOT DETECTED Final   Klebsiella pneumoniae NOT DETECTED NOT DETECTED Final   Proteus species NOT DETECTED NOT DETECTED Final   Serratia marcescens NOT DETECTED NOT DETECTED Final   Haemophilus influenzae NOT DETECTED NOT DETECTED Final   Neisseria meningitidis NOT DETECTED NOT DETECTED Final   Pseudomonas aeruginosa NOT DETECTED NOT DETECTED Final   Candida albicans NOT DETECTED NOT DETECTED Final   Candida glabrata NOT DETECTED NOT DETECTED Final   Candida krusei NOT DETECTED NOT DETECTED Final   Candida parapsilosis NOT DETECTED NOT DETECTED Final   Candida tropicalis NOT DETECTED NOT DETECTED Final  Culture, blood (Routine X 2) w Reflex to ID Panel     Status: Abnormal   Collection Time: 11/18/16 11:19 AM  Result Value Ref Range Status   Specimen Description BLOOD LEFT ANTECUBITAL  Final   Special Requests BOTTLES DRAWN AEROBIC AND ANAEROBIC 10CC EA  Final   Culture  Setup Time   Final    GRAM POSITIVE COCCI ANAEROBIC BOTTLE ONLY CRITICAL VALUE NOTED.  VALUE IS CONSISTENT WITH PREVIOUSLY REPORTED AND CALLED VALUE.    Culture (A)  Final    STAPHYLOCOCCUS AUREUS SUSCEPTIBILITIES PERFORMED ON PREVIOUS CULTURE WITHIN THE LAST  5 DAYS.    Report Status 11/22/2016 FINAL  Final  Culture, blood (Routine X 2) w Reflex to ID Panel     Status: None   Collection Time: 11/20/16 10:23 AM  Result Value Ref Range Status   Specimen Description BLOOD LEFT ANTECUBITAL  Final   Special Requests BOTTLES DRAWN AEROBIC AND ANAEROBIC 5CC EACH  Final     Culture NO GROWTH 5 DAYS  Final   Report Status 11/25/2016 FINAL  Final  Culture, blood (Routine X 2) w Reflex to ID Panel     Status: None   Collection Time: 11/20/16 10:29 AM  Result Value Ref Range Status   Specimen Description BLOOD RIGHT ANTECUBITAL  Final   Special Requests BOTTLES DRAWN AEROBIC AND ANAEROBIC 5CC EACH  Final   Culture NO GROWTH 5 DAYS  Final   Report Status 11/25/2016 FINAL  Final  Aerobic/Anaerobic Culture (surgical/deep wound)     Status: None (Preliminary result)   Collection Time: 11/26/16 11:00 AM  Result Value Ref Range Status   Specimen Description WOUND SPEC A  Final   Special Requests SUPERFICIAL CERVICAL WOUND  Final   Gram Stain NO WBC SEEN RARE GRAM POSITIVE COCCI IN PAIRS   Final   Culture PENDING  Incomplete   Report Status PENDING  Incomplete  Aerobic/Anaerobic Culture (surgical/deep wound)     Status: None (Preliminary result)   Collection Time: 11/26/16 11:02 AM  Result Value Ref Range Status   Specimen Description WOUND SPEC B  Final   Special Requests DEEP CERVICAL WOUND  Final   Gram Stain NO WBC SEEN RARE GRAM POSITIVE COCCI IN PAIRS   Final   Culture PENDING  Incomplete   Report Status PENDING  Incomplete    Thomas Nielsen, Thomas Nielsen, [place] [facility] [email or web address] [phone] pager  [phone] cell 11/26/2016, 3:16 PM

## 2016-11-26 NOTE — Anesthesia Preprocedure Evaluation (Signed)
Anesthesia Evaluation  Patient identified by MRN, date of birth, ID band Patient awake    Reviewed: Allergy & Precautions, NPO status , Patient's Chart, lab work & pertinent test results  History of Anesthesia Complications Negative for: history of anesthetic complications  Airway Mallampati: III  TM Distance: >3 FB Neck ROM: Full    Dental  (+) Teeth Intact, Dental Advisory Given, Missing   Pulmonary former smoker,    Pulmonary exam normal breath sounds clear to auscultation       Cardiovascular hypertension, Pt. 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  Reproductive/Obstetrics                             Anesthesia Physical Anesthesia Plan  ASA: II  Anesthesia Plan: General   Post-op Pain Management:    Induction: Intravenous  Airway Management Planned: Oral ETT  Additional Equipment:   Intra-op Plan:   Post-operative Plan: Extubation in OR  Informed Consent:   Dental advisory given  Plan Discussed with: Anesthesiologist, Surgeon and CRNA  Anesthesia Plan Comments:         Anesthesia Quick Evaluation

## 2016-11-26 NOTE — Progress Notes (Signed)
Pt transferred to OR via bed. Report called to OR prior to transport.

## 2016-11-26 NOTE — Op Note (Signed)
11/25/2016 - 11/26/2016  11:27 AM  PATIENT:  Thomas Nielsen  61 y.o. male  PRE-OPERATIVE DIAGNOSIS:  Post-operative wound infection after C6-7 ACDF for epidural abscess  POST-OPERATIVE DIAGNOSIS:  Same  PROCEDURE:  Wound exploration, drainage of abscess  SURGEON:  Aldean Ast, MD  ASSISTANTS: Ferne Reus, PA-C  ANESTHESIA:   General  DRAINS: JP   SPECIMEN:  Superficial and deep wound cultures  INDICATION FOR PROCEDURE: 61 year old male s/p recent ACDF for epidural abscess with wound infection. Patient understood the risks, benefits, and alternatives and potential outcomes and wished to proceed.  PROCEDURE DETAILS: After smooth induction of general endotracheal anesthesia the patient was positioned supine on the operating table.  The operative site was prepped and draped in the usual sterile fashion.  The incision was opened by spreading with metzenbaum scissors and cutting the vicryl sutures.  There was purulent material above the level of the platysma.  This was cultured.  The platysma was opened and there was purulent material tracking down to the level of the spine.  I cultured this as well.  I irrigated vigorously with bacitracin saline.  I probed and did not identify any additional loculated collections of pus.  I placed a JP drain in the prevertebral space.  I placed approximately 500mg  of vancomycin powder at the prevertebral space.  I then closed the wound in routine anatomic layers with interrupted vicryl sutures.  The skin was sealed with steristrips.  PATIENT DISPOSITION:  PACU - hemodynamically stable.   Delay start of Pharmacological VTE agent (>24hrs) due to surgical blood loss or risk of bleeding:  yes

## 2016-11-26 NOTE — H&P (Signed)
CC:  Chief Complaint  Patient presents with  . Urinary Retention    HPI: Thomas Nielsen is a 61 y.o. male who presented to ER for urinary retention - per pt this is a chronic issues after surgery & for wound check. He is s/p decompressive acdf C6-7 with evacuation of epidural abscess on 2/15. Cx grew MSSA. ID was consulted for treatment as well. PICC line in place.On Rocephin. Has been followed by home health. He reports they have not been checking his wound. ER removed C-collar today to check wound and it was covered in purulent discharge. He denies fevers or pain at site. Did not notice that it was draining. Denies neurological symptoms.    PMH: Past Medical History:  Diagnosis Date  . Anemia, unspecified 09/06/2013  . CKD (chronic kidney disease)    on chronic low dose Prednisone  . HTN (hypertension)   . Hyperlipidemia   . ITP (idiopathic thrombocytopenic purpura) 2011   on chronic low dose Prednisone  . 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 ANTERIOR CERVICAL DECOMPRESSION FOR EPIDURAL ABSCESS N/A 11/17/2016   Procedure: ANTERIOR CERVICAL DECOMPRESSION FOR EPIDURAL ABSCESS CERVICAL SIX-SEVEN;  Surgeon: Tia Alert, MD;  [facility];  Service: Neurosurgery;  Laterality: N/A;  . HERNIA REPAIR    . kidney biospy      SH: Social History  Substance Use Topics  . Smoking status: Former Smoker    Packs/day: 1.00    Years: 30.00    Quit date: 09/06/2010  . Smokeless tobacco: Never Used  . Alcohol use No    MEDS: Prior to Admission medications   Medication Sig Start Date End Date Taking? Authorizing Provider  calcium carbonate (OS-CAL) 600 MG TABS Take 600 mg by mouth daily.   Yes Historical Provider, MD  cefTRIAXone 2 g in dextrose 5 % 50 mL Inject 2 g into the vein daily. 11/23/16  Yes Tia Alert, MD  enalapril (VASOTEC) 10 MG tablet Take 10 mg by mouth daily.  12/02/11  Yes Historical Provider, MD   oxyCODONE (OXY IR/ROXICODONE) 5 MG immediate release tablet Take 1 tablet (5 mg total) by mouth every 6 (six) hours as needed for breakthrough pain. 11/23/16  Yes Tia Alert, MD  predniSONE (DELTASONE) 5 MG tablet Take 1 tablet (5 mg total) by mouth daily. 01/31/12  Yes Exie Parody, MD  rifampin (RIFADIN) 300 MG capsule Take 1 capsule (300 mg total) by mouth 2 (two) times daily with a meal. 11/23/16  Yes Tia Alert, MD  cefTRIAXone (ROCEPHIN) IVPB Inject 2 g into the vein daily. Indication:  MSSA bacteremia and epidural abscess Last Day of Therapy: 01/01/17 Labs - Once weekly:  CBC/D and BMP, Labs - Every other week:  ESR and CRP 11/23/16 01/01/17  Tia Alert, MD    ALLERGY: Allergies  Allergen Reactions  . Lipitor [Atorvastatin] Other (See Comments)    Heart races  . Penicillins Rash and Other (See Comments)    "I break out in between my fingers, rash, hives or whatever"    ROS: Review of Systems  Constitutional: Negative for chills and fever.  HENT: Negative.   Eyes: Negative.   Respiratory: Negative.   Cardiovascular: Negative.   Gastrointestinal: Negative.   Genitourinary: Negative.   Musculoskeletal: Negative for myalgias and neck pain.  Skin: Negative for itching and rash.  Neurological: Negative for dizziness, tingling, tremors, sensory change, speech change, focal weakness, seizures,  loss of consciousness, weakness and headaches.  Endo/Heme/Allergies: Bruises/bleeds easily.    NEUROLOGIC EXAM: Awake, alert, oriented Memory and concentration grossly intact Speech fluent, appropriate CN grossly intact Motor exam: Upper Extremities Deltoid Bicep Tricep Grip  Right 5/5 5/5 5/5 5/5  Left 5/5 5/5 5/5 5/5   Lower Extremity IP Quad PF DF EHL  Right 5/5 5/5 5/5 5/5 5/5  Left 5/5 5/5 5/5 5/5 5/5   Sensation grossly intact to LT Surgical site right lateral neck with surrounding erythema, warmth. Actively draining purulent discharge. No tenderness to  palpation.  IMAGING: None  IMPRESSION: - 61 y.o. male presented to ER for urinary retention and wound recheck. Foley catheter has been placed for urinary retention by ER. Wound is draining purulent discharge. He is neurologically intact.  PLAN: - Admitted to [facility] for management. Scheduled for cervical wound exploration irrigation and debridement for tomorrow. Risks and benefits were discussed at length with patient and family. All questions were sought and answered. They state their understanding of risks, benefits and alternatives to surgery and wish to proceed. Scheduled for 9am tomorrow morning.  - Urinary retention: catheter in place. Will consult urology

## 2016-11-26 NOTE — Anesthesia Procedure Notes (Signed)
Procedure Name: Intubation Date/Time: 11/26/2016 10:34 AM Performed by: Clearnce Sorrel Pre-anesthesia Checklist: Patient identified, Emergency Drugs available, Suction available, Patient being monitored and Timeout performed Patient Re-evaluated:Patient Re-evaluated prior to inductionOxygen Delivery Method: Circle system utilized Preoxygenation: Pre-oxygenation with 100% oxygen Intubation Type: IV induction Ventilation: Mask ventilation without difficulty Laryngoscope Size: Glidescope and 4 (limited neck ROM) Grade View: Grade I Tube type: Oral Tube size: 7.5 mm Number of attempts: 1 Airway Equipment and Method: Stylet Placement Confirmation: ETT inserted through vocal cords under direct vision,  positive ETCO2 and breath sounds checked- equal and bilateral Secured at: 23 cm Tube secured with: Tape Dental Injury: Teeth and Oropharynx as per pre-operative assessment

## 2016-11-26 NOTE — Progress Notes (Signed)
Per Dr. Cyndy Freeze patient needs CT scan prior to surgery. OR and CT notified.

## 2016-11-26 NOTE — Transfer of Care (Signed)
Immediate Anesthesia Transfer of Care Note  Patient: Thomas Nielsen  Procedure(s) Performed: Procedure(s): CERVICAL WOUND EXPLORATION, IRRIGATION DEBRIDEMENT (N/A)  Patient Location: PACU  Anesthesia Type:General  Level of Consciousness: awake, alert  and oriented  Airway & Oxygen Therapy: Patient Spontanous Breathing and Patient connected to nasal cannula oxygen  Post-op Assessment: Report given to RN and Post -op Vital signs reviewed and stable  Post vital signs: Reviewed and stable  Last Vitals:  Vitals:   11/26/16 0500 11/26/16 1132  BP: 118/78 131/82  Pulse: 89 89  Resp: 20 15  Temp: 37.1 C     Last Pain:  Vitals:   11/26/16 0500  TempSrc: Oral  PainSc:          Complications: No apparent anesthesia complications

## 2016-11-27 ENCOUNTER — Encounter (HOSPITAL_COMMUNITY): Payer: Self-pay | Admitting: Neurological Surgery

## 2016-11-27 LAB — BLOOD CULTURE ID PANEL (REFLEXED)
Acinetobacter baumannii: NOT DETECTED
CANDIDA GLABRATA: NOT DETECTED
CANDIDA KRUSEI: NOT DETECTED
CANDIDA TROPICALIS: NOT DETECTED
Candida albicans: NOT DETECTED
Candida parapsilosis: NOT DETECTED
ENTEROBACTER CLOACAE COMPLEX: NOT DETECTED
ESCHERICHIA COLI: NOT DETECTED
Enterobacteriaceae species: NOT DETECTED
Enterococcus species: NOT DETECTED
Haemophilus influenzae: NOT DETECTED
KLEBSIELLA PNEUMONIAE: NOT DETECTED
Klebsiella oxytoca: NOT DETECTED
LISTERIA MONOCYTOGENES: NOT DETECTED
Methicillin resistance: NOT DETECTED
NEISSERIA MENINGITIDIS: NOT DETECTED
PROTEUS SPECIES: NOT DETECTED
Pseudomonas aeruginosa: NOT DETECTED
SERRATIA MARCESCENS: NOT DETECTED
STAPHYLOCOCCUS AUREUS BCID: DETECTED — AB
STAPHYLOCOCCUS SPECIES: DETECTED — AB
STREPTOCOCCUS AGALACTIAE: NOT DETECTED
Streptococcus pneumoniae: NOT DETECTED
Streptococcus pyogenes: NOT DETECTED
Streptococcus species: NOT DETECTED

## 2016-11-27 LAB — URINE CULTURE: CULTURE: NO GROWTH

## 2016-11-27 MED ORDER — DOCUSATE SODIUM 100 MG PO CAPS
100.0000 mg | ORAL_CAPSULE | Freq: Two times a day (BID) | ORAL | Status: DC
  Administered 2016-11-27 – 2016-11-28 (×4): 100 mg via ORAL
  Filled 2016-11-27 (×5): qty 1

## 2016-11-27 MED ORDER — CEFAZOLIN SODIUM-DEXTROSE 2-4 GM/100ML-% IV SOLN
2.0000 g | Freq: Three times a day (TID) | INTRAVENOUS | Status: DC
  Administered 2016-11-27 – 2016-11-29 (×6): 2 g via INTRAVENOUS
  Filled 2016-11-27 (×8): qty 100

## 2016-11-27 MED ORDER — TAMSULOSIN HCL 0.4 MG PO CAPS
0.4000 mg | ORAL_CAPSULE | Freq: Every day | ORAL | Status: DC
  Administered 2016-11-27 – 2016-11-28 (×2): 0.4 mg via ORAL
  Filled 2016-11-27 (×3): qty 1

## 2016-11-27 NOTE — Progress Notes (Signed)
[facility]  Active pt with [facility] prior to this readmission. Pt was admitted to [facility] services on [facility] 11/24/16 with orders for  Rocephin 2gms Q24hrs via IVP thru 01/01/17.  [facility] hospital team will follow Thomas Nielsen while an inpatient to support transition home when ordered.  If patient discharges after hours, please call [phone].   Larry Sierras 11/27/2016, 9:12 PM

## 2016-11-27 NOTE — Progress Notes (Signed)
Patient ambulated in hallway approximately 25 yards with one assist from RN and RW, no complaints of pain.  Patient to recliner upon returning to chair. Foley to stay in place until urology consults with patient.  Continue to monitor patient.

## 2016-11-27 NOTE — Progress Notes (Signed)
Pt seen and examined. No issues overnight.  EXAM: Temp:  [97.9 F (36.6 C)-99.9 F (37.7 C)] 97.9 F (36.6 C) (02/25 0615) Pulse Rate:  [81-113] 92 (02/25 0615) Resp:  [12-19] 18 (02/25 0615) BP: (108-131)/(69-87) 122/73 (02/25 0615) SpO2:  [95 %-99 %] 95 % (02/25 0615) Intake/Output      02/24 0701 - 02/25 0700 02/25 0701 - 02/26 0700   I.V. (mL/kg) 717.3 (9.2)    IV Piggyback 100    Total Intake(mL/kg) 817.3 (10.5)    Urine (mL/kg/hr) 4850 (2.6)    Drains 25 (0)    Blood 25 (0)    Total Output 4900     Net -4082.7           Awake and alert Follows commands throughout Full strength  Stable Continue current care Keep drain for now

## 2016-11-27 NOTE — Progress Notes (Addendum)
Pt seen and examined.  No issues overnight. Reports pain is well controlled. Tolerating po. Denies bowel or bladder dysfunction. No neurological symptoms  EXAM: Temp:  [97.9 F (36.6 C)-99.9 F (37.7 C)] 97.9 F (36.6 C) (02/25 0615) Pulse Rate:  [81-113] 92 (02/25 0615) Resp:  [12-19] 18 (02/25 0615) BP: (108-131)/(69-87) 122/73 (02/25 0615) SpO2:  [95 %-99 %] 95 % (02/25 0615) Intake/Output      02/24 0701 - 02/25 0700   I.V. (mL/kg) 717.3 (9.2)   IV Piggyback 100   Total Intake(mL/kg) 817.3 (10.5)   Urine (mL/kg/hr) 4850 (2.6)   Drains 15 (0)   Blood 25 (0)   Total Output 4890   Net -4072.7        Awake and alert Follows commands throughout Full strength Surgical site looks fine. Some erythema and edema.  Plan Still pending culture from surgery Continue current care Still waiting on urology consult   Addendum Blood cultures positive for infection. Already on treatment.  Still pending culture from recent surgery

## 2016-11-27 NOTE — Progress Notes (Signed)
PHARMACY - PHYSICIAN COMMUNICATION CRITICAL VALUE ALERT - BLOOD CULTURE IDENTIFICATION (BCID)  Results for orders placed or performed during the hospital encounter of 11/25/16  Blood Culture ID Panel (Reflexed) (Collected: 11/26/2016 12:38 AM)  Result Value Ref Range   Enterococcus species NOT DETECTED NOT DETECTED   Listeria monocytogenes NOT DETECTED NOT DETECTED   Staphylococcus species DETECTED (A) NOT DETECTED   Staphylococcus aureus DETECTED (A) NOT DETECTED   Methicillin resistance NOT DETECTED NOT DETECTED   Streptococcus species NOT DETECTED NOT DETECTED   Streptococcus agalactiae NOT DETECTED NOT DETECTED   Streptococcus pneumoniae NOT DETECTED NOT DETECTED   Streptococcus pyogenes NOT DETECTED NOT DETECTED   Acinetobacter baumannii NOT DETECTED NOT DETECTED   Enterobacteriaceae species NOT DETECTED NOT DETECTED   Enterobacter cloacae complex NOT DETECTED NOT DETECTED   Escherichia coli NOT DETECTED NOT DETECTED   Klebsiella oxytoca NOT DETECTED NOT DETECTED   Klebsiella pneumoniae NOT DETECTED NOT DETECTED   Proteus species NOT DETECTED NOT DETECTED   Serratia marcescens NOT DETECTED NOT DETECTED   Haemophilus influenzae NOT DETECTED NOT DETECTED   Neisseria meningitidis NOT DETECTED NOT DETECTED   Pseudomonas aeruginosa NOT DETECTED NOT DETECTED   Candida albicans NOT DETECTED NOT DETECTED   Candida glabrata NOT DETECTED NOT DETECTED   Candida krusei NOT DETECTED NOT DETECTED   Candida parapsilosis NOT DETECTED NOT DETECTED   Candida tropicalis NOT DETECTED NOT DETECTED    Name of physician (or Provider) Contacted: V Costella PAC  Changes to prescribed antibiotics required: Appropriate covered and being followed by ID, no changes for now.  Wynona Neat, PharmD, BCPS  11/27/2016  7:15 AM

## 2016-11-27 NOTE — Progress Notes (Signed)
Pharmacy Antibiotic Note Thomas Nielsen is a 61 y.o. male admitted on 11/25/2016 with right lateral neck infection. S/p I&D on 2/24. Recent admission for MSSA bacteremia and cervical spinal epidural abscess s/p ACDF and subsequent discharge home on IV Ceftriaxone and Rifampin PO. Pharmacy has been consulted to change cefepime to cefazolin for MSSA bacteremia.  Plan: D/c cefepime Start cefazolin 2g IV Q8H  Height: 5\' 11"  (180.3 cm) Weight: 172 lb (78 kg) IBW/kg (Calculated) : 75.3  Temp (24hrs), Avg:98.3 F (36.8 C), Min:97.9 F (36.6 C), Max:99.9 F (37.7 C)   Recent Labs Lab 11/21/16 1511 11/22/16 0501 11/26/16 0022  WBC 10.9*  --  11.2*  CREATININE 1.26* 1.15 1.09    Estimated Creatinine Clearance: 74.8 mL/min (by C-G formula based on SCr of 1.09 mg/dL).    Allergies  Allergen Reactions  . Lipitor [Atorvastatin] Other (See Comments)    Heart races  . Penicillins Rash and Other (See Comments)    "I break out in between my fingers, rash, hives or whatever"    Antimicrobials this admission: 2/25 cefazolin >> 2/24 Cefepime >>2/25 2/24 Rifampin >>  CTX PTA   Microbiology results: 2/24 WCx: pending 2/24 BCx: MSSA 2/18 BCx: ngF 2/16 BCx: MSSA 2/15 BCx: MSSA 2/15: Cervical epidural abscess: MSSA  Thank you for allowing pharmacy to be a part of this patient's care.  Gwenlyn Perking, PharmD PGY1 Pharmacy Resident Pager: [phone] 11/27/2016 9:59 AM

## 2016-11-28 ENCOUNTER — Inpatient Hospital Stay (HOSPITAL_COMMUNITY): Payer: BLUE CROSS/BLUE SHIELD

## 2016-11-28 DIAGNOSIS — R0902 Hypoxemia: Secondary | ICD-10-CM

## 2016-11-28 LAB — ECHOCARDIOGRAM LIMITED
CHL CUP STROKE VOLUME: 44 mL
FS: 43 % (ref 28–44)
Height: 71 in
IV/PV OW: 1.11
LA vol: 44.1 mL
LADIAMINDEX: 1.68 cm/m2
LASIZE: 33 mm
LAVOLA4C: 40.9 mL
LAVOLIN: 22.4 mL/m2
LEFT ATRIUM END SYS DIAM: 33 mm
LV PW d: 9 mm — AB (ref 0.6–1.1)
LV dias vol: 74 mL (ref 62–150)
LV sys vol: 30 mL (ref 21–61)
LVDIAVOLIN: 37 mL/m2
LVOT area: 5.73 cm2
LVOT diameter: 27 mm
LVSYSVOLIN: 15 mL/m2
Simpson's disk: 60
Weight: 2752 oz

## 2016-11-28 NOTE — Progress Notes (Signed)
Patient voided twice today post foley removal; first void was 300 ml with post void bladder scan of >429ml. Patient requested to attempt to void again before in and out cath for >351ml. Patient voided 182ml with post void bladder scan of 370ml. Patient in and out catherized for 340 ml residual urine; encouraged po intake and continue to monitor.

## 2016-11-28 NOTE — Progress Notes (Signed)
  Echocardiogram 2D Echocardiogram has been performed.  Jennette Dubin 11/28/2016, 11:45 AM

## 2016-11-28 NOTE — Care Management Note (Signed)
Case Management Note  Patient Details  Name: REIK INGERSON MRN: [id number] Date of Birth: [birth year]  Subjective/Objective:         Patient was admitted with post-op wound infection. Underwent an CERVICAL WOUND EXPLORATION, IRRIGATION DEBRIDEMENT.  Admitted from home, where he is active with [facility] for IV Rocephin management.  CM will follow for discharge needs.     Action/Plan:   Expected Discharge Date:                  Expected Discharge Plan:     In-House Referral:     Discharge planning Services     Post Acute Care Choice:    Choice offered to:     DME Arranged:    DME Agency:     HH Arranged:    HH Agency:     Status of Service:     If discussed at [employer] of Stay Meetings, dates discussed:    Additional Comments:  Rolm Baptise, RN 11/28/2016, 11:54 AM

## 2016-11-28 NOTE — Progress Notes (Signed)
[facility] for Infectious Disease    Date of Admission:  11/25/2016   Total days of antibiotics         Day 3 cefazolin        Day 3 rif           ID: Thomas Nielsen is a 61 y.o. male with He has a wound infection following recent cervical spinal epidural abscess s/p ACDF and evacuation done 2/15 and blood cultures and wound with MSSA.  He has been on ceftriaxone and rifampin and now with wound infection.  Principal Problem:   Abscess in epidural space of cervical spine    Subjective: afebrile  TTE shows some abnormality  Medications:  . calcium carbonate  1,250 mg Oral Q breakfast  .  ceFAZolin (ANCEF) IV  2 g Intravenous Q8H  . celecoxib  200 mg Oral BID  . docusate sodium  100 mg Oral BID  . enalapril  10 mg Oral Daily  . methocarbamol  750 mg Oral QID  . predniSONE  5 mg Oral Daily  . rifampin  300 mg Oral BID WC  . tamsulosin  0.4 mg Oral QPC breakfast    Objective: Vital signs in last 24 hours: Temp:  [98 F (36.7 C)-98.2 F (36.8 C)] 98.2 F (36.8 C) (02/26 1405) Pulse Rate:  [91-102] 99 (02/26 1405) Resp:  [16-18] 18 (02/26 1405) BP: (101-113)/(65-78) 106/75 (02/26 1405) SpO2:  [95 %-98 %] 98 % (02/26 1405) Physical Exam  Constitutional: He is oriented to person, place, and time. He appears well-developed and well-nourished. No distress.  HENT:  Mouth/Throat: Oropharynx is clear and moist. No oropharyngeal exudate.  Cardiovascular: Normal rate, regular rhythm and normal heart sounds. Exam reveals no gallop and no friction rub.  No murmur heard.  Pulmonary/Chest: Effort normal and breath sounds normal. No respiratory distress. He has no wheezes.  Neck= serosanginous fluid in bulb draining anterior neck incision Abdominal: Soft. Bowel sounds are normal. He exhibits no distension. There is no tenderness.  Lymphadenopathy:  He has no cervical adenopathy.  Neurological: He is alert and oriented to person, place, and time.  Skin: Skin is warm and dry. No  rash noted. No erythema.  Ext: right picc line is c/d/i Psychiatric: He has a normal mood and affect. His behavior is normal.     Lab Results  Recent Labs  11/26/16 0022  WBC 11.2*  HGB 11.2*  HCT 32.0*  NA 135  K 4.4  CL 101  CO2 21*  BUN 27*  CREATININE 1.09   Lab Results  Component Value Date   ESRSEDRATE 94 (H) 11/18/2016   Lab Results  Component Value Date   CRP 32.6 (H) 11/18/2016    Microbiology: 2/24 blood cx 1 of 4 bottles + MSSA 2/24 cervical sup cx MSSA 2/24 cervical deep cx MSSA  Studies/Results: TTE on 2/26: Left ventricle: The cavity size was normal. Systolic function was   normal. The estimated ejection fraction was in the range of 55%   to 60%. Wall motion was normal; there were no regional wall   motion abnormalities. The study is not technically sufficient to   allow evaluation of LV diastolic function. - Mitral valve: Calcified annulus. - Right ventricle: Basal short axis view shows mobile struture that   is likely trabeculation in RVOT can consider f/u TEE if   clinically indicated. - Atrial septum: No defect or patent foramen ovale was identified.  Assessment/Plan:  Cervical epidural abscess c/b MSSA  bacteremia despite treatment with ceftriaxone last week = continue on cefazolin plus rifampin = TTE suggests abn but would be worth getting TEE, I have contacted cardiology coordinator for TEE = since he has recurrence of MSSA bacteremia, we will need to pull his current picc line and give him a line holiday = please pull current picc line and get peripheral IVs = will check cbc with diff  Urinary obstruction = continue with flomax and bladder scan  [facility], [facility] Cell: [phone] Pager: [phone]  11/28/2016, 3:07 PM

## 2016-11-28 NOTE — Progress Notes (Signed)
Patient ID: Thomas Nielsen, male   DOB: 1956/01/19, 61 y.o.   MRN: [id number] Subjective: Patient reports no pain, had BM, no NTW  Objective: Vital signs in last 24 hours: Temp:  [98 F (36.7 C)-98.7 F (37.1 C)] 98.2 F (36.8 C) (02/26 0548) Pulse Rate:  [91-102] 91 (02/26 0548) Resp:  [16-18] 18 (02/26 0548) BP: (104-117)/(65-79) 113/73 (02/26 0548) SpO2:  [95 %-97 %] 96 % (02/26 0548)  Intake/Output from previous day: 02/25 0701 - 02/26 0700 In: 310 [I.V.:310] Out: 2610 [Urine:2600; Drains:10] Intake/Output this shift: Total I/O In: -  Out: 1000 [Urine:1000]  Neurologic: Grossly normal, JP with no real output this am, dressing dry  Lab Results: Lab Results  Component Value Date   WBC 11.2 (H) 11/26/2016   HGB 11.2 (L) 11/26/2016   HCT 32.0 (L) 11/26/2016   MCV 89.1 11/26/2016   PLT 469 (H) 11/26/2016   No results found for: INR, PROTIME BMET Lab Results  Component Value Date   NA 135 11/26/2016   K 4.4 11/26/2016   CL 101 11/26/2016   CO2 21 (L) 11/26/2016   GLUCOSE 114 (H) 11/26/2016   BUN 27 (H) 11/26/2016   CREATININE 1.09 11/26/2016   CALCIUM 9.5 11/26/2016    Studies/Results: Ct Soft Tissue Neck W Wo Contrast  Result Date: 11/26/2016 CLINICAL DATA:  Wound infection after cervical spine surgery 11/17/2016. EXAM: CT NECK WITH AND WITHOUT CONTRAST TECHNIQUE: Multidetector CT imaging of the neck was performed without and with intravenous contrast. CONTRAST:  75mL ISOVUE-300 IOPAMIDOL (ISOVUE-300) INJECTION 61% COMPARISON:  Preoperative MRI of cervical spine 11/17/2016. Cervical spine radiographs 11/26/2016. FINDINGS: Pharynx and larynx: No focal mucosal or submucosal lesions are present. Vocal cords are midline and symmetric. The oropharynx and nasopharynx are within normal limits. Parapharyngeal fat is clear. Salivary glands: The submandibular and parotid glands are normal bilaterally. Thyroid: Thyroid is unremarkable. Lymph nodes: No significant cervical  adenopathy is present. Vascular: Atherosclerotic calcifications are present at the carotid bifurcations bilaterally without significant stenosis. Limited intracranial: Within normal limits. Visualized orbits: Unremarkable. Mastoids and visualized paranasal sinuses: The visualized paranasal sinuses and mastoid air cells are clear. Skeleton: Anterior fusion hardware is present at or C6-7. The disc spacer is intact and in stable position. Prevertebral edema is present at and just above the surgical level. Edematous changes extend into the right neck and surrounding the carotid space and sheath. There is some gas within the soft tissues on the right as well. There is fluid and hypoattenuation within the longus coli musculature on the right as well. There is no other discrete fluid collection to suggest abscess. Upper chest: The lung apices demonstrate scattered areas of ground-glass attenuation concerning for atelectasis. Early infection is not excluded. Atherosclerotic calcifications are present in the aorta. A right-sided PICC line is in place. IMPRESSION: 1. Extensive prevertebral edema extending into the right neck compatible with recent surgery. 2. Area of hypoattenuation within the longus coli musculature on the right on image 66 of series 7 raises concern for underlying infection. 3. No other discrete abscess is present. 4. Anterior fusion at C6-7 without change in hardware. 5. Scattered ground-glass attenuation in the upper lobes, left greater than right. While this may represent atelectasis, infection is also considered. Electronically Signed   By: San Morelle M.D.   On: 11/26/2016 10:40    Assessment/Plan: Doing well, d/c foley, continue drain, continue abx and await cultures   LOS: 2 days    Kursten Kruk S 11/28/2016, 7:27 AM

## 2016-11-28 NOTE — Progress Notes (Signed)
   Patient is scheduled for TEE on 11/29/16 for bacteremia with Dr. Oval Linsey. I discussed indication for procedure as well as procedural details including potential risk. Pt understands theses risk and agrees to proceed. RN to obtain written consent. NPO at midnight. TEE orders placed.   Thomas Nielsen Rosita Fire 11/28/2016

## 2016-11-29 ENCOUNTER — Inpatient Hospital Stay (HOSPITAL_COMMUNITY): Payer: BLUE CROSS/BLUE SHIELD

## 2016-11-29 ENCOUNTER — Encounter (HOSPITAL_COMMUNITY): Payer: Self-pay | Admitting: Cardiovascular Disease

## 2016-11-29 ENCOUNTER — Encounter (HOSPITAL_COMMUNITY)
Admission: EM | Disposition: A | Discharge status: Home Health | Payer: Self-pay | Source: Home / Self Care | Attending: Neurological Surgery

## 2016-11-29 DIAGNOSIS — I340 Nonrheumatic mitral (valve) insufficiency: Secondary | ICD-10-CM

## 2016-11-29 HISTORY — PX: TEE WITHOUT CARDIOVERSION: SHX5443

## 2016-11-29 LAB — BASIC METABOLIC PANEL
Anion gap: 10 (ref 5–15)
BUN: 21 mg/dL — ABNORMAL HIGH (ref 6–20)
CALCIUM: 10 mg/dL (ref 8.9–10.3)
CO2: 26 mmol/L (ref 22–32)
CREATININE: 1.22 mg/dL (ref 0.61–1.24)
Chloride: 100 mmol/L — ABNORMAL LOW (ref 101–111)
Glucose, Bld: 110 mg/dL — ABNORMAL HIGH (ref 65–99)
Potassium: 4.6 mmol/L (ref 3.5–5.1)
SODIUM: 136 mmol/L (ref 135–145)

## 2016-11-29 LAB — CULTURE, BLOOD (ROUTINE X 2)

## 2016-11-29 LAB — CBC
HCT: 33 % — ABNORMAL LOW (ref 39.0–52.0)
Hemoglobin: 10.7 g/dL — ABNORMAL LOW (ref 13.0–17.0)
MCH: 29.2 pg (ref 26.0–34.0)
MCHC: 32.4 g/dL (ref 30.0–36.0)
MCV: 90.2 fL (ref 78.0–100.0)
PLATELETS: 338 10*3/uL (ref 150–400)
RBC: 3.66 MIL/uL — AB (ref 4.22–5.81)
RDW: 13.5 % (ref 11.5–15.5)
WBC: 9.4 10*3/uL (ref 4.0–10.5)

## 2016-11-29 SURGERY — ECHOCARDIOGRAM, TRANSESOPHAGEAL
Anesthesia: Moderate Sedation

## 2016-11-29 MED ORDER — ZOLPIDEM TARTRATE 5 MG PO TABS: 5.0000 mg | ORAL_TABLET | Freq: Every evening | ORAL | Status: DC | PRN

## 2016-11-29 MED ORDER — MIDAZOLAM HCL 5 MG/ML IJ SOLN
INTRAMUSCULAR | Status: AC
  Filled 2016-11-29: qty 2

## 2016-11-29 MED ORDER — PREDNISONE 5 MG PO TABS
5.0000 mg | ORAL_TABLET | Freq: Every day | ORAL | Status: DC
  Administered 2016-11-29 – 2016-12-01 (×3): 5 mg via ORAL
  Filled 2016-11-29 (×3): qty 1

## 2016-11-29 MED ORDER — POLYETHYLENE GLYCOL 3350 17 G PO PACK: 17.0000 g | PACK | Freq: Every day | ORAL | Status: DC | PRN

## 2016-11-29 MED ORDER — CALCIUM CARBONATE 1250 (500 CA) MG PO TABS
1250.0000 mg | ORAL_TABLET | Freq: Every day | ORAL | Status: DC
  Administered 2016-11-30 – 2016-12-01 (×2): 1250 mg via ORAL
  Filled 2016-11-29 (×2): qty 1

## 2016-11-29 MED ORDER — SODIUM CHLORIDE 0.9% FLUSH: 3.0000 mL | INTRAVENOUS | Status: DC | PRN

## 2016-11-29 MED ORDER — FENTANYL CITRATE (PF) 100 MCG/2ML IJ SOLN
INTRAMUSCULAR | Status: DC | PRN
  Administered 2016-11-29: 25 ug via INTRAVENOUS

## 2016-11-29 MED ORDER — SODIUM CHLORIDE 0.9 % IV SOLN
INTRAVENOUS | Status: DC
  Administered 2016-11-29: 500 mL via INTRAVENOUS

## 2016-11-29 MED ORDER — SODIUM CHLORIDE 0.9 % IV SOLN: 250.0000 mL | INTRAVENOUS | Status: DC | PRN

## 2016-11-29 MED ORDER — SODIUM CHLORIDE 0.9 % IV SOLN
INTRAVENOUS | Status: DC
  Administered 2016-11-29: 10 mL/h via INTRAVENOUS
  Administered 2016-11-30: 20:00:00 via INTRAVENOUS

## 2016-11-29 MED ORDER — SODIUM CHLORIDE 0.9% FLUSH
3.0000 mL | Freq: Two times a day (BID) | INTRAVENOUS | Status: DC
  Administered 2016-11-30: 3 mL via INTRAVENOUS

## 2016-11-29 MED ORDER — MAGNESIUM CITRATE PO SOLN: 1.0000 | Freq: Once | ORAL | Status: DC | PRN

## 2016-11-29 MED ORDER — ONDANSETRON HCL 4 MG PO TABS: 4.0000 mg | ORAL_TABLET | Freq: Four times a day (QID) | ORAL | Status: DC | PRN

## 2016-11-29 MED ORDER — FENTANYL CITRATE (PF) 100 MCG/2ML IJ SOLN
INTRAMUSCULAR | Status: AC
  Filled 2016-11-29: qty 2

## 2016-11-29 MED ORDER — BISACODYL 10 MG RE SUPP: 10.0000 mg | Freq: Every day | RECTAL | Status: DC | PRN

## 2016-11-29 MED ORDER — ONDANSETRON HCL 4 MG/2ML IJ SOLN: 4.0000 mg | Freq: Four times a day (QID) | INTRAMUSCULAR | Status: DC | PRN

## 2016-11-29 MED ORDER — CEFTRIAXONE SODIUM-DEXTROSE 2-2.22 GM-% IV SOLR
2.0000 g | INTRAVENOUS | Status: DC
  Filled 2016-11-29: qty 50

## 2016-11-29 MED ORDER — ACETAMINOPHEN 650 MG RE SUPP: 650.0000 mg | Freq: Four times a day (QID) | RECTAL | Status: DC | PRN

## 2016-11-29 MED ORDER — MIDAZOLAM HCL 10 MG/2ML IJ SOLN
INTRAMUSCULAR | Status: DC | PRN
  Administered 2016-11-29: 2 mg via INTRAVENOUS

## 2016-11-29 MED ORDER — DEXTROSE 5 % IV SOLN
2.0000 g | INTRAVENOUS | Status: DC
  Administered 2016-11-29: 2 g via INTRAVENOUS
  Filled 2016-11-29: qty 2

## 2016-11-29 MED ORDER — METHOCARBAMOL 750 MG PO TABS
750.0000 mg | ORAL_TABLET | Freq: Four times a day (QID) | ORAL | Status: DC
  Administered 2016-11-29 – 2016-12-01 (×9): 750 mg via ORAL
  Filled 2016-11-29 (×9): qty 1

## 2016-11-29 MED ORDER — BUTAMBEN-TETRACAINE-BENZOCAINE 2-2-14 % EX AERO
INHALATION_SPRAY | CUTANEOUS | Status: DC | PRN
  Administered 2016-11-29: 2 via TOPICAL

## 2016-11-29 MED ORDER — ENALAPRIL MALEATE 5 MG PO TABS
10.0000 mg | ORAL_TABLET | Freq: Every day | ORAL | Status: DC
  Administered 2016-11-29 – 2016-12-01 (×3): 10 mg via ORAL
  Filled 2016-11-29 (×3): qty 2

## 2016-11-29 MED ORDER — TAMSULOSIN HCL 0.4 MG PO CAPS
0.4000 mg | ORAL_CAPSULE | Freq: Every day | ORAL | Status: DC
Start: 2016-11-29 — End: 2016-12-01
  Administered 2016-11-29 – 2016-12-01 (×3): 0.4 mg via ORAL
  Filled 2016-11-29 (×3): qty 1

## 2016-11-29 MED ORDER — CELECOXIB 200 MG PO CAPS
200.0000 mg | ORAL_CAPSULE | Freq: Two times a day (BID) | ORAL | Status: DC
  Administered 2016-11-29 – 2016-12-01 (×4): 200 mg via ORAL
  Filled 2016-11-29 (×4): qty 1

## 2016-11-29 MED ORDER — HYDROCODONE-ACETAMINOPHEN 5-325 MG PO TABS: 1.0000 | ORAL_TABLET | ORAL | Status: DC | PRN

## 2016-11-29 MED ORDER — OXYCODONE HCL 5 MG PO TABS
5.0000 mg | ORAL_TABLET | Freq: Four times a day (QID) | ORAL | Status: DC | PRN
  Administered 2016-12-01: 5 mg via ORAL
  Filled 2016-11-29: qty 1

## 2016-11-29 MED ORDER — SENNA 8.6 MG PO TABS
1.0000 | ORAL_TABLET | Freq: Two times a day (BID) | ORAL | Status: DC
  Administered 2016-11-29 – 2016-12-01 (×4): 8.6 mg via ORAL
  Filled 2016-11-29 (×4): qty 1

## 2016-11-29 MED ORDER — RIFAMPIN 300 MG PO CAPS
300.0000 mg | ORAL_CAPSULE | Freq: Two times a day (BID) | ORAL | Status: DC
  Administered 2016-11-29 – 2016-12-01 (×5): 300 mg via ORAL
  Filled 2016-11-29 (×5): qty 1

## 2016-11-29 MED ORDER — SODIUM CHLORIDE 0.9 % IV SOLN: INTRAVENOUS | Status: DC

## 2016-11-29 MED ORDER — ACETAMINOPHEN 325 MG PO TABS
650.0000 mg | ORAL_TABLET | Freq: Four times a day (QID) | ORAL | Status: DC | PRN
  Administered 2016-11-29: 650 mg via ORAL
  Filled 2016-11-29: qty 2

## 2016-11-29 NOTE — Consult Note (Addendum)
Consult: Urinary retention Requested by; Dr. Sherley Bounds  History of Present Illness: Pt admitted s/p decompressive acdf C6-7 with evacuation of epidural abscess on 2/15 and wound exploration drainage of wound abscess 2/24. He required I&O cath for retention after the first procedure. He presented to ED 2/23 with frequency, urgency, straining and weak stream and the foley was replaced prior to the second procedure. The catheter was removed post-op but he required in and out catheterization again. This afternoon his bladder scan was over 500 mL but the patient had too much discomfort with another in and out catheterization and I recommended the Foley catheter be replaced. He has had urgency and only been voiding small amounts. He voided just before I arrived this evening 50 mL's. Urine clear. Bladder scan reading 700 ml.   The patient has a history of urinary retention and difficulty voiding after anesthesia. He said after both of his inguinal hernia repairs he had urinary retention for a time. He has a history of BPH and saw Dr. Terance Hart in 2011 for a brief rise in his PSA (5.6). On recheck the PSA returned to normal at 2.01.   Past Medical History:  Diagnosis Date  . Anemia, unspecified 09/06/2013  . CKD (chronic kidney disease)    on chronic low dose Prednisone  . HTN (hypertension)   . Hyperlipidemia   . ITP (idiopathic thrombocytopenic purpura) 2011   on chronic low dose Prednisone  . 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 ANTERIOR CERVICAL DECOMPRESSION FOR EPIDURAL ABSCESS N/A 11/17/2016   Procedure: ANTERIOR CERVICAL DECOMPRESSION FOR EPIDURAL ABSCESS CERVICAL SIX-SEVEN;  Surgeon: Eustace Moore, MD;  Location: [facility];  Service: Neurosurgery;  Laterality: N/A;  . CERVICAL WOUND DEBRIDEMENT N/A 11/26/2016   Procedure: CERVICAL WOUND EXPLORATION, IRRIGATION DEBRIDEMENT;  Surgeon: Kevan Ny Ditty, MD;  Location: [facility];  Service:  Neurosurgery;  Laterality: N/A;  . HERNIA REPAIR    . kidney biospy      Home Medications:  Prescriptions Prior to Admission  Medication Sig Dispense Refill Last Dose  . calcium carbonate (OS-CAL) 600 MG TABS Take 600 mg by mouth daily.   11/25/2016 at Unknown time  . cefTRIAXone 2 g in dextrose 5 % 50 mL Inject 2 g into the vein daily. 30 g 2 11/26/2016 at 130p  . enalapril (VASOTEC) 10 MG tablet Take 10 mg by mouth daily.    11/26/2016 at Unknown time  . oxyCODONE (OXY IR/ROXICODONE) 5 MG immediate release tablet Take 1 tablet (5 mg total) by mouth every 6 (six) hours as needed for breakthrough pain. 30 tablet 0 11/26/2016 at Unknown time  . predniSONE (DELTASONE) 5 MG tablet Take 1 tablet (5 mg total) by mouth daily. 100 tablet 0 11/26/2016 at Unknown time  . rifampin (RIFADIN) 300 MG capsule Take 1 capsule (300 mg total) by mouth 2 (two) times daily with a meal. 80 capsule 0 11/26/2016 at Unknown time  . cefTRIAXone (ROCEPHIN) IVPB Inject 2 g into the vein daily. Indication:  MSSA bacteremia and epidural abscess Last Day of Therapy: 01/01/17 Labs - Once weekly:  CBC/D and BMP, Labs - Every other week:  ESR and CRP 39 Units 0    Allergies:  Allergies  Allergen Reactions  . Lipitor [Atorvastatin] Other (See Comments)    Heart races  . Penicillins Rash and Other (See Comments)    "I break out in between my fingers, rash, hives or whatever". Tolerated Ancef and Rocephin  Family History  Problem Relation Age of Onset  . Cancer Father     prostate ca   Social History:  reports that he quit smoking about 6 years ago. He has a 30.00 pack-year smoking history. He has never used smokeless tobacco. He reports that he does not drink alcohol or use drugs.  ROS: A complete review of systems was performed.  All systems are negative except for pertinent findings as noted. Review of Systems  All other systems reviewed and are negative.    Physical Exam:  Vital signs in last 24 hours: Temp:   [97.9 F (36.6 C)-98.6 F (37 C)] 98.2 F (36.8 C) (02/27 1400) Pulse Rate:  [89-106] 104 (02/27 1400) Resp:  [13-19] 17 (02/27 1400) BP: (100-124)/(66-87) 115/76 (02/27 1400) SpO2:  [94 %-100 %] 97 % (02/27 1400) General:  Alert and oriented, No acute distress HEENT: Normocephalic, atraumatic Neck: No JVD or lymphadenopathy Cardiovascular: Regular rate and rhythm Lungs: Regular rate and effort Abdomen: Soft, nontender, nondistended, no abdominal masses Back: No CVA tenderness Extremities: No edema Neurologic: Grossly intact GU: Circumcised penis without mass or lesion.  Procedure: After verbal consent was obtained (I discussed with the patient and his sister the nature, risks benefits of Foley catheter, CIC, or no catheter with continued surveillance and retention) the penis was prepped in the usual sterile fashion. A 16 French coud catheter was advanced without difficulty and left gravity drainage. About 1000 mL was drained. Urine clear.  Laboratory Data:  Results for orders placed or performed during the hospital encounter of 11/25/16 (from the past 24 hour(s))  Basic metabolic panel     Status: Abnormal   Collection Time: 11/29/16  5:43 AM  Result Value Ref Range   Sodium 136 135 - 145 mmol/L   Potassium 4.6 3.5 - 5.1 mmol/L   Chloride 100 (L) 101 - 111 mmol/L   CO2 26 22 - 32 mmol/L   Glucose, Bld 110 (H) 65 - 99 mg/dL   BUN 21 (H) 6 - 20 mg/dL   Creatinine, Ser 1.22 0.61 - 1.24 mg/dL   Calcium 10.0 8.9 - 10.3 mg/dL   GFR calc non Af Amer >60 >60 mL/min   GFR calc Af Amer >60 >60 mL/min   Anion gap 10 5 - 15  CBC     Status: Abnormal   Collection Time: 11/29/16  5:43 AM  Result Value Ref Range   WBC 9.4 4.0 - 10.5 K/uL   RBC 3.66 (L) 4.22 - 5.81 MIL/uL   Hemoglobin 10.7 (L) 13.0 - 17.0 g/dL   HCT 33.0 (L) 39.0 - 52.0 %   MCV 90.2 78.0 - 100.0 fL   MCH 29.2 26.0 - 34.0 pg   MCHC 32.4 30.0 - 36.0 g/dL   RDW 13.5 11.5 - 15.5 %   Platelets 338 150 - 400 K/uL    Recent Results (from the past 240 hour(s))  Culture, blood (Routine X 2) w Reflex to ID Panel     Status: None   Collection Time: 11/20/16 10:23 AM  Result Value Ref Range Status   Specimen Description BLOOD LEFT ANTECUBITAL  Final   Special Requests BOTTLES DRAWN AEROBIC AND ANAEROBIC 5CC EACH  Final   Culture NO GROWTH 5 DAYS  Final   Report Status 11/25/2016 FINAL  Final  Culture, blood (Routine X 2) w Reflex to ID Panel     Status: None   Collection Time: 11/20/16 10:29 AM  Result Value Ref Range Status   Specimen  Description BLOOD RIGHT ANTECUBITAL  Final   Special Requests BOTTLES DRAWN AEROBIC AND ANAEROBIC 5CC EACH  Final   Culture NO GROWTH 5 DAYS  Final   Report Status 11/25/2016 FINAL  Final  Urine culture     Status: None   Collection Time: 11/25/16 11:04 PM  Result Value Ref Range Status   Specimen Description URINE, CLEAN CATCH  Final   Special Requests NONE  Final   Culture NO GROWTH  Final   Report Status 11/27/2016 FINAL  Final  Culture, blood (Routine X 2) w Reflex to ID Panel     Status: None (Preliminary result)   Collection Time: 11/26/16 12:20 AM  Result Value Ref Range Status   Specimen Description BLOOD RIGHT HAND  Final   Special Requests BOTTLES DRAWN AEROBIC AND ANAEROBIC  Final   Culture NO GROWTH 3 DAYS  Final   Report Status PENDING  Incomplete  Culture, blood (Routine X 2) w Reflex to ID Panel     Status: Abnormal   Collection Time: 11/26/16 12:38 AM  Result Value Ref Range Status   Specimen Description BLOOD LEFT HAND  Final   Special Requests IN PEDIATRIC BOTTLE  Final   Culture  Setup Time   Final    GRAM POSITIVE COCCI IN CLUSTERS AEROBIC BOTTLE ONLY CRITICAL RESULT CALLED TO, READ BACK BY AND VERIFIED WITH: V.BRYK,PHARMD AT 2527 ON 11/27/16 BY G.MCADOO    Culture STAPHYLOCOCCUS AUREUS (A)  Final   Report Status 11/29/2016 FINAL  Final   Organism ID, Bacteria STAPHYLOCOCCUS AUREUS  Final      Susceptibility   Staphylococcus  aureus - MIC*    CIPROFLOXACIN <=0.5 SENSITIVE Sensitive     ERYTHROMYCIN >=8 RESISTANT Resistant     GENTAMICIN <=0.5 SENSITIVE Sensitive     OXACILLIN <=0.25 SENSITIVE Sensitive     TETRACYCLINE <=1 SENSITIVE Sensitive     VANCOMYCIN 1 SENSITIVE Sensitive     TRIMETH/SULFA <=10 SENSITIVE Sensitive     CLINDAMYCIN <=0.25 SENSITIVE Sensitive     RIFAMPIN <=0.5 SENSITIVE Sensitive     Inducible Clindamycin NEGATIVE Sensitive     * STAPHYLOCOCCUS AUREUS  Blood Culture ID Panel (Reflexed)     Status: Abnormal   Collection Time: 11/26/16 12:38 AM  Result Value Ref Range Status   Enterococcus species NOT DETECTED NOT DETECTED Final   Listeria monocytogenes NOT DETECTED NOT DETECTED Final   Staphylococcus species DETECTED (A) NOT DETECTED Final    Comment: CRITICAL RESULT CALLED TO, READ BACK BY AND VERIFIED WITH: V.BRYK,PHARMD AT 6195 ON 11/27/16 BY G.MCADOO    Staphylococcus aureus DETECTED (A) NOT DETECTED Final    Comment: Methicillin (oxacillin) susceptible Staphylococcus aureus (MSSA). Preferred therapy is anti staphylococcal beta lactam antibiotic (Cefazolin or Nafcillin), unless clinically contraindicated. CRITICAL RESULT CALLED TO, READ BACK BY AND VERIFIED WITH: V.BRYK,PHARMD AT 9362 ON 11/27/16 BY G.MCADOO    Methicillin resistance NOT DETECTED NOT DETECTED Final   Streptococcus species NOT DETECTED NOT DETECTED Final   Streptococcus agalactiae NOT DETECTED NOT DETECTED Final   Streptococcus pneumoniae NOT DETECTED NOT DETECTED Final   Streptococcus pyogenes NOT DETECTED NOT DETECTED Final   Acinetobacter baumannii NOT DETECTED NOT DETECTED Final   Enterobacteriaceae species NOT DETECTED NOT DETECTED Final   Enterobacter cloacae complex NOT DETECTED NOT DETECTED Final   Escherichia coli NOT DETECTED NOT DETECTED Final   Klebsiella oxytoca NOT DETECTED NOT DETECTED Final   Klebsiella pneumoniae NOT DETECTED NOT DETECTED Final   Proteus species NOT DETECTED  NOT DETECTED Final    Serratia marcescens NOT DETECTED NOT DETECTED Final   Haemophilus influenzae NOT DETECTED NOT DETECTED Final   Neisseria meningitidis NOT DETECTED NOT DETECTED Final   Pseudomonas aeruginosa NOT DETECTED NOT DETECTED Final   Candida albicans NOT DETECTED NOT DETECTED Final   Candida glabrata NOT DETECTED NOT DETECTED Final   Candida krusei NOT DETECTED NOT DETECTED Final   Candida parapsilosis NOT DETECTED NOT DETECTED Final   Candida tropicalis NOT DETECTED NOT DETECTED Final  Aerobic/Anaerobic Culture (surgical/deep wound)     Status: None (Preliminary result)   Collection Time: 11/26/16 11:00 AM  Result Value Ref Range Status   Specimen Description WOUND SPEC A  Final   Special Requests SUPERFICIAL CERVICAL WOUND  Final   Gram Stain NO WBC SEEN RARE GRAM POSITIVE COCCI IN PAIRS   Final   Culture   Final    RARE STAPHYLOCOCCUS AUREUS SUSCEPTIBILITIES PERFORMED ON PREVIOUS CULTURE WITHIN THE LAST 5 DAYS. NO ANAEROBES ISOLATED; CULTURE IN PROGRESS FOR 5 DAYS    Report Status PENDING  Incomplete  Aerobic/Anaerobic Culture (surgical/deep wound)     Status: None (Preliminary result)   Collection Time: 11/26/16 11:02 AM  Result Value Ref Range Status   Specimen Description WOUND SPEC B  Final   Special Requests DEEP CERVICAL WOUND  Final   Gram Stain NO WBC SEEN RARE GRAM POSITIVE COCCI IN PAIRS   Final   Culture   Final    RARE STAPHYLOCOCCUS AUREUS NO ANAEROBES ISOLATED; CULTURE IN PROGRESS FOR 5 DAYS    Report Status PENDING  Incomplete   Organism ID, Bacteria STAPHYLOCOCCUS AUREUS  Final      Susceptibility   Staphylococcus aureus - MIC*    CIPROFLOXACIN <=0.5 SENSITIVE Sensitive     ERYTHROMYCIN >=8 RESISTANT Resistant     GENTAMICIN <=0.5 SENSITIVE Sensitive     OXACILLIN <=0.25 SENSITIVE Sensitive     TETRACYCLINE <=1 SENSITIVE Sensitive     VANCOMYCIN 1 SENSITIVE Sensitive     TRIMETH/SULFA <=10 SENSITIVE Sensitive     CLINDAMYCIN <=0.25 SENSITIVE Sensitive      RIFAMPIN <=0.5 SENSITIVE Sensitive     Inducible Clindamycin NEGATIVE Sensitive     * RARE STAPHYLOCOCCUS AUREUS   Creatinine:  Recent Labs  11/26/16 0022 11/29/16 0543  CREATININE 1.09 1.22    Impression/Assessment/plan:  -BPH-continue tamsulosin  -Urinary retention - It's going to take some time for his bladder to recover and I recommend leaving the Foley in for about a week. The rifampin and cefazolin will cover the urine and any catheter colonization. D/c home with foley to gravity.   I left contact info on chart and pt and sister are well known to our practice. I also sent a message to my office to arrange f/u next week.   Thomas Nielsen 11/29/2016, 5:49 PM

## 2016-11-29 NOTE — Progress Notes (Signed)
Patient ID: Thomas Nielsen, male   DOB: [birth year], 61 y.o.   MRN: [id number] Subjective: Patient reports no pain, no NTW  Objective: Vital signs in last 24 hours: Temp:  [97.9 F (36.6 C)-98.2 F (36.8 C)] 98.2 F (36.8 C) (02/27 0540) Pulse Rate:  [99-106] 106 (02/27 0540) Resp:  [17-18] 18 (02/27 0540) BP: (100-124)/(71-78) 109/74 (02/27 0540) SpO2:  [96 %-98 %] 96 % (02/27 0540)  Intake/Output from previous day: 02/26 0701 - 02/27 0700 In: 610 [P.O.:480; I.V.:30; IV Piggyback:100] Out: 3790 [Urine:3790] Intake/Output this shift: No intake/output data recorded.  Neurologic: Grossly normal  Lab Results: Lab Results  Component Value Date   WBC 11.2 (H) 11/26/2016   HGB 11.2 (L) 11/26/2016   HCT 32.0 (L) 11/26/2016   MCV 89.1 11/26/2016   PLT 469 (H) 11/26/2016   No results found for: INR, PROTIME BMET Lab Results  Component Value Date   NA 136 11/29/2016   K 4.6 11/29/2016   CL 100 (L) 11/29/2016   CO2 26 11/29/2016   GLUCOSE 110 (H) 11/29/2016   BUN 21 (H) 11/29/2016   CREATININE 1.22 11/29/2016   CALCIUM 10.0 11/29/2016    Studies/Results: No results found.  Assessment/Plan: Overall doing well, TEE today, continue ABX per ID, blood cxs repeated  I/O cath prn, continue flomax   LOS: 3 days    Thomas Nielsen S 11/29/2016, 8:20 AM

## 2016-11-29 NOTE — Progress Notes (Addendum)
[facility] for Infectious Disease    Date of Admission:  11/25/2016   Total days of antibiotics         Day 4 cefazolin        Day 4 rif           ID: Thomas Nielsen is a 61 y.o. male with He has a wound infection following recent cervical spinal epidural abscess s/p ACDF and evacuation done 2/15 and blood cultures and wound with MSSA, previously on ceftriaxone and rifampin and now with wound infection.  Principal Problem:   Abscess in epidural space of cervical spine    Subjective: Afebrile; still complaining of difficulty with voiding last night. He required I x O x 2 last night.   picc line pulled yesterday. underwent TEE this morning that did not show any valvular vegetations  Medications:  reviewed  Objective: Vital signs in last 24 hours: Temp:  [97.9 F (36.6 C)-98.6 F (37 C)] 98.2 F (36.8 C) (02/27 1400) Pulse Rate:  [89-106] 104 (02/27 1400) Resp:  [13-19] 17 (02/27 1400) BP: (100-124)/(66-87) 115/76 (02/27 1400) SpO2:  [94 %-100 %] 97 % (02/27 1400) Physical Exam  Constitutional: He is oriented to person, place, and time. He appears well-developed and well-nourished. No distress.  HENT:  Mouth/Throat: Oropharynx is clear and moist. No oropharyngeal exudate.  Cardiovascular: Normal rate, regular rhythm and normal heart sounds. Exam reveals no gallop and no friction rub.  No murmur heard.  Pulmonary/Chest: Effort normal and breath sounds normal. No respiratory distress. He has no wheezes.  Neck= serosanginous fluid in bulb draining anterior neck incision Abdominal: Soft. Bowel sounds are normal. He exhibits no distension. There is no tenderness.  Lymphadenopathy:  He has no cervical adenopathy.  Neurological: He is alert and oriented to person, place, and time.  Skin: Skin is warm and dry. No rash noted. No erythema.  Ext: bandaged arm Psychiatric: He has a normal mood and affect. His behavior is normal.     Lab Results  Recent Labs   11/29/16 0543  WBC 9.4  HGB 10.7*  HCT 33.0*  NA 136  K 4.6  CL 100*  CO2 26  BUN 21*  CREATININE 1.22   Lab Results  Component Value Date   ESRSEDRATE 94 (H) 11/18/2016   Lab Results  Component Value Date   CRP 32.6 (H) 11/18/2016    Microbiology: 2/24 blood cx 1 of 4 bottles + MSSA 2/24 cervical sup cx MSSA 2/24 cervical deep cx MSSA 2/27 blood cx pending Studies/Results: TTE on 2/26: Left ventricle: The cavity size was normal. Systolic function was   normal. The estimated ejection fraction was in the range of 55%   to 60%. Wall motion was normal; there were no regional wall   motion abnormalities. The study is not technically sufficient to   allow evaluation of LV diastolic function. - Mitral valve: Calcified annulus. - Right ventricle: Basal short axis view shows mobile struture that   is likely trabeculation in RVOT can consider f/u TEE if   clinically indicated. - Atrial septum: No defect or patent foramen ovale was identified.  Assessment/Plan:  Cervical epidural abscess c/b MSSA bacteremia s/p I x D and C6-7 ACDF c/b post-operative wound infection POD# 3 for wound exploration and drainage on 2/24 = continue on cefazolin plus rifampin = TEE is negative = repeat blood cx  Have been drawn this morning. If they remain negative, can get new picc line Thursday afternoon/friday = plan  to treat for 8 wk  Urinary obstruction = continue with flomax and bladder scan. Urology consultation pending per patient's call  Baxter Flattery [facility] for Infectious Diseases Cell: [phone] Pager: [phone]  11/29/2016, 2:09 PM

## 2016-11-29 NOTE — Progress Notes (Signed)
RN attempted to IN & Out cath for a bladder scan volume of 500. IN & Out cath was not successful due to resistance upon entering. PT complained of a lot of discomfort. MD page notified.

## 2016-11-29 NOTE — Progress Notes (Signed)
Urology Paged regarding pt's situation of not being able to void and In & out cath not working; and pt and family awaiting to see urology. Recommendations were made, but orders were not put in.

## 2016-11-29 NOTE — Discharge Instructions (Signed)
Indwelling Urinary Catheter Care, Adult °Take good care of your catheter to keep it working and to prevent problems. °How to wear your catheter °Attach your catheter to your leg with tape (adhesive tape) or a leg strap. Make sure it is not too tight. If you use tape, remove any bits of tape that are already on the catheter. °How to wear a drainage bag °You should have: °· A large overnight bag. °· A small leg bag. °Overnight Bag  °You may wear the overnight bag at any time. Always keep the bag below the level of your bladder but off the floor. When you sleep, put a clean plastic bag in a wastebasket. Then hang the bag inside the wastebasket. °Leg Bag  °Never wear the leg bag at night. Always wear the leg bag below your knee. Keep the leg bag secure with a leg strap or tape. °How to care for your skin °· Clean the skin around the catheter at least once every day. °· Shower every day. Do not take baths. °· Put creams, lotions, or ointments on your genital area only as told by your doctor. °· Do not use powders, sprays, or lotions on your genital area. °How to clean your catheter and your skin °1. Wash your hands with soap and water. °2. Wet a washcloth in warm water and gentle (mild) soap. °3. Use the washcloth to clean the skin where the catheter enters your body. Clean downward and wipe away from the catheter in small circles. Do not wipe toward the catheter. °4. Pat the area dry with a clean towel. Make sure to clean off all soap. °How to care for your drainage bags °Empty your drainage bag when it is ?-½ full or at least 2-3 times a day. Replace your drainage bag once a month or sooner if it starts to smell bad or look dirty. Do not clean your drainage bag unless told by your doctor. °Emptying a drainage bag  ° °Supplies Needed °· Rubbing alcohol. °· Gauze pad or cotton ball. °· Tape or a leg strap. °Steps °1. Wash your hands with soap and water. °2. Separate (detach) the bag from your leg. °3. Hold the bag over  the toilet or a clean container. Keep the bag below your hips and bladder. This stops pee (urine) from going back into the tube. °4. Open the pour spout at the bottom of the bag. °5. Empty the pee into the toilet or container. Do not let the pour spout touch any surface. °6. Put rubbing alcohol on a gauze pad or cotton ball. °7. Use the gauze pad or cotton ball to clean the pour spout. °8. Close the pour spout. °9. Attach the bag to your leg with tape or a leg strap. °10. Wash your hands. °Changing a drainage bag  °Supplies Needed °· Alcohol wipes. °· A clean drainage bag. °· Adhesive tape or a leg strap. °Steps °1. Wash your hands with soap and water. °2. Separate the dirty bag from your leg. °3. Pinch the rubber catheter with your fingers so that pee does not spill out. °4. Separate the catheter tube from the drainage tube where these tubes connect (at the connection valve). Do not let the tubes touch any surface. °5. Clean the end of the catheter tube with an alcohol wipe. Use a different alcohol wipe to clean the end of the drainage tube. °6. Connect the catheter tube to the drainage tube of the clean bag. °7. Attach the new bag to   the leg with adhesive tape or a leg strap. °8. Wash your hands. °How to prevent infection and other problems °· Never pull on your catheter or try to remove it. Pulling can damage tissue in your body. °· Always wash your hands before and after touching your catheter. °· If a leg strap gets wet, replace it with a dry one. °· Drink enough fluids to keep your pee clear or pale yellow, or as told by your doctor. °· Do not let the drainage bag or tubing touch the floor. °· Wear cotton underwear. °· If you are male, wipe from front to back after you poop (have a bowel movement). °· Check on the catheter often to make sure it works and the tubing is not twisted. °Get help if: °· Your pee is cloudy. °· Your pee smells unusually bad. °· Your pee is not draining into the bag. °· Your tube  gets clogged. °· Your catheter starts to leak. °· Your bladder feels full. °Get help right away if: °· You have redness, swelling, or pain where the catheter enters your body. °· You have fluid, pus, or a bad smell coming from the area where the catheter enters your body. °· The area where the catheter enters your body feels warm. °· You have a fever. °· You have pain in your: °¨ Stomach (abdomen). °¨ Legs. °¨ Lower back. °¨ Bladder. °· You see blood fill the catheter. °· Your pee is pink or red. °· You feel sick to your stomach (nauseous). °· You throw up (vomit). °· You have chills. °· Your catheter gets pulled out. °This information is not intended to replace advice given to you by your health care provider. Make sure you discuss any questions you have with your health care provider. °Document Released: 01/14/2013 Document Revised: 08/17/2016 Document Reviewed: 03/04/2014 °[employer] Interactive Patient Education © 2017 [employer] ° °

## 2016-11-29 NOTE — H&P (Signed)
Patient ID: DERL ABALOS MRN: [id number] DOB/AGE: 05-16-1956 61 y.o.  Admit date: 11/25/2016  HPI: Mr. Ground is a 61M with CKD, hypertension, hyperlipidemia and ITP here with a cervical spinal epidural abscess after ACDF.  Wound and blood cultures grew MSSA.  TTE revealed LVEF 55-60% and mobile structure in RVOT thought to be trabeculation.  Cardiology was asked to perform TEE to evaluate for endocarditis.    Past Medical History:  Diagnosis Date  . Anemia, unspecified 09/06/2013  . CKD (chronic kidney disease)    on chronic low dose Prednisone  . HTN (hypertension)   . Hyperlipidemia   . ITP (idiopathic thrombocytopenic purpura) 2011   on chronic low dose Prednisone  . 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 calcium carbonate (OS-CAL) 600 MG TABS Take 600 mg by mouth daily.    . cefTRIAXone 2 g in dextrose 5 % 50 mL Inject 2 g into the vein daily. 30 g 2  . enalapril (VASOTEC) 10 MG tablet Take 10 mg by mouth daily.     Marland Kitchen oxyCODONE (OXY IR/ROXICODONE) 5 MG immediate release tablet Take 1 tablet (5 mg total) by mouth every 6 (six) hours as needed for breakthrough pain. 30 tablet 0  . predniSONE (DELTASONE) 5 MG tablet Take 1 tablet (5 mg total) by mouth daily. 100 tablet 0  . rifampin (RIFADIN) 300 MG capsule Take 1 capsule (300 mg total) by mouth 2 (two) times daily with a meal. 80 capsule 0  . cefTRIAXone (ROCEPHIN) IVPB Inject 2 g into the vein daily. Indication:  MSSA bacteremia and epidural abscess Last Day of Therapy: 01/01/17 Labs - Once weekly:  CBC/D and BMP, Labs - Every other week:  ESR and CRP 39 Units 0     Allergies  Allergen Reactions  . Lipitor [Atorvastatin] Other (See Comments)    Heart races  . Penicillins Rash and Other (See Comments)    "I break out in between my fingers, rash, hives or whatever"    Social History   Social History  . Marital status: Single    Spouse name: N/A   . Number of children: N/A  . Years of education: N/A   Occupational History  . Not on file.   Social History Main Topics  . Smoking status: Former Smoker    Packs/day: 1.00    Years: 30.00    Quit date: 09/06/2010  . Smokeless tobacco: Never Used  . Alcohol use No  . Drug use: No  . Sexual activity: Not on file   Other Topics Concern  . Not on file   Social History Narrative  . No narrative on file    Family History  Problem Relation Age of Onset  . Cancer Father     prostate ca    PHYSICAL EXAM: Vitals:   11/29/16 0104 11/29/16 0540  BP: 124/78 109/74  Pulse: (!) 101 (!) 106  Resp: 18 18  Temp: 97.9 F (36.6 C) 98.2 F (36.8 C)   General:  Well appearing. No respiratory difficulty HEENT: normal Neck: supple. no JVD. Carotids 2+ bilat; no bruits. No lymphadenopathy or thryomegaly appreciated.  Drain in place at R neck Cor: PMI nondisplaced. Regular rate & rhythm. No rubs, gallops or murmurs. Lungs: clear Abdomen: soft, nontender, nondistended. No hepatosplenomegaly. No bruits or masses. Good bowel sounds. Extremities: no cyanosis, clubbing, rash, edema Neuro: alert & oriented x 3, cranial nerves grossly intact. moves all  4 extremities w/o difficulty. Affect pleasant.   Results for orders placed or performed during the hospital encounter of 11/25/16 (from the past 24 hour(s))  Basic metabolic panel     Status: Abnormal   Collection Time: 11/29/16  5:43 AM  Result Value Ref Range   Sodium 136 135 - 145 mmol/L   Potassium 4.6 3.5 - 5.1 mmol/L   Chloride 100 (L) 101 - 111 mmol/L   CO2 26 22 - 32 mmol/L   Glucose, Bld 110 (H) 65 - 99 mg/dL   BUN 21 (H) 6 - 20 mg/dL   Creatinine, Ser 1.22 0.61 - 1.24 mg/dL   Calcium 10.0 8.9 - 10.3 mg/dL   GFR calc non Af Amer >60 >60 mL/min   GFR calc Af Amer >60 >60 mL/min   Anion gap 10 5 - 15   No results found.    ASSESSMENT/PLAN:  STILES MAXCY is a 61 y.o. male who has presented today for surgery, with the  diagnosis of bacteremia. The various methods of treatment have been discussed with the patient and family. After consideration of risks, benefits and other options for treatment, the patient has consented to Procedure(s): TRANSESOPHAGEAL ECHOCARDIOGRAM (TEE) (N/A) as a surgical intervention . The patient's history has been reviewed, patient examined, no change in status, stable for surgery. I have reviewed the patient's chart and labs. Questions were answered to the patient's satisfaction.     Signed: Zanasia Hickson C. Oval Linsey, MD, [facility]  11/29/2016, 8:12 AM

## 2016-11-29 NOTE — CV Procedure (Signed)
Brief TEE Note  LVEF 60-65% Mild MR, trivial AR, trivial PR No LA/LAA thrombus or mass No vegetations  During this procedure the patient is administered a total of Versed 2 mg and Fentanyl 25 mcg to achieve and maintain moderate conscious sedation.  The patient's heart rate, blood pressure, and oxygen saturation are monitored continuously during the procedure. The period of conscious sedation is 19 minutes, of which I was present face-to-face 100% of this time.   For additional details see full report.  Tyrome Donatelli C. Oval Linsey, MD, [facility]  11/29/2016  9:38 AM

## 2016-11-30 ENCOUNTER — Encounter (HOSPITAL_COMMUNITY): Payer: Self-pay | Admitting: Cardiovascular Disease

## 2016-11-30 MED ORDER — CEFAZOLIN SODIUM-DEXTROSE 2-4 GM/100ML-% IV SOLN
2.0000 g | Freq: Three times a day (TID) | INTRAVENOUS | Status: DC
  Administered 2016-11-30 – 2016-12-01 (×5): 2 g via INTRAVENOUS
  Filled 2016-11-30 (×7): qty 100

## 2016-11-30 NOTE — Progress Notes (Addendum)
[facility] for Infectious Disease    Date of Admission:  11/25/2016   Total days of antibiotics         Day 5 cefazolin        Day 5 rif           ID: Thomas Nielsen is a 61 y.o. male with He has a wound infection following recent cervical spinal epidural abscess s/p ACDF and evacuation done 2/15 and blood cultures and wound with MSSA, previously on ceftriaxone and rifampin and now with wound infection.  Principal Problem:   Abscess in epidural space of cervical spine    Subjective: Afebrile; drained pulled this morning. Seen by urology who recommended foley  Medications:  reviewed  Objective: Vital signs in last 24 hours: Temp:  [97.6 F (36.4 C)-98.5 F (36.9 C)] 98.4 F (36.9 C) (02/28 0939) Pulse Rate:  [98-114] 110 (02/28 0939) Resp:  [16-18] 18 (02/28 0939) BP: (107-129)/(60-87) 113/69 (02/28 0939) SpO2:  [97 %-100 %] 97 % (02/28 0939) Physical Exam  Constitutional: He is oriented to person, place, and time. He appears well-developed and well-nourished. No distress.  HENT:  Mouth/Throat: Oropharynx is clear and moist. No oropharyngeal exudate.  Cardiovascular: Normal rate, regular rhythm and normal heart sounds. Exam reveals no gallop and no friction rub.  No murmur heard.  Pulmonary/Chest: Effort normal and breath sounds normal. No respiratory distress. He has no wheezes.  Neck= incision and sutures are c/d/i. Mild erythema at distal incision which drain had previously been. No drainage Abdominal: Soft. Bowel sounds are normal. He exhibits no distension. There is no tenderness. .  Neurological: He is alert and oriented to person, place, and time.  Skin: Skin is warm and dry. No rash noted. No erythema.  Ext: bandaged arm Psychiatric: He has a normal mood and affect. His behavior is normal.     Lab Results  Recent Labs  11/29/16 0543  WBC 9.4  HGB 10.7*  HCT 33.0*  NA 136  K 4.6  CL 100*  CO2 26  BUN 21*  CREATININE 1.22   Lab Results    Component Value Date   ESRSEDRATE 94 (H) 11/18/2016   Lab Results  Component Value Date   CRP 32.6 (H) 11/18/2016    Microbiology: 2/24 blood cx 1 of 4 bottles + MSSA 2/24 cervical sup cx MSSA 2/24 cervical deep cx MSSA 2/27 blood cx pending Studies/Results: TTE on 2/26: Left ventricle: The cavity size was normal. Systolic function was   normal. The estimated ejection fraction was in the range of 55%   to 60%. Wall motion was normal; there were no regional wall   motion abnormalities. The study is not technically sufficient to   allow evaluation of LV diastolic function. - Mitral valve: Calcified annulus. - Right ventricle: Basal short axis view shows mobile struture that   is likely trabeculation in RVOT can consider f/u TEE if   clinically indicated. - Atrial septum: No defect or patent foramen ovale was identified.  Assessment/Plan:  Cervical epidural abscess c/b MSSA bacteremia s/p I x D and C6-7 ACDF c/b post-operative wound infection s/p wound exploration and drainage on 2/24  = continue on cefazolin plus rifampin = if blood cx remain negative on 2/28, can get new picc line Thursday afternoon = plan to treat for 8 wk, using 2/25 as day 1 - he will need rx for rifampin '300mg'$  PO BID #60 x 2 months - I will place picc line order for  placement tomorrow  - I have contacted advance home health coordinator  Urinary obstruction = seen by urology, doing a short trial of foley catheter til next week.  Home health orders listed below  Diagnosis: mssa  Culture Result: mssa epidural abscess and bacteremia  Allergies  Allergen Reactions  . Lipitor [Atorvastatin] Other (See Comments)    Heart races  . Penicillins Rash and Other (See Comments)    "I break out in between my fingers, rash, hives or whatever". Tolerated Ancef and Rocephin    Discharge antibiotics: Per pharmacy protocol cefazolin 2gm IV Q 8hr  Duration: 8 wk End Date: April 22  [place] Per  Protocol:  Labs weekly while on IV antibiotics: _x_ CBC with differential _x_ CMP  _x_ CRP _x_ ESR    _x_ Please leave PIC in place until doctor has seen patient or been notified  Fax weekly labs to [phone]  Clinic Follow Up Appt: 3-4 wk  @   [facility] [place], [facility] for Infectious Diseases Cell: [phone] Pager: [phone]  11/30/2016, 4:24 PM

## 2016-11-30 NOTE — Progress Notes (Signed)
Patient ID: Thomas Nielsen, male   DOB: [birth year], 61 y.o.   MRN: [id number] Subjective: Patient reports he is doing well. Denies pain. Denies fevers or chills.  Objective: Vital signs in last 24 hours: Temp:  [97.6 F (36.4 C)-98.5 F (36.9 C)] 98.4 F (36.9 C) (02/28 0939) Pulse Rate:  [98-114] 110 (02/28 0939) Resp:  [16-18] 18 (02/28 0939) BP: (107-129)/(60-87) 113/69 (02/28 0939) SpO2:  [97 %-100 %] 97 % (02/28 0939)  Intake/Output from previous day: 02/27 0701 - 02/28 0700 In: 50 [IV Piggyback:50] Out: 2100 [Urine:2100] Intake/Output this shift: No intake/output data recorded.  Neurologic: Grossly normal, wound clean dry and intact, JP drain removed  Lab Results: Lab Results  Component Value Date   WBC 9.4 11/29/2016   HGB 10.7 (L) 11/29/2016   HCT 33.0 (L) 11/29/2016   MCV 90.2 11/29/2016   PLT 338 11/29/2016   No results found for: INR, PROTIME BMET Lab Results  Component Value Date   NA 136 11/29/2016   K 4.6 11/29/2016   CL 100 (L) 11/29/2016   CO2 26 11/29/2016   GLUCOSE 110 (H) 11/29/2016   BUN 21 (H) 11/29/2016   CREATININE 1.22 11/29/2016   CALCIUM 10.0 11/29/2016    Studies/Results: No results found.  Assessment/Plan: Doing well. Continue antibiotics per infectious disease. Switched to Rocephin back to cefazolin   LOS: 4 days    Fabiola Mudgett S 11/30/2016, 1:39 PM

## 2016-12-01 LAB — CULTURE, BLOOD (ROUTINE X 2): Culture: NO GROWTH

## 2016-12-01 LAB — AEROBIC/ANAEROBIC CULTURE (SURGICAL/DEEP WOUND): GRAM STAIN: NONE SEEN

## 2016-12-01 LAB — AEROBIC/ANAEROBIC CULTURE W GRAM STAIN (SURGICAL/DEEP WOUND): Gram Stain: NONE SEEN

## 2016-12-01 MED ORDER — HEPARIN SOD (PORK) LOCK FLUSH 100 UNIT/ML IV SOLN
250.0000 [IU] | INTRAVENOUS | Status: AC | PRN
  Administered 2016-12-01: 250 [IU]

## 2016-12-01 MED ORDER — TAMSULOSIN HCL 0.4 MG PO CAPS: 0.4000 mg | ORAL_CAPSULE | Freq: Every day | ORAL | 0 refills | Status: DC

## 2016-12-01 MED ORDER — CEFAZOLIN IV (FOR PTA / DISCHARGE USE ONLY): 2.0000 g | Freq: Three times a day (TID) | INTRAVENOUS | 0 refills | Status: DC

## 2016-12-01 MED ORDER — SODIUM CHLORIDE 0.9% FLUSH: 10.0000 mL | INTRAVENOUS | Status: DC | PRN

## 2016-12-01 MED ORDER — CEFAZOLIN SODIUM-DEXTROSE 2-4 GM/100ML-% IV SOLN: 2.0000 g | Freq: Three times a day (TID) | INTRAVENOUS | 2 refills | Status: DC

## 2016-12-01 MED ORDER — RIFAMPIN 300 MG PO CAPS: 300.0000 mg | ORAL_CAPSULE | Freq: Two times a day (BID) | ORAL | 0 refills | Status: DC

## 2016-12-01 NOTE — Progress Notes (Signed)
PHARMACY CONSULT NOTE FOR:  OUTPATIENT  PARENTERAL ANTIBIOTIC THERAPY (OPAT)  Indication: MSSA bacteremia - epidural abscess Regimen: Cefazolin 2g IV q8h x 8 weeks End date: 01/22/18  IV antibiotic discharge orders are pended. To discharging provider:  please sign these orders via discharge navigator,  Select New Orders & click on the button choice - Manage This Unsigned Work.     Thank you for allowing pharmacy to be a part of this patient's care.  Candie Mile 12/01/2016, 9:28 AM

## 2016-12-01 NOTE — Care Management Note (Signed)
Case Management Note  Patient Details  Name: DNAIEL DEHRING MRN: [id number] Date of Birth: 1956-06-03  Subjective/Objective:                    Action/Plan: Patient discharging home today with resumption of services with [facility] for IV antibiotic therapy. Pam and Santiago Glad with [facility] aware. Pt going home with foley and HH RN will follow.  Pt states he has transportation home.   Expected Discharge Date:  12/01/16               Expected Discharge Plan:  [place]  In-House Referral:     Discharge planning Services  CM Consult  Post Acute Care Choice:  Home Health Choice offered to:  Patient  DME Arranged:    DME Agency:     HH Arranged:  RN [place] Agency:  [place]  Status of Service:  Completed, signed off  If discussed at [place] of Stay Meetings, dates discussed:    Additional Comments:  Pollie Friar, RN 12/01/2016, 2:37 PM

## 2016-12-01 NOTE — Progress Notes (Signed)
Peripherally Inserted Central Catheter/Midline Placement  The IV Nurse has discussed with the patient and/or persons authorized to consent for the patient, the purpose of this procedure and the potential benefits and risks involved with this procedure.  The benefits include less needle sticks, lab draws from the catheter, and the patient may be discharged home with the catheter. Risks include, but not limited to, infection, bleeding, blood clot (thrombus formation), and puncture of an artery; nerve damage and irregular heartbeat and possibility to perform a PICC exchange if needed/ordered by physician.  Alternatives to this procedure were also discussed.  B[employer] PICC patient education guide, fact sheet on infection prevention and patient information card has been provided to patient /or left at bedside.    PICC/Midline Placement Documentation        Thomas Nielsen 12/01/2016, 3:34 PM

## 2016-12-01 NOTE — Progress Notes (Signed)
Pt discharge education and instructions completed with pt and family at bedside; all voices understanding and denies any questions. Pt neck incision dermabond clean, dry and intact with no drainage or active bleeding noted. Pt PICC intact to left upper arm capped. Pt discharge home with family to transport him home. Foley intact and care education completed with pt and family. Pt transported off unit via wheelchair with belongings and family to the side. Delia Heady RN

## 2016-12-01 NOTE — Discharge Summary (Signed)
Physician Discharge Summary  Patient ID: Thomas Nielsen MRN: [id number] DOB/AGE: 1956-08-15 61 y.o.  Admit date: 11/25/2016 Discharge date: 12/01/2016  Admission Diagnoses: Urinary retention and wound infection    Discharge Diagnoses: Same   Discharged Condition: good  Hospital Course: The patient was admitted on 11/25/2016 and taken to the operating room where the patient underwent irrigation and debridement of cervical wound. Also had urinary retention requiring Foley catheter placement. He remained on cefazolin. The patient tolerated the procedure well and was taken to the recovery room and then to the floor in stable condition. The hospital course was routine. There were no complications. The wound remained clean dry and intact. Pt had appropriate neck soreness. No complaints of arm pain or new N/T/W. The patient remained afebrile with stable vital signs, and tolerated a regular diet. He underwent TEE. Infectious disease was consulted. His PICC line was removed and then replaced on the day of discharge. Urology was also consulted for his urinary retention. The patient continued to increase activities, and pain was well controlled with oral pain medications.   Consults: ID, urology  Significant Diagnostic Studies:  Results for orders placed or performed during the hospital encounter of 11/25/16  Urine culture  Result Value Ref Range   Specimen Description URINE, CLEAN CATCH    Special Requests NONE    Culture NO GROWTH    Report Status 11/27/2016 FINAL   Culture, blood (Routine X 2) w Reflex to ID Panel  Result Value Ref Range   Specimen Description BLOOD RIGHT HAND    Special Requests BOTTLES DRAWN AEROBIC AND ANAEROBIC 5ML    Culture NO GROWTH 5 DAYS    Report Status 12/01/2016 FINAL   Culture, blood (Routine X 2) w Reflex to ID Panel  Result Value Ref Range   Specimen Description BLOOD LEFT HAND    Special Requests IN PEDIATRIC BOTTLE 4ML    Culture  Setup Time      GRAM  POSITIVE COCCI IN CLUSTERS AEROBIC BOTTLE ONLY CRITICAL RESULT CALLED TO, READ BACK BY AND VERIFIED WITH: V.BRYK,PHARMD AT 6644 ON 11/27/16 BY [email or web address]    Culture STAPHYLOCOCCUS AUREUS (A)    Report Status 11/29/2016 FINAL    Organism ID, Bacteria STAPHYLOCOCCUS AUREUS       Susceptibility   Staphylococcus aureus - MIC*    CIPROFLOXACIN <=0.5 SENSITIVE Sensitive     ERYTHROMYCIN >=8 RESISTANT Resistant     GENTAMICIN <=0.5 SENSITIVE Sensitive     OXACILLIN <=0.25 SENSITIVE Sensitive     TETRACYCLINE <=1 SENSITIVE Sensitive     VANCOMYCIN 1 SENSITIVE Sensitive     TRIMETH/SULFA <=10 SENSITIVE Sensitive     CLINDAMYCIN <=0.25 SENSITIVE Sensitive     RIFAMPIN <=0.5 SENSITIVE Sensitive     Inducible Clindamycin NEGATIVE Sensitive     * STAPHYLOCOCCUS AUREUS  Aerobic/Anaerobic Culture (surgical/deep wound)  Result Value Ref Range   Specimen Description WOUND SPEC A    Special Requests SUPERFICIAL CERVICAL WOUND    Gram Stain NO WBC SEEN RARE GRAM POSITIVE COCCI IN PAIRS     Culture      RARE STAPHYLOCOCCUS AUREUS SUSCEPTIBILITIES PERFORMED ON PREVIOUS CULTURE WITHIN THE LAST 5 DAYS. NO ANAEROBES ISOLATED    Report Status 12/01/2016 FINAL   Aerobic/Anaerobic Culture (surgical/deep wound)  Result Value Ref Range   Specimen Description WOUND SPEC B    Special Requests DEEP CERVICAL WOUND    Gram Stain NO WBC SEEN RARE GRAM POSITIVE COCCI IN PAIRS     Culture  RARE STAPHYLOCOCCUS AUREUS NO ANAEROBES ISOLATED    Report Status 12/01/2016 FINAL    Organism ID, Bacteria STAPHYLOCOCCUS AUREUS       Susceptibility   Staphylococcus aureus - MIC*    CIPROFLOXACIN <=0.5 SENSITIVE Sensitive     ERYTHROMYCIN >=8 RESISTANT Resistant     GENTAMICIN <=0.5 SENSITIVE Sensitive     OXACILLIN <=0.25 SENSITIVE Sensitive     TETRACYCLINE <=1 SENSITIVE Sensitive     VANCOMYCIN 1 SENSITIVE Sensitive     TRIMETH/SULFA <=10 SENSITIVE Sensitive     CLINDAMYCIN <=0.25 SENSITIVE Sensitive      RIFAMPIN <=0.5 SENSITIVE Sensitive     Inducible Clindamycin NEGATIVE Sensitive     * RARE STAPHYLOCOCCUS AUREUS  Blood Culture ID Panel (Reflexed)  Result Value Ref Range   Enterococcus species NOT DETECTED NOT DETECTED   Listeria monocytogenes NOT DETECTED NOT DETECTED   Staphylococcus species DETECTED (A) NOT DETECTED   Staphylococcus aureus DETECTED (A) NOT DETECTED   Methicillin resistance NOT DETECTED NOT DETECTED   Streptococcus species NOT DETECTED NOT DETECTED   Streptococcus agalactiae NOT DETECTED NOT DETECTED   Streptococcus pneumoniae NOT DETECTED NOT DETECTED   Streptococcus pyogenes NOT DETECTED NOT DETECTED   Acinetobacter baumannii NOT DETECTED NOT DETECTED   Enterobacteriaceae species NOT DETECTED NOT DETECTED   Enterobacter cloacae complex NOT DETECTED NOT DETECTED   Escherichia coli NOT DETECTED NOT DETECTED   Klebsiella oxytoca NOT DETECTED NOT DETECTED   Klebsiella pneumoniae NOT DETECTED NOT DETECTED   Proteus species NOT DETECTED NOT DETECTED   Serratia marcescens NOT DETECTED NOT DETECTED   Haemophilus influenzae NOT DETECTED NOT DETECTED   Neisseria meningitidis NOT DETECTED NOT DETECTED   Pseudomonas aeruginosa NOT DETECTED NOT DETECTED   Candida albicans NOT DETECTED NOT DETECTED   Candida glabrata NOT DETECTED NOT DETECTED   Candida krusei NOT DETECTED NOT DETECTED   Candida parapsilosis NOT DETECTED NOT DETECTED   Candida tropicalis NOT DETECTED NOT DETECTED  Culture, blood (routine x 2)  Result Value Ref Range   Specimen Description BLOOD LEFT ANTECUBITAL    Special Requests BOTTLES DRAWN AEROBIC AND ANAEROBIC 10 CC    Culture NO GROWTH 2 DAYS    Report Status PENDING   Culture, blood (routine x 2)  Result Value Ref Range   Specimen Description BLOOD LEFT WRIST    Special Requests BOTTLES DRAWN AEROBIC AND ANAEROBIC 10 CC EACH    Culture NO GROWTH 2 DAYS    Report Status PENDING   Urinalysis, Routine w reflex microscopic  Result Value Ref  Range   Color, Urine YELLOW YELLOW   APPearance CLEAR CLEAR   Specific Gravity, Urine 1.011 1.005 - 1.030   pH 5.0 5.0 - 8.0   Glucose, UA NEGATIVE NEGATIVE mg/dL   Hgb urine dipstick NEGATIVE NEGATIVE   Bilirubin Urine NEGATIVE NEGATIVE   Ketones, ur NEGATIVE NEGATIVE mg/dL   Protein, ur NEGATIVE NEGATIVE mg/dL   Nitrite NEGATIVE NEGATIVE   Leukocytes, UA NEGATIVE NEGATIVE  CBC with Differential/Platelet  Result Value Ref Range   WBC 11.2 (H) 4.0 - 10.5 K/uL   RBC 3.59 (L) 4.22 - 5.81 MIL/uL   Hemoglobin 11.2 (L) 13.0 - 17.0 g/dL   HCT 32.0 (L) 39.0 - 52.0 %   MCV 89.1 78.0 - 100.0 fL   MCH 31.2 26.0 - 34.0 pg   MCHC 35.0 30.0 - 36.0 g/dL   RDW 14.0 11.5 - 15.5 %   Platelets 469 (H) 150 - 400 K/uL   Neutrophils Relative %  82 %   Neutro Abs 9.2 (H) 1.7 - 7.7 K/uL   Lymphocytes Relative 9 %   Lymphs Abs 1.0 0.7 - 4.0 K/uL   Monocytes Relative 8 %   Monocytes Absolute 0.9 0.1 - 1.0 K/uL   Eosinophils Relative 1 %   Eosinophils Absolute 0.1 0.0 - 0.7 K/uL   Basophils Relative 0 %   Basophils Absolute 0.0 0.0 - 0.1 K/uL  Basic metabolic panel  Result Value Ref Range   Sodium 135 135 - 145 mmol/L   Potassium 4.4 3.5 - 5.1 mmol/L   Chloride 101 101 - 111 mmol/L   CO2 21 (L) 22 - 32 mmol/L   Glucose, Bld 114 (H) 65 - 99 mg/dL   BUN 27 (H) 6 - 20 mg/dL   Creatinine, Ser 1.09 0.61 - 1.24 mg/dL   Calcium 9.5 8.9 - 10.3 mg/dL   GFR calc non Af Amer >60 >60 mL/min   GFR calc Af Amer >60 >60 mL/min   Anion gap 13 5 - 15  Basic metabolic panel  Result Value Ref Range   Sodium 136 135 - 145 mmol/L   Potassium 4.6 3.5 - 5.1 mmol/L   Chloride 100 (L) 101 - 111 mmol/L   CO2 26 22 - 32 mmol/L   Glucose, Bld 110 (H) 65 - 99 mg/dL   BUN 21 (H) 6 - 20 mg/dL   Creatinine, Ser 1.22 0.61 - 1.24 mg/dL   Calcium 10.0 8.9 - 10.3 mg/dL   GFR calc non Af Amer >60 >60 mL/min   GFR calc Af Amer >60 >60 mL/min   Anion gap 10 5 - 15  CBC  Result Value Ref Range   WBC 9.4 4.0 - 10.5 K/uL    RBC 3.66 (L) 4.22 - 5.81 MIL/uL   Hemoglobin 10.7 (L) 13.0 - 17.0 g/dL   HCT 33.0 (L) 39.0 - 52.0 %   MCV 90.2 78.0 - 100.0 fL   MCH 29.2 26.0 - 34.0 pg   MCHC 32.4 30.0 - 36.0 g/dL   RDW 13.5 11.5 - 15.5 %   Platelets 338 150 - 400 K/uL  ECHOCARDIOGRAM LIMITED  Result Value Ref Range   Weight 2,752 oz   Height 71 in   BP 101/78 mmHg   LV PW d 9 (A) 0.6 - 1.1 mm   FS 43 28 - 44 %   LA vol 44.1 mL   LA ID, A-P, ES 33 mm   IVS/LV PW RATIO, ED 1.11    Stroke v 44 ml   LA diam index 1.68 cm/m2   LA vol A4C 40.9 ml   LVOT diameter 27 mm   LVOT area 5.73 cm2   LV sys vol 30 21 - 61 mL   LV sys vol index 15.0 mL/m2   LV dias vol 74 62 - 150 mL   LV dias vol index 37.0 mL/m2   LA vol index 22.4 mL/m2   LA diam end sys 33.00 mm   Simpson's disk 60.00   Type and screen [place]  Result Value Ref Range   ABO/RH(D) A POS    Antibody Screen NEG    Sample Expiration 11/29/2016     Dg Cervical Spine 2 Or 3 Views  Result Date: 11/26/2016 CLINICAL DATA:  61 year old male status post C6-C7 fusion and evacuation of epidural abscess on 11/17/2016. EXAM: CERVICAL SPINE - 2-3 VIEW COMPARISON:  Cervical spine MRI dated 11/17/2016 FINDINGS: There is straightening of the normal cervical  lordosis which may be positional or due to muscle spasm. No acute fracture or subluxation identified. The bones are osteopenic. There is postsurgical changes of ACDF at C6-C7. There is anterior paravertebral soft tissue thickening measuring up to 2 cm in thickness which may be related to postsurgical edema. Residual fluid collection or abscess is not excluded. Clinical correlation is recommended. The visualized upper airways are patent. IMPRESSION: 1. No acute fracture or subluxation. 2. Postsurgical changes of C6-C7 ACDF. 3. Anterior paravertebral soft tissue thickening measure up to 2 cm anterior to C6 and may be postsurgical edema or represent residual fluid collection or abscess. Clinical  correlation recommended. Electronically Signed   By: Anner Crete M.D.   On: 11/26/2016 07:09   Dg Cervical Spine 2-3 Views  Result Date: 11/17/2016 CLINICAL DATA:  Intraoperative imaging for C6-7 ACDF. EXAM: CERVICAL SPINE - 2-3 VIEW; DG C-ARM 61-120 MIN COMPARISON:  MRI cervical spine earlier today. FINDINGS: Two fluoroscopic intraoperative spot views of the cervical spine in the lateral projection demonstrate anterior plate and screws interbody spacer in place at C6-7. No acute finding. IMPRESSION: C6-7 ACDF. Electronically Signed   By: Inge Rise M.D.   On: 11/17/2016 16:32   Ct Soft Tissue Neck W Wo Contrast  Result Date: 11/26/2016 CLINICAL DATA:  Wound infection after cervical spine surgery 11/17/2016. EXAM: CT NECK WITH AND WITHOUT CONTRAST TECHNIQUE: Multidetector CT imaging of the neck was performed without and with intravenous contrast. CONTRAST:  36m ISOVUE-300 IOPAMIDOL (ISOVUE-300) INJECTION 61% COMPARISON:  Preoperative MRI of cervical spine 11/17/2016. Cervical spine radiographs 11/26/2016. FINDINGS: Pharynx and larynx: No focal mucosal or submucosal lesions are present. Vocal cords are midline and symmetric. The oropharynx and nasopharynx are within normal limits. Parapharyngeal fat is clear. Salivary glands: The submandibular and parotid glands are normal bilaterally. Thyroid: Thyroid is unremarkable. Lymph nodes: No significant cervical adenopathy is present. Vascular: Atherosclerotic calcifications are present at the carotid bifurcations bilaterally without significant stenosis. Limited intracranial: Within normal limits. Visualized orbits: Unremarkable. Mastoids and visualized paranasal sinuses: The visualized paranasal sinuses and mastoid air cells are clear. Skeleton: Anterior fusion hardware is present at or C6-7. The disc spacer is intact and in stable position. Prevertebral edema is present at and just above the surgical level. Edematous changes extend into the right neck  and surrounding the carotid space and sheath. There is some gas within the soft tissues on the right as well. There is fluid and hypoattenuation within the longus coli musculature on the right as well. There is no other discrete fluid collection to suggest abscess. Upper chest: The lung apices demonstrate scattered areas of ground-glass attenuation concerning for atelectasis. Early infection is not excluded. Atherosclerotic calcifications are present in the aorta. A right-sided PICC line is in place. IMPRESSION: 1. Extensive prevertebral edema extending into the right neck compatible with recent surgery. 2. Area of hypoattenuation within the longus coli musculature on the right on image 66 of series 7 raises concern for underlying infection. 3. No other discrete abscess is present. 4. Anterior fusion at C6-7 without change in hardware. 5. Scattered ground-glass attenuation in the upper lobes, left greater than right. While this may represent atelectasis, infection is also considered. Electronically Signed   By: CSan MorelleM.D.   On: 11/26/2016 10:40   Mr Cervical Spine W Or Wo Contrast  Result Date: 11/17/2016 CLINICAL DATA:  Neck pain, fever, and weakness.  Fall. EXAM: MRI CERVICAL SPINE WITHOUT AND WITH CONTRAST TECHNIQUE: Multiplanar and multiecho pulse sequences of the cervical  spine, to include the craniocervical junction and cervicothoracic junction, were obtained without and with intravenous contrast. CONTRAST:  7 mL MultiHance COMPARISON:  None. FINDINGS: The study is moderately to severely motion degraded throughout. Alignment: Trace retrolisthesis of C6 on C7. Vertebrae: Minimal endplate edema and enhancement at C6-7 with moderate to severe disc space narrowing. Only minimal STIR hyperintensity in the disc. Cord: Limited assessment due to motion artifact. Questionable T2 hyperintensity in the cord at the C7-T1 level on the sagittal T2 sequence. Posterior Fossa, vertebral arteries, paraspinal  tissues: Diffuse prevertebral edema and enhancement throughout the cervical and visualized upper thoracic spine. Small volume prevertebral fluid, greatest at C4 with AP thickness of 5 mm. Mild edema in the posterior cervical paraspinal soft tissues including in the interspinous soft tissues in the mid and lower cervical spine. Disc levels: There is extensive circumferential epidural enhancement throughout the cervical spine and extending into the visualized upper thoracic spine. A nonenhancing T2 hyperintense ventral epidural collection at C6-7 measures approximately 2 cm in craniocaudal length and 6 mm in AP thickness and results in severe spinal stenosis with moderate to severe cord compression. IMPRESSION: 1. Extensive epidural enhancement throughout the cervical and visualized upper thoracic spine consistent with infectious phlegmon. 2. 2 cm ventral epidural abscess at C6-7 with severe spinal stenosis and cord compression. 3. Mild endplate edema and enhancement at C6-7 which may be degenerative or reflect osteomyelitis. 4. Diffuse prevertebral edema/phlegmon and small volume fluid. Critical Value/emergent results were called by telephone at the time of interpretation on 11/17/2016 at 10:23 am to Dr. Quintella Reichert , who verbally acknowledged these results. Electronically Signed   By: Logan Bores M.D.   On: 11/17/2016 10:27   Dg Chest Port 1 View  Result Date: 11/20/2016 CLINICAL DATA:  Fever EXAM: PORTABLE CHEST 1 VIEW COMPARISON:  11/17/2016 FINDINGS: Minimal atelectasis at the right base. No acute consolidation or effusion. Stable cardiomediastinal silhouette with atherosclerosis. No pneumothorax. Interim visualization of lower cervical hardware. IMPRESSION: Minimal atelectasis at the right base. No acute pulmonary infiltrate. Electronically Signed   By: Donavan Foil M.D.   On: 11/20/2016 19:29   Dg Chest Port 1 View  Result Date: 11/17/2016 CLINICAL DATA:  Increase weakness and fever over the past 3  days. The patient has sustained a fall today striking his head. History of ITP, chronic renal insufficiency, hypertension, former smoker. EXAM: PORTABLE CHEST 1 VIEW COMPARISON:  None in PACs FINDINGS: The lungs are well-expanded. The interstitial markings are coarse. There is no alveolar infiltrate. There is no pleural effusion. The heart and pulmonary vascularity are normal. There is calcification in the wall of the aortic arch and descending thoracic aorta. There is degenerative joint space loss of the right shoulder. The observed bony thorax is unremarkable. IMPRESSION: Chronic bronchitic -smoking related interstitial changes. No pneumonia nor CHF. Thoracic aortic atherosclerosis. Electronically Signed   By: Daphnee Preiss  [place] M.D.   On: 11/17/2016 08:38   Dg C-arm 1-60 Min  Result Date: 11/17/2016 CLINICAL DATA:  Intraoperative imaging for C6-7 ACDF. EXAM: CERVICAL SPINE - 2-3 VIEW; DG C-ARM 61-120 MIN COMPARISON:  MRI cervical spine earlier today. FINDINGS: Two fluoroscopic intraoperative spot views of the cervical spine in the lateral projection demonstrate anterior plate and screws interbody spacer in place at C6-7. No acute finding. IMPRESSION: C6-7 ACDF. Electronically Signed   By: Inge Rise M.D.   On: 11/17/2016 16:32    Antibiotics:  Anti-infectives    Start     Dose/Rate Route Frequency Ordered Stop  12/01/16 0000  rifampin (RIFADIN) 300 MG capsule     300 mg Oral 2 times daily with meals 12/01/16 1334 01/22/17 2359   12/01/16 0000  ceFAZolin (ANCEF) IVPB     2 g Intravenous Every 8 hours 12/01/16 1334 01/22/17 2359   12/01/16 0000  ceFAZolin (ANCEF) 2-4 GM/100ML-% IVPB     2 g 200 mL/hr over 30 Minutes Intravenous Every 8 hours 12/01/16 1334     11/30/16 0630  ceFAZolin (ANCEF) IVPB 2g/100 mL premix     2 g 200 mL/hr over 30 Minutes Intravenous Every 8 hours 11/30/16 0620     11/29/16 1800  cefTRIAXone (ROCEPHIN) 2 g in dextrose 5 % 50 mL IVPB  Status:  Discontinued     2 g 100  mL/hr over 30 Minutes Intravenous Every 24 hours 11/29/16 1702 11/30/16 0615   11/29/16 1700  cefTRIAXone (ROCEPHIN) IVPB 2 g  Status:  Discontinued     2 g 100 mL/hr over 30 Minutes Intravenous Every 24 hours 11/29/16 1604 11/29/16 1703   11/29/16 1700  rifampin (RIFADIN) capsule 300 mg     300 mg Oral 2 times daily with meals 11/29/16 1604     11/27/16 1000  ceFAZolin (ANCEF) IVPB 2g/100 mL premix  Status:  Discontinued     2 g 200 mL/hr over 30 Minutes Intravenous Every 8 hours 11/27/16 0955 11/29/16 1153   11/26/16 1500  ceFEPIme (MAXIPIME) 2 g in dextrose 5 % 50 mL IVPB  Status:  Discontinued     2 g 100 mL/hr over 30 Minutes Intravenous Every 12 hours 11/26/16 1450 11/27/16 0936   11/26/16 1300  cefTRIAXone (ROCEPHIN) IVPB 2 g  Status:  Discontinued     2 g 100 mL/hr over 30 Minutes Intravenous Every 24 hours 11/26/16 0032 11/26/16 1435   11/26/16 1113  vancomycin (VANCOCIN) powder  Status:  Discontinued       As needed 11/26/16 1114 11/26/16 1129   11/26/16 1109  bacitracin 50,000 Units in sodium chloride irrigation 0.9 % 500 mL irrigation  Status:  Discontinued       As needed 11/26/16 1109 11/26/16 1129   11/26/16 0800  rifampin (RIFADIN) capsule 300 mg  Status:  Discontinued     300 mg Oral 2 times daily with meals 11/26/16 0032 11/29/16 1153      Discharge Exam: Blood pressure 106/73, pulse (!) 120, temperature 98.2 F (36.8 C), temperature source Oral, resp. rate 20, height 5\' 11"  (1.803 m), weight 78 kg (172 lb), SpO2 98 %. Neurologic: Grossly normal Incision clean dry and intact  Discharge Medications:   Allergies as of 12/01/2016      Reactions   Lipitor [atorvastatin] Other (See Comments)   Heart races   Penicillins Rash, Other (See Comments)   "I break out in between my fingers, rash, hives or whatever". Tolerated Ancef and Rocephin      Medication List    STOP taking these medications   cefTRIAXone 2 g in dextrose 5 % 50 mL   cefTRIAXone IVPB Commonly  known as:  ROCEPHIN     TAKE these medications   calcium carbonate 600 MG Tabs tablet Commonly known as:  OS-CAL Take 600 mg by mouth daily.   ceFAZolin 2-4 GM/100ML-% IVPB Commonly known as:  ANCEF Inject 100 mLs (2 g total) into the vein every 8 (eight) hours.   ceFAZolin IVPB Commonly known as:  ANCEF Inject 2 g into the vein every 8 (eight) hours. Indication:  MSSA bacteremia/epifudural  abscess Last Day of Therapy:  01/22/17 Labs - Once weekly:  CBC/D and BMP, Labs - Every other week:  ESR and CRP   enalapril 10 MG tablet Commonly known as:  VASOTEC Take 10 mg by mouth daily.   oxyCODONE 5 MG immediate release tablet Commonly known as:  Oxy IR/ROXICODONE Take 1 tablet (5 mg total) by mouth every 6 (six) hours as needed for breakthrough pain.   predniSONE 5 MG tablet Commonly known as:  DELTASONE Take 1 tablet (5 mg total) by mouth daily.   rifampin 300 MG capsule Commonly known as:  RIFADIN Take 1 capsule (300 mg total) by mouth 2 (two) times daily with a meal.   tamsulosin 0.4 MG Caps capsule Commonly known as:  FLOMAX Take 1 capsule (0.4 mg total) by mouth daily after supper.            Home Infusion Instuctions        Start     Ordered   12/01/16 0000  Home infusion instructions [facility] May follow [place] Dosing Protocol; May administer Cathflo as needed to maintain patency of vascular access device.; Flushing of vascular access device: per [facility] Protocol: 0.9% NaCl pre/post medica...    Question Answer Comment  Instructions May follow [place] Dosing Protocol   Instructions May administer Cathflo as needed to maintain patency of vascular access device.   Instructions Flushing of vascular access device: per [facility] Protocol: 0.9% NaCl pre/post medication administration and prn patency; Heparin 100 u/ml, 23m for implanted ports and Heparin 10u/ml, 576mfor all other central venous catheters.   Instructions May follow [facility] Anaphylaxis Protocol for  First Dose Administration in the home: 0.9% NaCl at 25-50 ml/hr to maintain IV access for protocol meds. Epinephrine 0.3 ml IV/IM PRN and Benadryl 25-50 IV/IM PRN s/s of anaphylaxis.   Instructions [facility] Infusion Coordinator (RN) to assist per patient IV care needs in the home PRN.      12/01/16 1334      Disposition: Home  Final Dx: Urinary retention/ wound infection  Discharge Instructions    Call MD for:  difficulty breathing, headache or visual disturbances    Complete by:  As directed    Call MD for:  persistant nausea and vomiting    Complete by:  As directed    Call MD for:  redness, tenderness, or signs of infection (pain, swelling, redness, odor or green/yellow discharge around incision site)    Complete by:  As directed    Call MD for:  severe uncontrolled pain    Complete by:  As directed    Call MD for:  temperature >100.4    Complete by:  As directed    Diet - low sodium heart healthy    Complete by:  As directed    Home infusion instructions [facility] May follow [facility] Protocol; May administer Cathflo as needed to maintain patency of vascular access device.; [facility] vascular [facility]: 0.9% NaCl pre/post medica...    Complete by:  As directed    Instructions:  May follow [facility] Protocol   Instructions:  May administer Cathflo as needed to maintain patency of vascular access device.   Instructions:  Flushing of vascular access device[facility]: 0.9% NaCl pre/post medication administration and prn patency; Heparin 100 u/ml, 4m79mor implanted ports and Heparin 10u/ml, 4ml28mr all other central venous catheters.   Instructions:  [facility] follow [facility]  Anaphylaxis Protocol for First Dose Administration in the home: 0.9% NaCl at 25-50 ml/hr to maintain IV access for protocol meds. Epinephrine 0.3 ml IV/IM PRN and Benadryl 25-50 IV/IM PRN s/s of anaphylaxis.   Instructions:  [place] [facility]  Coordinator (RN) to assist per patient IV care needs in the home PRN.   Increase activity slowly    Complete by:  As directed       Follow-up Information    ESKRIDGE, MATTHEW, MD Follow up in 1 week(s).   Specialty:  Urology Contact information: [place] [address] [phone]        Eustace Moore, MD. Schedule an appointment as soon as possible for a visit in 2 week(s).   Specialty:  Neurosurgery Contact information: [address] [place] [address] [place] [address] [phone]        Eustace Moore, MD .   Specialty:  Neurosurgery Contact information: [address] [place] [address] [place] [address] [phone]            Signed: Eustace Moore 12/01/2016, 1:35 PM

## 2016-12-04 LAB — CULTURE, BLOOD (ROUTINE X 2)
CULTURE: NO GROWTH
Culture: NO GROWTH

## 2016-12-15 ENCOUNTER — Telehealth: Payer: Self-pay | Admitting: Pharmacist

## 2016-12-15 NOTE — Telephone Encounter (Signed)
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  He also had a rash to Ancef.  Checked with [facility] and his insurance covers daptomycin so will change him to daptomycin 8 mg/kg daily to finish out his abx course for his MSSA epidural abscess.  Dr. Baxter Flattery is in agreement.

## 2016-12-16 ENCOUNTER — Ambulatory Visit (INDEPENDENT_AMBULATORY_CARE_PROVIDER_SITE_OTHER): Payer: BLUE CROSS/BLUE SHIELD | Admitting: Internal Medicine

## 2016-12-16 ENCOUNTER — Encounter: Payer: Self-pay | Admitting: Internal Medicine

## 2016-12-16 VITALS — BP 94/63 | HR 132 | Temp 98.0°F | Ht 70.0 in | Wt 160.0 lb

## 2016-12-16 DIAGNOSIS — L282 Other prurigo: Secondary | ICD-10-CM

## 2016-12-16 DIAGNOSIS — L309 Dermatitis, unspecified: Secondary | ICD-10-CM | POA: Diagnosis not present

## 2016-12-16 DIAGNOSIS — T3795XA Adverse effect of unspecified systemic anti-infective and antiparasitic, initial encounter: Secondary | ICD-10-CM | POA: Diagnosis not present

## 2016-12-16 DIAGNOSIS — G061 Intraspinal abscess and granuloma: Secondary | ICD-10-CM | POA: Diagnosis not present

## 2016-12-16 DIAGNOSIS — L270 Generalized skin eruption due to drugs and medicaments taken internally: Secondary | ICD-10-CM | POA: Diagnosis not present

## 2016-12-16 DIAGNOSIS — Z5181 Encounter for therapeutic drug level monitoring: Secondary | ICD-10-CM

## 2016-12-16 DIAGNOSIS — A4901 Methicillin susceptible Staphylococcus aureus infection, unspecified site: Secondary | ICD-10-CM

## 2016-12-16 MED ORDER — PREDNISONE 10 MG PO TABS: 40.0000 mg | ORAL_TABLET | Freq: Every day | ORAL | 0 refills | Status: DC

## 2016-12-16 MED ORDER — HYDROXYZINE HCL 10 MG PO TABS: 10.0000 mg | ORAL_TABLET | Freq: Three times a day (TID) | ORAL | 0 refills | Status: DC | PRN

## 2016-12-16 NOTE — Progress Notes (Signed)
RFV: follow up for cervical spinal epidural abscess with MSSA  Patient ID: Thomas Nielsen, male   DOB: 09-Aug-1956, 61 y.o.   MRN: [id number]  HPI  61yo M with MSSA bacteremia and cervical spinal epidural abscess s/p evacuation and ACDF on 2/15, initially placed on ceftriaxone and rifampin but then complicated by superficial and deep wound infection that underwent wound exploration and debridement on 2/24. OR cx from 2/24 still + for MSSA. His peripheral cx were positive on 2/24 but repeat blood cx 2/27 were negative. He was discharged on 3/1 with cefazolin plus rifampin to complete 8 wk course til April 22nd. He has been at home receiving his infusion, however he started to develop maculopapular rash that was pruritic on Friday March 9th. He was changed over to vancomycin from Sunday til Wednesday, roughly 6 doses were administered. Due his rash becoming progressively worse, involving arms, legs, torso, neck and face, he went to his PCP for evaluation, where at that time his rash was attributed to vancomycin ,though it is unclear if he knew that he had recently been on cefazolin. The patient has remote hx of penicillin, though tolerated cephalosporins since 2/15. He received benadryl yestserday at the visit. We asked him to come to clinic today to be evaluated.  He denies fever, chills, though has diffuse intensely pruritic rash. No swelling, facial swelling, or neck swelling. He did report 2 soft bowel movements. No thrush, no candidal groin infection   Outpatient Encounter Prescriptions as of 12/16/2016  Medication Sig  . amitriptyline (ELAVIL) 25 MG tablet TAKE 1 TABLET BY MOUTH BEFORE BEDTIME  . calcium carbonate (OS-CAL) 600 MG TABS Take 600 mg by mouth daily.  Marland Kitchen ceFAZolin (ANCEF) 2-4 GM/100ML-% IVPB Inject 100 mLs (2 g total) into the vein every 8 (eight) hours.  Marland Kitchen ceFAZolin (ANCEF) IVPB Inject 2 g into the vein every 8 (eight) hours. Indication:  MSSA bacteremia/epifudural abscess Last Day  of Therapy:  01/22/17 Labs - Once weekly:  CBC/D and BMP, Labs - Every other week:  ESR and CRP  . enalapril (VASOTEC) 10 MG tablet Take 10 mg by mouth daily.   Marland Kitchen oxyCODONE (OXY IR/ROXICODONE) 5 MG immediate release tablet Take 1 tablet (5 mg total) by mouth every 6 (six) hours as needed for breakthrough pain.  . predniSONE (DELTASONE) 5 MG tablet Take 1 tablet (5 mg total) by mouth daily.  . rifampin (RIFADIN) 300 MG capsule Take 1 capsule (300 mg total) by mouth 2 (two) times daily with a meal.  . tamsulosin (FLOMAX) 0.4 MG CAPS capsule Take 1 capsule (0.4 mg total) by mouth daily after supper.   No facility-administered encounter medications on file as of 12/16/2016.      Patient Active Problem List   Diagnosis Date Noted  . 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  . Surgery, elective   . Staphylococcus aureus bacteremia with sepsis ([place])   . Nephrotic syndrome   . 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  . Anemia, unspecified 09/06/2013  . 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 HTN (hypertension)   . Hyperlipidemia   . ITP (idiopathic thrombocytopenic purpura)   . Stage 3 chronic kidney disease      Health Maintenance Due  Topic Date Due  . TETANUS/TDAP  12/24/1972  . COLONOSCOPY  12/25/2003  . INFLUENZA VACCINE  05/03/2016     Review of Systems Per hpi ,also has loss of appetite, poor po intake in the last 3  days Physical Exam   BP 94/63   Pulse (!) 132   Temp 98 F (36.7 C) (Oral)   Ht _0  (1.778 m)   Wt 160 lb (72.6 kg)   BMI 22.96 kg/m  Physical Exam  Constitutional: He is oriented to person, place, and time. He appears well-developed and well-nourished. No distress.  HENT: dry mucous membranes Mouth/Throat: Oropharynx is clear and moist. No oropharyngeal exudate.  Cardiovascular: Normal rate, regular rhythm and normal heart sounds. Exam reveals no gallop and no friction rub.  No murmur heard.  Pulmonary/Chest:  Effort normal and breath sounds normal. No respiratory distress. He has no wheezes.  Abdominal: Soft. Bowel sounds are normal. He exhibits no distension. There is no tenderness.  Lymphadenopathy:  He has no cervical adenopathy.  Neurological: He is alert and oriented to person, place, and time.  Skin: his hands have marked dryness and cracking from his baseline eczema though he does have diffuse macularpapular rash to all surfaces. Some excoriation noted to abdomen and flanks and elgs Psychiatric: He has a normal mood and affect. His behavior is normal.    Lab Results  Component Value Date   HEPBSAB NEG 12/01/2010   No results found for: RPR, LABRPR  CBC Lab Results  Component Value Date   WBC 9.4 11/29/2016   RBC 3.66 (L) 11/29/2016   HGB 10.7 (L) 11/29/2016   HCT 33.0 (L) 11/29/2016   PLT 338 11/29/2016   MCV 90.2 11/29/2016   MCH 29.2 11/29/2016   MCHC 32.4 11/29/2016   RDW 13.5 11/29/2016   LYMPHSABS 1.0 11/26/2016   MONOABS 0.9 11/26/2016   EOSABS 0.1 11/26/2016    BMET Lab Results  Component Value Date   NA 136 11/29/2016   K 4.6 11/29/2016   CL 100 (L) 11/29/2016   CO2 26 11/29/2016   GLUCOSE 110 (H) 11/29/2016   BUN 21 (H) 11/29/2016   CREATININE 1.22 11/29/2016   CALCIUM 10.0 11/29/2016   GFRNONAA >60 11/29/2016   GFRAA >60 11/29/2016     Assessment and Plan  Likely cephalosporin related allergic drug rash = will treat with steroids. Have given instructions for prednisone taper, in addition to doing hydroxyzine to help with pruritic component of rash  - I suspect this is still related to cephalosporin as the most common offender though it may also be vancomycin or rifampin.   Therapeutic drug monitoring -His vanco has also been stopped since Wednesday am. Random of vanco yesterday was 7, likely cleared by today  MSSA bacteremia with spinal epidural infection = will have start daptomycin at 56m/kg will check baseline ck. End date to be April 24 Hold  rifampin for now and wait til his rash resolves to decide if can reintroduce  ezcema = recommended eucerin plus vaseline to help minimize dryness  Will check back on Monday with the patient to see how he is doing. He has return appt on 3/23

## 2016-12-16 NOTE — Patient Instructions (Signed)
Please stop taking rifampin   Temporarily stop your prednisone 5mg  until you finish prednisone taper  Start prednisone 40mg  daily x 2 days (today), then 30mg  daily x 2 days, then 20mg  daily x 3 days, then 10 mg daily x 3 days  Please take hydroxyzine for itchiness. Can take up to 3 times per day  Consider taking zantac if still having rash  If you have any issues over the weekend, call main hospital at [phone] -- ask to Page Dr Baxter Flattery    I will see you back on 3/23 at 9:30 am

## 2016-12-19 ENCOUNTER — Telehealth: Payer: Self-pay | Admitting: Internal Medicine

## 2016-12-19 NOTE — Telephone Encounter (Signed)
Patient is slowly recovering from his diffuse maculopapular rash. He was started on high dose steroids and hydroxyzine on 3/16. Today, he reports feeling better, less itching, rash slowly resolving. We will see him in follow up on 3/23. Continue with steroid taper  He was started on daptomycin on 3/17, tolerated thus far

## 2016-12-20 ENCOUNTER — Other Ambulatory Visit: Payer: Self-pay | Admitting: Neurological Surgery

## 2016-12-20 ENCOUNTER — Ambulatory Visit
Admission: RE | Admit: 2016-12-20 | Discharge: 2016-12-20 | Disposition: A | Discharge status: Home / Self Care | Payer: BLUE CROSS/BLUE SHIELD | Source: Ambulatory Visit | Attending: Neurological Surgery | Admitting: Neurological Surgery

## 2016-12-20 DIAGNOSIS — M4642 Discitis, unspecified, cervical region: Secondary | ICD-10-CM

## 2016-12-20 MED ORDER — GADOBENATE DIMEGLUMINE 529 MG/ML IV SOLN
15.0000 mL | Freq: Once | INTRAVENOUS | Status: AC | PRN
  Administered 2016-12-20: 15 mL via INTRAVENOUS

## 2016-12-21 ENCOUNTER — Other Ambulatory Visit: Payer: Self-pay | Admitting: Neurological Surgery

## 2016-12-21 ENCOUNTER — Telehealth: Payer: Self-pay | Admitting: *Deleted

## 2016-12-21 ENCOUNTER — Encounter (HOSPITAL_COMMUNITY)
Admission: RE | Admit: 2016-12-21 | Discharge: 2016-12-21 | Disposition: A | Discharge status: Home / Self Care | Payer: BLUE CROSS/BLUE SHIELD | Source: Ambulatory Visit | Attending: Neurological Surgery | Admitting: Neurological Surgery

## 2016-12-21 ENCOUNTER — Other Ambulatory Visit (HOSPITAL_COMMUNITY): Payer: Self-pay | Admitting: *Deleted

## 2016-12-21 ENCOUNTER — Encounter (HOSPITAL_COMMUNITY): Payer: Self-pay

## 2016-12-21 DIAGNOSIS — M4642 Discitis, unspecified, cervical region: Secondary | ICD-10-CM

## 2016-12-21 HISTORY — DX: Dermatitis, unspecified: L30.9

## 2016-12-21 LAB — CBC WITH DIFFERENTIAL/PLATELET
BASOS ABS: 0.1 10*3/uL (ref 0.0–0.1)
BASOS PCT: 1 %
EOS ABS: 0.1 10*3/uL (ref 0.0–0.7)
Eosinophils Relative: 1 %
HEMATOCRIT: 31.2 % — AB (ref 39.0–52.0)
Hemoglobin: 10.2 g/dL — ABNORMAL LOW (ref 13.0–17.0)
Lymphocytes Relative: 9 %
Lymphs Abs: 0.8 10*3/uL (ref 0.7–4.0)
MCH: 28.3 pg (ref 26.0–34.0)
MCHC: 32.7 g/dL (ref 30.0–36.0)
MCV: 86.7 fL (ref 78.0–100.0)
MONOS PCT: 4 %
Monocytes Absolute: 0.3 10*3/uL (ref 0.1–1.0)
NEUTROS ABS: 7.5 10*3/uL (ref 1.7–7.7)
NEUTROS PCT: 85 %
Platelets: 317 10*3/uL (ref 150–400)
RBC: 3.6 MIL/uL — AB (ref 4.22–5.81)
RDW: 13.1 % (ref 11.5–15.5)
WBC: 8.8 10*3/uL (ref 4.0–10.5)

## 2016-12-21 LAB — BASIC METABOLIC PANEL
ANION GAP: 12 (ref 5–15)
BUN: 21 mg/dL — ABNORMAL HIGH (ref 6–20)
CALCIUM: 10 mg/dL (ref 8.9–10.3)
CO2: 24 mmol/L (ref 22–32)
CREATININE: 1.11 mg/dL (ref 0.61–1.24)
Chloride: 101 mmol/L (ref 101–111)
GFR calc non Af Amer: >60 mL/min (ref >60)
Glucose, Bld: 115 mg/dL — ABNORMAL HIGH (ref 65–99)
Potassium: 4.2 mmol/L (ref 3.5–5.1)
SODIUM: 137 mmol/L (ref 135–145)

## 2016-12-21 LAB — TYPE AND SCREEN
ABO/RH(D): A POS
ANTIBODY SCREEN: NEGATIVE

## 2016-12-21 LAB — PROTIME-INR
INR: 1.03
Prothrombin Time: 13.5 seconds (ref 11.4–15.2)

## 2016-12-21 LAB — SURGICAL PCR SCREEN
MRSA, PCR: NEGATIVE
Staphylococcus aureus: NEGATIVE

## 2016-12-21 NOTE — Telephone Encounter (Signed)
I spoke to patient and sister regarding the plan to continue his current abtx regimen of daptomycin. I have asked them to restart rifampin, which was temporarily held. We held rifampin since unsure if it was contributing to his rash, which I think was still all due to cefazolin. He is scheduled to surgery on Friday afternoon. We will see him back on ID service either on Friday late afternoon or Saturday morning

## 2016-12-21 NOTE — Telephone Encounter (Signed)
Patient is scheduled for surgery 12/23/16.  Sister is asking, should he continue all antibiotics, oral and IV?

## 2016-12-21 NOTE — Progress Notes (Signed)
Pt here with his sister for PAT appt. Pt denies cardiac history, chest pain or sob. Pt denies being diabetic. Pt has a PICC line due to a staph infection and is receiving IV antibiotics at home. PICC line is in his left arm.

## 2016-12-21 NOTE — Pre-Procedure Instructions (Signed)
Thomas Nielsen  12/21/2016     Your procedure is scheduled on Friday, December 23, 2016 at 2:00 PM.   Report to [facility] Entrance "A" Admitting Office at 12 noon.   Call this number if you have problems the morning of surgery: [phone]   Questions prior to day of surgery, please call [phone] between 8 & 4 PM.   Remember:  Do not eat food or drink liquids after midnight Thursday, 12/22/16.  Take these medicines the morning of surgery with A SIP OF WATER: Prednisone, Oxycodone - if needed, Hydroxyzine - if needed   Do not wear jewelry.  Do not wear lotions, powders or cologne.  Men may shave face and neck.  Do not bring valuables to the hospital.  [facility] is not responsible for any belongings or valuables.  Contacts, dentures or bridgework may not be worn into surgery.  Leave your suitcase in the car.  After surgery it may be brought to your room.  For patients admitted to the hospital, discharge time will be determined by your treatment team.  Special instructions:  [place] - Preparing for Surgery  Before surgery, you can play an important role.  Because skin is not sterile, your skin needs to be as free of germs as possible.  You can reduce the number of germs on you skin by washing with CHG (chlorahexidine gluconate) soap before surgery.  CHG is an antiseptic cleaner which kills germs and bonds with the skin to continue killing germs even after washing.  Please DO NOT use if you have an allergy to CHG or antibacterial soaps.  If your skin becomes reddened/irritated stop using the CHG and inform your nurse when you arrive at Short Stay.  Do not shave (including legs and underarms) for at least 48 hours prior to the first CHG shower.  You may shave your face.  Please follow these instructions carefully:   1.  Shower with CHG Soap the night before surgery and the                    morning of Surgery.  2.  If you choose to wash your hair, wash your hair  first as usual with your       normal shampoo.  3.  After you shampoo, rinse your hair and body thoroughly to remove the shampoo.  4.  Use CHG as you would any other liquid soap.  You can apply chg directly       to the skin and wash gently with scrungie or a clean washcloth.  5.  Apply the CHG Soap to your body ONLY FROM THE NECK DOWN.        Do not use on open wounds or open sores.  Avoid contact with your eyes, ears, mouth and genitals (private parts).  Wash genitals (private parts) with your normal soap.  6.  Wash thoroughly, paying special attention to the area where your surgery        will be performed.  7.  Thoroughly rinse your body with warm water from the neck down.  8.  DO NOT shower/wash with your normal soap after using and rinsing off       the CHG Soap.  9.  Pat yourself dry with a clean towel.            10.  Wear clean pajamas.            11.  Place  clean sheets on your bed the night of your first shower and do not        sleep with pets.  Day of Surgery  Do not apply any lotions the morning of surgery.  Please wear clean clothes to the hospital.   Please read over the fact sheets that you were given.

## 2016-12-22 ENCOUNTER — Ambulatory Visit
Admission: RE | Admit: 2016-12-22 | Discharge: 2016-12-22 | Disposition: A | Discharge status: Home / Self Care | Payer: BLUE CROSS/BLUE SHIELD | Source: Ambulatory Visit | Attending: Neurological Surgery | Admitting: Neurological Surgery

## 2016-12-22 DIAGNOSIS — M4642 Discitis, unspecified, cervical region: Secondary | ICD-10-CM

## 2016-12-23 ENCOUNTER — Inpatient Hospital Stay (HOSPITAL_COMMUNITY): Payer: BLUE CROSS/BLUE SHIELD

## 2016-12-23 ENCOUNTER — Inpatient Hospital Stay (HOSPITAL_COMMUNITY)
Admission: RE | Admit: 2016-12-23 | Discharge: 2016-12-26 | DRG: 471 | Disposition: A | Discharge status: Home Health | Payer: BLUE CROSS/BLUE SHIELD | Source: Ambulatory Visit | Attending: Neurological Surgery | Admitting: Neurological Surgery

## 2016-12-23 ENCOUNTER — Ambulatory Visit: Payer: BLUE CROSS/BLUE SHIELD | Admitting: Internal Medicine

## 2016-12-23 ENCOUNTER — Encounter (HOSPITAL_COMMUNITY)
Admission: RE | Disposition: A | Discharge status: Home Health | Payer: Self-pay | Source: Ambulatory Visit | Attending: Neurological Surgery

## 2016-12-23 ENCOUNTER — Inpatient Hospital Stay (HOSPITAL_COMMUNITY): Payer: BLUE CROSS/BLUE SHIELD | Admitting: Anesthesiology

## 2016-12-23 ENCOUNTER — Encounter (HOSPITAL_COMMUNITY): Payer: Self-pay | Admitting: Anesthesiology

## 2016-12-23 DIAGNOSIS — R21 Rash and other nonspecific skin eruption: Secondary | ICD-10-CM | POA: Diagnosis not present

## 2016-12-23 DIAGNOSIS — M40202 Unspecified kyphosis, cervical region: Secondary | ICD-10-CM | POA: Diagnosis present

## 2016-12-23 DIAGNOSIS — L270 Generalized skin eruption due to drugs and medicaments taken internally: Secondary | ICD-10-CM | POA: Diagnosis not present

## 2016-12-23 DIAGNOSIS — M4622 Osteomyelitis of vertebra, cervical region: Principal | ICD-10-CM | POA: Diagnosis present

## 2016-12-23 DIAGNOSIS — Z881 Allergy status to other antibiotic agents status: Secondary | ICD-10-CM | POA: Diagnosis not present

## 2016-12-23 DIAGNOSIS — Z87891 Personal history of nicotine dependence: Secondary | ICD-10-CM | POA: Diagnosis not present

## 2016-12-23 DIAGNOSIS — Z8042 Family history of malignant neoplasm of prostate: Secondary | ICD-10-CM | POA: Diagnosis not present

## 2016-12-23 DIAGNOSIS — R7881 Bacteremia: Secondary | ICD-10-CM | POA: Diagnosis present

## 2016-12-23 DIAGNOSIS — Z880 Allergy status to penicillin: Secondary | ICD-10-CM | POA: Diagnosis not present

## 2016-12-23 DIAGNOSIS — D649 Anemia, unspecified: Secondary | ICD-10-CM | POA: Diagnosis present

## 2016-12-23 DIAGNOSIS — R197 Diarrhea, unspecified: Secondary | ICD-10-CM | POA: Diagnosis not present

## 2016-12-23 DIAGNOSIS — Z8249 Family history of ischemic heart disease and other diseases of the circulatory system: Secondary | ICD-10-CM

## 2016-12-23 DIAGNOSIS — T366X5A Adverse effect of rifampicins, initial encounter: Secondary | ICD-10-CM | POA: Diagnosis not present

## 2016-12-23 DIAGNOSIS — Y828 Other medical devices associated with adverse incidents: Secondary | ICD-10-CM | POA: Diagnosis present

## 2016-12-23 DIAGNOSIS — Y929 Unspecified place or not applicable: Secondary | ICD-10-CM | POA: Diagnosis not present

## 2016-12-23 DIAGNOSIS — Z7952 Long term (current) use of systemic steroids: Secondary | ICD-10-CM

## 2016-12-23 DIAGNOSIS — L509 Urticaria, unspecified: Secondary | ICD-10-CM | POA: Diagnosis not present

## 2016-12-23 DIAGNOSIS — Z820 Family history of epilepsy and other diseases of the nervous system: Secondary | ICD-10-CM | POA: Diagnosis not present

## 2016-12-23 DIAGNOSIS — G4700 Insomnia, unspecified: Secondary | ICD-10-CM | POA: Diagnosis present

## 2016-12-23 DIAGNOSIS — L271 Localized skin eruption due to drugs and medicaments taken internally: Secondary | ICD-10-CM | POA: Diagnosis not present

## 2016-12-23 DIAGNOSIS — Z79899 Other long term (current) drug therapy: Secondary | ICD-10-CM | POA: Diagnosis not present

## 2016-12-23 DIAGNOSIS — E785 Hyperlipidemia, unspecified: Secondary | ICD-10-CM | POA: Diagnosis present

## 2016-12-23 DIAGNOSIS — Z888 Allergy status to other drugs, medicaments and biological substances status: Secondary | ICD-10-CM | POA: Diagnosis not present

## 2016-12-23 DIAGNOSIS — N052 Unspecified nephritic syndrome with diffuse membranous glomerulonephritis: Secondary | ICD-10-CM | POA: Diagnosis present

## 2016-12-23 DIAGNOSIS — Z981 Arthrodesis status: Secondary | ICD-10-CM

## 2016-12-23 DIAGNOSIS — B9561 Methicillin susceptible Staphylococcus aureus infection as the cause of diseases classified elsewhere: Secondary | ICD-10-CM | POA: Diagnosis present

## 2016-12-23 DIAGNOSIS — D693 Immune thrombocytopenic purpura: Secondary | ICD-10-CM | POA: Diagnosis present

## 2016-12-23 DIAGNOSIS — L309 Dermatitis, unspecified: Secondary | ICD-10-CM | POA: Diagnosis not present

## 2016-12-23 DIAGNOSIS — Z419 Encounter for procedure for purposes other than remedying health state, unspecified: Secondary | ICD-10-CM

## 2016-12-23 DIAGNOSIS — G061 Intraspinal abscess and granuloma: Secondary | ICD-10-CM | POA: Diagnosis present

## 2016-12-23 DIAGNOSIS — N049 Nephrotic syndrome with unspecified morphologic changes: Secondary | ICD-10-CM | POA: Diagnosis present

## 2016-12-23 DIAGNOSIS — B9562 Methicillin resistant Staphylococcus aureus infection as the cause of diseases classified elsewhere: Secondary | ICD-10-CM | POA: Diagnosis not present

## 2016-12-23 DIAGNOSIS — R000 Tachycardia, unspecified: Secondary | ICD-10-CM | POA: Diagnosis not present

## 2016-12-23 DIAGNOSIS — N059 Unspecified nephritic syndrome with unspecified morphologic changes: Secondary | ICD-10-CM | POA: Diagnosis not present

## 2016-12-23 DIAGNOSIS — A4901 Methicillin susceptible Staphylococcus aureus infection, unspecified site: Secondary | ICD-10-CM | POA: Diagnosis not present

## 2016-12-23 DIAGNOSIS — L409 Psoriasis, unspecified: Secondary | ICD-10-CM | POA: Diagnosis not present

## 2016-12-23 HISTORY — PX: POSTERIOR CERVICAL FUSION/FORAMINOTOMY: SHX5038

## 2016-12-23 SURGERY — POSTERIOR CERVICAL FUSION/FORAMINOTOMY LEVEL 5
Anesthesia: General

## 2016-12-23 MED ORDER — THROMBIN 20000 UNITS EX SOLR
CUTANEOUS | Status: DC | PRN
  Administered 2016-12-23: 16:00:00 via TOPICAL

## 2016-12-23 MED ORDER — ONDANSETRON HCL 4 MG PO TABS: 4.0000 mg | ORAL_TABLET | Freq: Four times a day (QID) | ORAL | Status: DC | PRN

## 2016-12-23 MED ORDER — ONDANSETRON HCL 4 MG/2ML IJ SOLN
4.0000 mg | Freq: Four times a day (QID) | INTRAMUSCULAR | Status: DC | PRN
  Administered 2016-12-24 – 2016-12-25 (×3): 4 mg via INTRAVENOUS
  Filled 2016-12-23 (×3): qty 2

## 2016-12-23 MED ORDER — DAPTOMYCIN IV (FOR PTA / DISCHARGE USE ONLY): 625.0000 mg | INTRAVENOUS | Status: DC

## 2016-12-23 MED ORDER — SUGAMMADEX SODIUM 200 MG/2ML IV SOLN
INTRAVENOUS | Status: DC | PRN
  Administered 2016-12-23: 200 mg via INTRAVENOUS

## 2016-12-23 MED ORDER — FENTANYL CITRATE (PF) 250 MCG/5ML IJ SOLN
INTRAMUSCULAR | Status: AC
  Filled 2016-12-23: qty 5

## 2016-12-23 MED ORDER — SODIUM CHLORIDE 0.9 % IV SOLN: 250.0000 mL | INTRAVENOUS | Status: DC

## 2016-12-23 MED ORDER — MIDAZOLAM HCL 2 MG/2ML IJ SOLN
INTRAMUSCULAR | Status: AC
  Filled 2016-12-23: qty 2

## 2016-12-23 MED ORDER — METHOCARBAMOL 1000 MG/10ML IJ SOLN: 500.0000 mg | Freq: Four times a day (QID) | INTRAMUSCULAR | Status: DC | PRN

## 2016-12-23 MED ORDER — THROMBIN 20000 UNITS EX SOLR
CUTANEOUS | Status: AC
  Filled 2016-12-23: qty 20000

## 2016-12-23 MED ORDER — FENTANYL CITRATE (PF) 100 MCG/2ML IJ SOLN
INTRAMUSCULAR | Status: DC | PRN
  Administered 2016-12-23 (×2): 50 ug via INTRAVENOUS
  Administered 2016-12-23: 100 ug via INTRAVENOUS
  Administered 2016-12-23: 50 ug via INTRAVENOUS

## 2016-12-23 MED ORDER — RIFAMPIN 300 MG PO CAPS
300.0000 mg | ORAL_CAPSULE | Freq: Two times a day (BID) | ORAL | Status: DC
  Administered 2016-12-24 – 2016-12-25 (×3): 300 mg via ORAL
  Filled 2016-12-23 (×3): qty 1

## 2016-12-23 MED ORDER — SODIUM CHLORIDE 0.9 % IR SOLN
Status: DC | PRN
  Administered 2016-12-23: 16:00:00

## 2016-12-23 MED ORDER — BUPIVACAINE HCL (PF) 0.25 % IJ SOLN
INTRAMUSCULAR | Status: DC | PRN
  Administered 2016-12-23: 7 mL

## 2016-12-23 MED ORDER — 0.9 % SODIUM CHLORIDE (POUR BTL) OPTIME
TOPICAL | Status: DC | PRN
  Administered 2016-12-23: 1000 mL

## 2016-12-23 MED ORDER — CHLORHEXIDINE GLUCONATE CLOTH 2 % EX PADS: 6.0000 | MEDICATED_PAD | Freq: Once | CUTANEOUS | Status: DC

## 2016-12-23 MED ORDER — PROPOFOL 10 MG/ML IV BOLUS
INTRAVENOUS | Status: DC | PRN
  Administered 2016-12-23: 150 mg via INTRAVENOUS

## 2016-12-23 MED ORDER — SODIUM CHLORIDE 0.9 % IV SOLN
625.0000 mg | INTRAVENOUS | Status: DC
  Administered 2016-12-24 – 2016-12-26 (×3): 625 mg via INTRAVENOUS
  Filled 2016-12-23 (×3): qty 12.5

## 2016-12-23 MED ORDER — BUPIVACAINE HCL (PF) 0.25 % IJ SOLN
INTRAMUSCULAR | Status: AC
  Filled 2016-12-23: qty 30

## 2016-12-23 MED ORDER — THROMBIN 5000 UNITS EX SOLR
CUTANEOUS | Status: AC
  Filled 2016-12-23: qty 5000

## 2016-12-23 MED ORDER — PHENYLEPHRINE HCL 10 MG/ML IJ SOLN
INTRAVENOUS | Status: DC | PRN
  Administered 2016-12-23: 15 ug/min via INTRAVENOUS

## 2016-12-23 MED ORDER — EPHEDRINE SULFATE 50 MG/ML IJ SOLN
INTRAMUSCULAR | Status: DC | PRN
  Administered 2016-12-23: 10 mg via INTRAVENOUS

## 2016-12-23 MED ORDER — ACETAMINOPHEN 10 MG/ML IV SOLN
INTRAVENOUS | Status: DC | PRN
  Administered 2016-12-23: 1000 mg via INTRAVENOUS

## 2016-12-23 MED ORDER — LIDOCAINE HCL (CARDIAC) 20 MG/ML IV SOLN
INTRAVENOUS | Status: DC | PRN
  Administered 2016-12-23: 60 mg via INTRAVENOUS

## 2016-12-23 MED ORDER — MIDAZOLAM HCL 5 MG/5ML IJ SOLN
INTRAMUSCULAR | Status: DC | PRN
  Administered 2016-12-23: 2 mg via INTRAVENOUS

## 2016-12-23 MED ORDER — BACITRACIN ZINC 500 UNIT/GM EX OINT
TOPICAL_OINTMENT | CUTANEOUS | Status: AC
  Filled 2016-12-23: qty 28.35

## 2016-12-23 MED ORDER — PHENYLEPHRINE HCL 10 MG/ML IJ SOLN
INTRAMUSCULAR | Status: DC | PRN
  Administered 2016-12-23: 40 ug via INTRAVENOUS
  Administered 2016-12-23 (×2): 80 ug via INTRAVENOUS
  Administered 2016-12-23: 40 ug via INTRAVENOUS
  Administered 2016-12-23: 80 ug via INTRAVENOUS

## 2016-12-23 MED ORDER — THROMBIN 5000 UNITS EX SOLR
OROMUCOSAL | Status: DC | PRN
  Administered 2016-12-23: 16:00:00 via TOPICAL

## 2016-12-23 MED ORDER — AMITRIPTYLINE HCL 25 MG PO TABS: 25.0000 mg | ORAL_TABLET | Freq: Every evening | ORAL | Status: DC | PRN

## 2016-12-23 MED ORDER — ACETAMINOPHEN 650 MG RE SUPP: 650.0000 mg | RECTAL | Status: DC | PRN

## 2016-12-23 MED ORDER — VANCOMYCIN HCL 1000 MG IV SOLR
INTRAVENOUS | Status: AC
  Filled 2016-12-23: qty 1000

## 2016-12-23 MED ORDER — OXYCODONE HCL 5 MG PO TABS
5.0000 mg | ORAL_TABLET | Freq: Four times a day (QID) | ORAL | Status: DC | PRN
  Administered 2016-12-23 – 2016-12-25 (×5): 5 mg via ORAL
  Filled 2016-12-23 (×5): qty 1

## 2016-12-23 MED ORDER — ACETAMINOPHEN 325 MG PO TABS
650.0000 mg | ORAL_TABLET | ORAL | Status: DC | PRN
  Administered 2016-12-24 – 2016-12-26 (×2): 650 mg via ORAL
  Filled 2016-12-23 (×2): qty 2

## 2016-12-23 MED ORDER — TAMSULOSIN HCL 0.4 MG PO CAPS
0.4000 mg | ORAL_CAPSULE | Freq: Every day | ORAL | Status: DC
  Administered 2016-12-24 – 2016-12-25 (×2): 0.4 mg via ORAL
  Filled 2016-12-23 (×2): qty 1

## 2016-12-23 MED ORDER — SODIUM CHLORIDE 0.9% FLUSH: 3.0000 mL | INTRAVENOUS | Status: DC | PRN

## 2016-12-23 MED ORDER — VANCOMYCIN HCL IN DEXTROSE 1-5 GM/200ML-% IV SOLN
1000.0000 mg | INTRAVENOUS | Status: AC
  Administered 2016-12-23: 1000 mg via INTRAVENOUS
  Filled 2016-12-23: qty 200

## 2016-12-23 MED ORDER — ACETAMINOPHEN 10 MG/ML IV SOLN
INTRAVENOUS | Status: AC
  Filled 2016-12-23: qty 100

## 2016-12-23 MED ORDER — ROCURONIUM BROMIDE 100 MG/10ML IV SOLN
INTRAVENOUS | Status: DC | PRN
  Administered 2016-12-23: 20 mg via INTRAVENOUS
  Administered 2016-12-23 (×2): 50 mg via INTRAVENOUS

## 2016-12-23 MED ORDER — SENNA 8.6 MG PO TABS
1.0000 | ORAL_TABLET | Freq: Two times a day (BID) | ORAL | Status: DC
  Administered 2016-12-23 – 2016-12-25 (×5): 8.6 mg via ORAL
  Filled 2016-12-23 (×5): qty 1

## 2016-12-23 MED ORDER — ONDANSETRON HCL 4 MG/2ML IJ SOLN
INTRAMUSCULAR | Status: DC | PRN
  Administered 2016-12-23: 4 mg via INTRAVENOUS

## 2016-12-23 MED ORDER — POTASSIUM CHLORIDE IN NACL 20-0.9 MEQ/L-% IV SOLN
INTRAVENOUS | Status: DC
  Administered 2016-12-23 – 2016-12-25 (×2): via INTRAVENOUS
  Filled 2016-12-23 (×3): qty 1000

## 2016-12-23 MED ORDER — MORPHINE SULFATE (PF) 2 MG/ML IV SOLN
2.0000 mg | INTRAVENOUS | Status: DC | PRN
  Administered 2016-12-24: 2 mg via INTRAVENOUS
  Filled 2016-12-23 (×2): qty 1

## 2016-12-23 MED ORDER — SODIUM CHLORIDE 0.9% FLUSH
3.0000 mL | Freq: Two times a day (BID) | INTRAVENOUS | Status: DC
Start: 2016-12-23 — End: 2016-12-26
  Administered 2016-12-23 – 2016-12-25 (×5): 3 mL via INTRAVENOUS

## 2016-12-23 MED ORDER — LACTATED RINGERS IV SOLN
INTRAVENOUS | Status: DC
  Administered 2016-12-23 (×3): via INTRAVENOUS

## 2016-12-23 MED ORDER — MENTHOL 3 MG MT LOZG: 1.0000 | LOZENGE | OROMUCOSAL | Status: DC | PRN

## 2016-12-23 MED ORDER — HYDROMORPHONE HCL 1 MG/ML IJ SOLN: 0.2500 mg | INTRAMUSCULAR | Status: DC | PRN

## 2016-12-23 MED ORDER — VANCOMYCIN HCL IN DEXTROSE 1-5 GM/200ML-% IV SOLN
INTRAVENOUS | Status: AC
  Filled 2016-12-23: qty 200

## 2016-12-23 MED ORDER — DEXAMETHASONE SODIUM PHOSPHATE 10 MG/ML IJ SOLN
10.0000 mg | INTRAMUSCULAR | Status: AC
  Administered 2016-12-23: 10 mg via INTRAVENOUS
  Filled 2016-12-23: qty 1

## 2016-12-23 MED ORDER — METHOCARBAMOL 500 MG PO TABS
500.0000 mg | ORAL_TABLET | Freq: Four times a day (QID) | ORAL | Status: DC | PRN
  Administered 2016-12-23 – 2016-12-24 (×3): 500 mg via ORAL
  Filled 2016-12-23 (×3): qty 1

## 2016-12-23 MED ORDER — PHENOL 1.4 % MT LIQD: 1.0000 | OROMUCOSAL | Status: DC | PRN

## 2016-12-23 MED ORDER — CELECOXIB 200 MG PO CAPS
200.0000 mg | ORAL_CAPSULE | Freq: Two times a day (BID) | ORAL | Status: DC
  Administered 2016-12-23 – 2016-12-25 (×5): 200 mg via ORAL
  Filled 2016-12-23 (×6): qty 1

## 2016-12-23 MED ORDER — ENALAPRIL MALEATE 5 MG PO TABS
10.0000 mg | ORAL_TABLET | Freq: Every day | ORAL | Status: DC
  Administered 2016-12-23 – 2016-12-26 (×4): 10 mg via ORAL
  Filled 2016-12-23 (×4): qty 2

## 2016-12-23 MED ORDER — BACITRACIN ZINC 500 UNIT/GM EX OINT
TOPICAL_OINTMENT | CUTANEOUS | Status: DC | PRN
  Administered 2016-12-23: 1 via TOPICAL

## 2016-12-23 SURGICAL SUPPLY — 72 items
ADH SKN CLS APL DERMABOND .7 (GAUZE/BANDAGES/DRESSINGS) ×1
APL SKNCLS STERI-STRIP NONHPOA (GAUZE/BANDAGES/DRESSINGS) ×1
BAG DECANTER FOR FLEXI CONT (MISCELLANEOUS) ×3 IMPLANT
BENZOIN TINCTURE PRP APPL 2/3 (GAUZE/BANDAGES/DRESSINGS) ×3 IMPLANT
BIT DRILL 3.5 SHORT (BIT) ×1 IMPLANT
BIT DRILL 3.5MM SHORT (BIT) ×1
BIT DRILL 4.0 SHORT (BIT) ×1 IMPLANT
BLADE CLIPPER SURG (BLADE) IMPLANT
BONE MATRIX OSTEOCEL PRO MED (Bone Implant) ×2 IMPLANT
BUR MATCHSTICK NEURO 3.0 LAGG (BURR) ×3 IMPLANT
CANISTER SUCT 3000ML PPV (MISCELLANEOUS) ×3 IMPLANT
CAP LOCKING (Cap) IMPLANT
CARTRIDGE OIL MAESTRO DRILL (MISCELLANEOUS) ×1 IMPLANT
CLOSURE WOUND 1/2 X4 (GAUZE/BANDAGES/DRESSINGS) ×1
DERMABOND ADVANCED (GAUZE/BANDAGES/DRESSINGS) ×2
DERMABOND ADVANCED .7 DNX12 (GAUZE/BANDAGES/DRESSINGS) IMPLANT
DIFFUSER DRILL AIR PNEUMATIC (MISCELLANEOUS) ×3 IMPLANT
DRAPE C-ARM 42X72 X-RAY (DRAPES) ×10 IMPLANT
DRAPE LAPAROTOMY 100X72 PEDS (DRAPES) ×3 IMPLANT
DRAPE POUCH INSTRU U-SHP 10X18 (DRAPES) ×3 IMPLANT
DRSG OPSITE POSTOP 4X6 (GAUZE/BANDAGES/DRESSINGS) ×2 IMPLANT
DRSG TEGADERM 4X4.75 (GAUZE/BANDAGES/DRESSINGS) ×2 IMPLANT
DURAPREP 6ML APPLICATOR 50/CS (WOUND CARE) ×3 IMPLANT
ELECT REM PT RETURN 9FT ADLT (ELECTROSURGICAL) ×3
ELECTRODE REM PT RTRN 9FT ADLT (ELECTROSURGICAL) ×1 IMPLANT
EVACUATOR 1/8 PVC DRAIN (DRAIN) ×2 IMPLANT
GAUZE SPONGE 4X4 12PLY STRL (GAUZE/BANDAGES/DRESSINGS) ×3 IMPLANT
GAUZE SPONGE 4X4 16PLY XRAY LF (GAUZE/BANDAGES/DRESSINGS) IMPLANT
GLOVE BIO SURGEON STRL SZ7 (GLOVE) IMPLANT
GLOVE BIO SURGEON STRL SZ8 (GLOVE) ×5 IMPLANT
GLOVE BIO SURGEON STRL SZ8.5 (GLOVE) ×2 IMPLANT
GLOVE BIOGEL PI IND STRL 7.0 (GLOVE) IMPLANT
GLOVE BIOGEL PI INDICATOR 7.0 (GLOVE)
GLOVE EXAM NITRILE LRG STRL (GLOVE) IMPLANT
GLOVE EXAM NITRILE XL STR (GLOVE) IMPLANT
GLOVE EXAM NITRILE XS STR PU (GLOVE) IMPLANT
GOWN STRL REUS W/ TWL LRG LVL3 (GOWN DISPOSABLE) IMPLANT
GOWN STRL REUS W/ TWL XL LVL3 (GOWN DISPOSABLE) ×1 IMPLANT
GOWN STRL REUS W/TWL 2XL LVL3 (GOWN DISPOSABLE) IMPLANT
GOWN STRL REUS W/TWL LRG LVL3 (GOWN DISPOSABLE)
GOWN STRL REUS W/TWL XL LVL3 (GOWN DISPOSABLE) ×3
HEMOSTAT POWDER KIT SURGIFOAM (HEMOSTASIS) ×2 IMPLANT
IMPL QUARTEX 3.5X14MM (Neuro Prosthesis/Implant) IMPLANT
IMPLANT QUARTEX 3.5X14MM (Neuro Prosthesis/Implant) ×18 IMPLANT
KIT BASIN OR (CUSTOM PROCEDURE TRAY) ×3 IMPLANT
KIT ROOM TURNOVER OR (KITS) ×3 IMPLANT
LOCKING CAP (Cap) ×30 IMPLANT
MARKER SKIN DUAL TIP RULER LAB (MISCELLANEOUS) ×3 IMPLANT
NDL HYPO 18GX1.5 BLUNT FILL (NEEDLE) IMPLANT
NDL HYPO 25X1 1.5 SAFETY (NEEDLE) ×1 IMPLANT
NDL SPNL 20GX3.5 QUINCKE YW (NEEDLE) ×1 IMPLANT
NEEDLE HYPO 18GX1.5 BLUNT FILL (NEEDLE) IMPLANT
NEEDLE HYPO 25X1 1.5 SAFETY (NEEDLE) ×3 IMPLANT
NEEDLE SPNL 20GX3.5 QUINCKE YW (NEEDLE) ×3 IMPLANT
NS IRRIG 1000ML POUR BTL (IV SOLUTION) ×3 IMPLANT
OIL CARTRIDGE MAESTRO DRILL (MISCELLANEOUS) ×3
PACK LAMINECTOMY NEURO (CUSTOM PROCEDURE TRAY) ×3 IMPLANT
PIN MAYFIELD SKULL DISP (PIN) ×3 IMPLANT
ROD SPINE STR 4.0X80 (Rod) ×4 IMPLANT
SCREW POLYAXIAL 4.0X22MM (Screw) ×8 IMPLANT
SPONGE SURGIFOAM ABS GEL 100 (HEMOSTASIS) ×3 IMPLANT
STRIP BIOACTIVE 20CC 25X100X8 (Miscellaneous) ×2 IMPLANT
STRIP CLOSURE SKIN 1/2X4 (GAUZE/BANDAGES/DRESSINGS) ×2 IMPLANT
SUT VIC AB 0 CT1 18XCR BRD8 (SUTURE) ×1 IMPLANT
SUT VIC AB 0 CT1 8-18 (SUTURE) ×3
SUT VIC AB 2-0 CP2 18 (SUTURE) ×3 IMPLANT
SUT VIC AB 3-0 SH 8-18 (SUTURE) ×4 IMPLANT
TOWEL GREEN STERILE (TOWEL DISPOSABLE) ×3 IMPLANT
TOWEL GREEN STERILE FF (TOWEL DISPOSABLE) ×2 IMPLANT
TRAY FOLEY W/METER SILVER 16FR (SET/KITS/TRAYS/PACK) IMPLANT
UNDERPAD 30X30 (UNDERPADS AND DIAPERS) ×3 IMPLANT
WATER STERILE IRR 1000ML POUR (IV SOLUTION) ×3 IMPLANT

## 2016-12-23 NOTE — Anesthesia Procedure Notes (Signed)
Procedure Name: Intubation Date/Time: 12/23/2016 3:15 PM Performed by: Scheryl Darter Pre-anesthesia Checklist: Patient identified, Emergency Drugs available, Suction available and Patient being monitored Patient Re-evaluated:Patient Re-evaluated prior to inductionOxygen Delivery Method: Circle System Utilized Preoxygenation: Pre-oxygenation with 100% oxygen Intubation Type: IV induction Ventilation: Mask ventilation without difficulty Laryngoscope Size: Glidescope and 4 Grade View: Grade I Tube type: Oral Tube size: 8.0 mm Number of attempts: 1 Airway Equipment and Method: Stylet Placement Confirmation: ETT inserted through vocal cords under direct vision,  positive ETCO2 and breath sounds checked- equal and bilateral Tube secured with: Tape Dental Injury: Teeth and Oropharynx as per pre-operative assessment

## 2016-12-23 NOTE — H&P (Signed)
Subjective:   Patient is a 61 y.o. male admitted for Posterior cervical fusion. The patient first presented to me with complaints of neck pain, arm pain, shooting pains in the arm(s), numbness of the arm(s) and loss of strength of the arm(s). This was in the emergency department and he was found to have cervical epidural abscess at C6-7. He underwent ACDF with plating at C6-7. He has been on antibiotics since surgery. Infectious disease has managed his antibiotic course. Onset of symptoms was a few days ago. The pain is described as aching and occurs intermittently. The pain is rated mild, and is located in the neck. The symptoms have not been progressive. Plain films showed subsidence of the allograft into the inferior part of C6 with developing kyphosis. MRI and CT scan were done. Recommended posterior cervical instrumented stabilization.  Past Medical History:  Diagnosis Date  . Anemia, unspecified 09/06/2013  . CKD (chronic kidney disease)    on chronic low dose Prednisone  . Dermatitis   . Eczema   . HTN (hypertension)   . Hyperlipidemia   . ITP (idiopathic thrombocytopenic purpura) 2011   on chronic low dose Prednisone  . 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 ANTERIOR CERVICAL DECOMPRESSION FOR EPIDURAL ABSCESS N/A 11/17/2016   Procedure: ANTERIOR CERVICAL DECOMPRESSION FOR EPIDURAL ABSCESS CERVICAL SIX-SEVEN;  Surgeon: Eustace Moore, MD;  Location: [place];  Service: Neurosurgery;  Laterality: N/A;  . CERVICAL WOUND DEBRIDEMENT N/A 11/26/2016   Procedure: CERVICAL WOUND EXPLORATION, IRRIGATION DEBRIDEMENT;  Surgeon: Kevan Ny Ditty, MD;  Location: [place];  Service: Neurosurgery;  Laterality: N/A;  . HERNIA REPAIR Right 2006/2007   inguinal (done twice)  . kidney biospy    . TEE WITHOUT CARDIOVERSION N/A 11/29/2016   Procedure: TRANSESOPHAGEAL ECHOCARDIOGRAM (TEE);  Surgeon: Skeet Latch, MD;  Location: [facility] ENDOSCOPY;   Service: Cardiovascular;  Laterality: N/A;    Allergies  Allergen Reactions  . Cefazolin Hives  . Lipitor [Atorvastatin] Palpitations  . Penicillins Rash and Other (See Comments)    Social History  Substance Use Topics  . Smoking status: Former Smoker    Packs/day: 1.00    Years: 30.00    Quit date: 09/06/2010  . Smokeless tobacco: Never Used  . Alcohol use No     Comment: heavy drinker in the past    Family History  Problem Relation Age of Onset  . Cancer Father     prostate ca  . Neuropathy Mother   . Heart disease Mother   . Atrial fibrillation Mother   . Heart failure Mother    Prior to Admission medications   Medication Sig Start Date End Date Taking? Authorizing Provider  amitriptyline (ELAVIL) 25 MG tablet TAKE 1 TABLET BY MOUTH BEFORE BEDTIME 12/06/16  Yes Historical Provider, MD  calcium carbonate (OS-CAL) 600 MG TABS Take 600 mg by mouth daily.   Yes Historical Provider, MD  daptomycin (CUBICIN) IVPB Inject 625 mg into the vein daily.   Yes Historical Provider, MD  enalapril (VASOTEC) 10 MG tablet Take 10 mg by mouth daily.  12/02/11  Yes Historical Provider, MD  hydrOXYzine (ATARAX/VISTARIL) 10 MG tablet Take 1 tablet (10 mg total) by mouth 3 (three) times daily as needed. 12/16/16  Yes Carlyle Basques, MD  oxyCODONE (OXY IR/ROXICODONE) 5 MG immediate release tablet Take 1 tablet (5 mg total) by mouth every 6 (six) hours as needed for breakthrough pain. 11/23/16  Yes Eustace Moore, MD  predniSONE (  DELTASONE) 10 MG tablet Take 4 tablets (40 mg total) by mouth daily with breakfast. X 2 days, then 3 tabs QD x 2 d., Then 2 tabs QD x 3 d., then 1 tab QD 12/16/16  Yes Carlyle Basques, MD  rifampin (RIFADIN) 300 MG capsule Take 1 capsule (300 mg total) by mouth 2 (two) times daily with a meal. 12/01/16 01/22/17 Yes Eustace Moore, MD  tamsulosin (FLOMAX) 0.4 MG CAPS capsule Take 1 capsule (0.4 mg total) by mouth daily after supper. 12/01/16  Yes Eustace Moore, MD  predniSONE (DELTASONE) 5  MG tablet Take 1 tablet (5 mg total) by mouth daily. Patient not taking: Reported on 12/21/2016 01/31/12   Nobie Putnam, MD     Review of Systems  Positive ROS: neg  All other systems have been reviewed and were otherwise negative with the exception of those mentioned in the HPI and as above.  Objective: Vital signs in last 24 hours: Temp:  [98.4 F (36.9 C)] 98.4 F (36.9 C) (03/23 1210) Pulse Rate:  [103] 103 (03/23 1210) Resp:  [18] 18 (03/23 1210) BP: (143)/(90) 143/90 (03/23 1210) SpO2:  [99 %] 99 % (03/23 1210) Weight:  [73.9 kg (162 lb 14.7 oz)] 73.9 kg (162 lb 14.7 oz) (03/23 1210)  General Appearance: Alert, cooperative, no distress, appears stated age Head: Normocephalic, without obvious abnormality, atraumatic Eyes: PERRL, conjunctiva/corneas clear, EOM's intact      Neck: Supple, symmetrical, trachea midline, Back: Symmetric, no curvature, ROM normal, no CVA tenderness Lungs:  respirations unlabored Heart: Regular rate and rhythm Abdomen: Soft, non-tender Extremities: Extremities normal, atraumatic, no cyanosis or edema Pulses: 2+ and symmetric all extremities Skin: Skin color, texture, turgor normal, no rashes or lesions  NEUROLOGIC:  Mental status: Alert and oriented x4, no aphasia, good attention span, fund of knowledge and memory  Motor Exam - grossly normal Sensory Exam - grossly normal Reflexes: 1+ Coordination - grossly normal Gait - grossly normal Balance - grossly normal Cranial Nerves: I: smell Not tested  II: visual acuity  OS: nl    OD: nl  II: visual fields Full to confrontation  II: pupils Equal, round, reactive to light  III,VII: ptosis None  III,IV,VI: extraocular muscles  Full ROM  V: mastication Normal  V: facial light touch sensation  Normal  V,VII: corneal reflex  Present  VII: facial muscle function - upper  Normal  VII: facial muscle function - lower Normal  VIII: hearing Not tested  IX: soft palate elevation  Normal  IX,X: gag  reflex Present  XI: trapezius strength  5/5  XI: sternocleidomastoid strength 5/5  XI: neck flexion strength  5/5  XII: tongue strength  Normal    Data Review Lab Results  Component Value Date   WBC 8.8 12/21/2016   HGB 10.2 (L) 12/21/2016   HCT 31.2 (L) 12/21/2016   MCV 86.7 12/21/2016   PLT 317 12/21/2016   Lab Results  Component Value Date   NA 137 12/21/2016   K 4.2 12/21/2016   CL 101 12/21/2016   CO2 24 12/21/2016   BUN 21 (H) 12/21/2016   CREATININE 1.11 12/21/2016   GLUCOSE 115 (H) 12/21/2016   Lab Results  Component Value Date   INR 1.03 12/21/2016    Assessment:   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 Unfortunately with his C6 osteomyelitis, his bone quality simply failed to hold the construct and therefore he has has developed subsidence of the  allograft at C6 and kyphosis. Patient has failed conservative therapy. Planned surgery : Posterior cervical instrumented fusion C4-T2  Plan:   I explained the condition and procedure to the patient and answered any questions.  Patient wishes to proceed with procedure as planned. Understands risks/ benefits/ and expected or typical outcomes.  Keivon Garden S 12/23/2016 2:54 PM

## 2016-12-23 NOTE — Anesthesia Postprocedure Evaluation (Signed)
Anesthesia Post Note  Patient: Aurther Loft  Procedure(s) Performed: Procedure(s) (LRB): Posterior Cervical Fusion with lateral mass fixation - Cervical Four - Thoracic Two (N/A)  Patient location during evaluation: PACU Anesthesia Type: General Level of consciousness: awake Pain management: pain level controlled Respiratory status: spontaneous breathing Cardiovascular status: stable Anesthetic complications: no       Last Vitals:  Vitals:   12/23/16 1845 12/23/16 1900  BP: (!) 133/99 126/86  Pulse: (!) 108 (!) 107  Resp: 17 14  Temp:      Last Pain:  Vitals:   12/23/16 1900  TempSrc:   PainSc: 0-No pain    LLE Motor Response: Purposeful movement;Responds to commands (12/23/16 1900) LLE Sensation: Full sensation (12/23/16 1900) RLE Motor Response: Purposeful movement;Responds to commands (12/23/16 1900) RLE Sensation: Full sensation (12/23/16 1900)      Jelani Trueba

## 2016-12-23 NOTE — Progress Notes (Signed)
Orthopedic Tech Progress Note Patient Details:  Thomas Nielsen 1955/12/05 [id number] Patient has c-collar Patient ID: Aurther Loft, male   DOB: January 05, 1956, 61 y.o.   MRN: [id number]   Braulio Bosch 12/23/2016, 10:27 PM

## 2016-12-23 NOTE — Progress Notes (Signed)
Patient admitted from PACU. Patient alert and oriented x 4. Patient oriented to room and made comfortable.

## 2016-12-23 NOTE — Addendum Note (Signed)
Addendum  created 12/23/16 1930 by Belinda Block, MD   Order list changed, Order sets accessed

## 2016-12-23 NOTE — Op Note (Signed)
12/23/2016  6:30 PM  PATIENT:  Thomas MCGLASSON  61 y.o. 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 Posterior cervical fusion C4-T2 inclusive of utilizing allograft putty, 2. Segmental Posterior cervical fixation C4-T2 utilizing globus lateral mass fixation from C4-C6 with pedicle screws at T1 and T2, 3. foraminotomies C7-T1 and T1-T2 bilaterally   SURGEON:  Sherley Bounds, MD  ASSISTANTS: Dr. Arnoldo Morale  ANESTHESIA:   General  EBL: 100 ml  Total I/O In: 1000 [I.V.:1000] Out: -   BLOOD ADMINISTERED: none  DRAINS: Medium Hemovac  SPECIMEN:  none  INDICATION FOR PROCEDURE: This patient presented with progressive kyphosis on imaging after ACDF at C6-7 for epidural abscess. He had osteomyelitis and serial x-rays showed progressive subsidence of the C6 vertebral body secondary to softening of the bone from the osteomyelitis. MRI confirmed osteomyelitis at C5-C6 and C7. I recommended a posterior cervical fixation.. Imaging showed osteomyelitis C5-C7. The patient tried conservative measures without relief. Pain was very mild. He had progressive deformity. Recommended Mr. cervical fixation C4-T2. Patient understood the risks, benefits, and alternatives and potential outcomes and wished to proceed.  PROCEDURE DETAILS: The patient was brought to the operating room. Generalized endotracheal anesthesia was induced. The patient was affixed a 3 point [place] headrest and rolled into the prone position on chest rolls. All pressure points were padded. The posterior cervical region was cleaned and prepped with DuraPrep and then draped in the usual sterile fashion. 7 cc of local anesthesia was injected and a dorsal midline incision made in the posterior cervical region and carried down to the cervical fascia. The fascia was opened and the paraspinous musculature was taken down  to expose C4-T2. Intraoperative fluoroscopy confirmed my level and then the dissection was carried out over the lateral facets. I localized the midpoint of each lateral mass and marked a region 1 mm medial to the midpoint of the lateral mass, and then drilled in an upward and outward direction into the safe zone of each lateral mass C4-C6. I drilled to a depth of 14 mm and then checked my drill hole with a ball probe. I then placed a 14 mm lateral mass screws into the safe zone of each lateral mass until they were 2 fingers tight. Performed a keyhole foraminotomies with high-speed drill and Kerrison punches at C7-T1 and T1-T2. I could palpate the pedicles. I used AP and lateral fluoroscopy to determine the entry zone of the T1 and T2 pedicle screws. I used a hand drill to drill to a depth of 22 mm. I palpated with a ball probe. Each felt great. I then placed 22 mm pedicle screws into the pedicles of T1 and T2 bilaterally. I checked these with AP and lateral fluoroscopy. I then decorticated the lateral masses and the facet joints and packed them with morcellized allograft to perform arthrodesis from C4-T2. I then placed rods into the multiaxial screw heads of the screws and locked these into position with the locking caps and anti-torque device. I then checked the final construct with AP/Lat fluoroscopy. I irrigated with saline solution containing bacitracin. I placed a medium Hemovac drain through separate stab incision, and lined the dura with Gelfoam. After hemostasis was achieved I closed the muscle and the fascia with 0 Vicryl, subcutaneous tissue with 2-0 Vicryl, and the subcuticular tissue with 3-0 Vicryl. The skin was closed with benzoin and Steri-Strips. A sterile dressing was applied, the patient  was turned to the supine position and taken out of the headrest, awakened from general anesthesia and transferred to the recovery room in stable condition. At the end of the procedure all sponge, needle and  instrument counts were correct.    PLAN OF CARE: Admit to inpatient   PATIENT DISPOSITION:  PACU - hemodynamically stable.   Delay start of Pharmacological VTE agent (>24hrs) due to surgical blood loss or risk of bleeding:  yes

## 2016-12-23 NOTE — Anesthesia Preprocedure Evaluation (Signed)
Anesthesia Evaluation  Patient identified by MRN, date of birth, ID band Patient awake    Reviewed: Allergy & Precautions, NPO status , Patient's Chart, lab work & pertinent test results  History of Anesthesia Complications Negative for: history of anesthetic complications  Airway Mallampati: III  TM Distance: >3 FB Neck ROM: Full    Dental  (+) Teeth Intact, Dental Advisory Given, Missing   Pulmonary former smoker,    Pulmonary exam normal breath sounds clear to auscultation       Cardiovascular hypertension, Pt. 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  Reproductive/Obstetrics                             Lab Results  Component Value Date   WBC 8.8 12/21/2016   HGB 10.2 (L) 12/21/2016   HCT 31.2 (L) 12/21/2016   MCV 86.7 12/21/2016   PLT 317 12/21/2016   Lab Results  Component Value Date   CREATININE 1.11 12/21/2016   BUN 21 (H) 12/21/2016   NA 137 12/21/2016   K 4.2 12/21/2016   CL 101 12/21/2016   CO2 24 12/21/2016    Anesthesia Physical  Anesthesia Plan  ASA: II  Anesthesia Plan: General   Post-op Pain Management:    Induction: Intravenous  Airway Management Planned: Oral ETT  Additional Equipment:   Intra-op Plan:   Post-operative Plan: Extubation in OR  Informed Consent: I have reviewed the patients History and Physical, chart, labs and discussed the procedure including the risks,  benefits and alternatives for the proposed anesthesia with the patient or authorized representative who has indicated his/her understanding and acceptance.   Dental advisory given  Plan Discussed with: Anesthesiologist, Surgeon and CRNA  Anesthesia Plan Comments:         Anesthesia Quick Evaluation

## 2016-12-23 NOTE — Transfer of Care (Signed)
Immediate Anesthesia Transfer of Care Note  Patient: Thomas Nielsen  Procedure(s) Performed: Procedure(s): Posterior Cervical Fusion with lateral mass fixation - Cervical Four - Thoracic Two (N/A)  Patient Location: PACU  Anesthesia Type:General  Level of Consciousness: awake, alert  and oriented  Airway & Oxygen Therapy: Patient Spontanous Breathing and Patient connected to nasal cannula oxygen  Post-op Assessment: Report given to RN and Post -op Vital signs reviewed and stable  Post vital signs: Reviewed and stable  Last Vitals:  Vitals:   12/23/16 1210 12/23/16 1840  BP: (!) 143/90   Pulse: (!) 103   Resp: 18   Temp: 36.9 C 36.8 C    Last Pain:  Vitals:   12/23/16 1210  TempSrc: Oral         Complications: No apparent anesthesia complications

## 2016-12-24 DIAGNOSIS — L509 Urticaria, unspecified: Secondary | ICD-10-CM

## 2016-12-24 DIAGNOSIS — L409 Psoriasis, unspecified: Secondary | ICD-10-CM

## 2016-12-24 DIAGNOSIS — R000 Tachycardia, unspecified: Secondary | ICD-10-CM

## 2016-12-24 DIAGNOSIS — A4901 Methicillin susceptible Staphylococcus aureus infection, unspecified site: Secondary | ICD-10-CM

## 2016-12-24 LAB — CBC WITH DIFFERENTIAL/PLATELET
Basophils Absolute: 0 10*3/uL (ref 0.0–0.1)
Basophils Relative: 0 %
Eosinophils Absolute: 0.1 10*3/uL (ref 0.0–0.7)
Eosinophils Relative: 1 %
HCT: 25.9 % — ABNORMAL LOW (ref 39.0–52.0)
Hemoglobin: 8.7 g/dL — ABNORMAL LOW (ref 13.0–17.0)
Lymphocytes Relative: 9 %
Lymphs Abs: 0.7 10*3/uL (ref 0.7–4.0)
MCH: 29.4 pg (ref 26.0–34.0)
MCHC: 33.6 g/dL (ref 30.0–36.0)
MCV: 87.5 fL (ref 78.0–100.0)
MONOS PCT: 7 %
Monocytes Absolute: 0.6 10*3/uL (ref 0.1–1.0)
NEUTROS PCT: 83 %
Neutro Abs: 6.4 10*3/uL (ref 1.7–7.7)
Platelets: 208 10*3/uL (ref 150–400)
RBC: 2.96 MIL/uL — ABNORMAL LOW (ref 4.22–5.81)
RDW: 13.8 % (ref 11.5–15.5)
WBC: 7.8 10*3/uL (ref 4.0–10.5)

## 2016-12-24 LAB — C-REACTIVE PROTEIN: CRP: 3 mg/dL — ABNORMAL HIGH (ref <1.0)

## 2016-12-24 LAB — TSH: TSH: 1.888 u[IU]/mL (ref 0.350–4.500)

## 2016-12-24 LAB — SEDIMENTATION RATE: Sed Rate: 70 mm/hr — ABNORMAL HIGH (ref 0–16)

## 2016-12-24 LAB — T4, FREE: Free T4: 0.73 ng/dL (ref 0.61–1.12)

## 2016-12-24 MED ORDER — HYDROCERIN EX CREA
TOPICAL_CREAM | Freq: Two times a day (BID) | CUTANEOUS | Status: DC
  Administered 2016-12-24 – 2016-12-26 (×5): via TOPICAL
  Filled 2016-12-24: qty 113

## 2016-12-24 MED ORDER — PREDNISONE 5 MG PO TABS
10.0000 mg | ORAL_TABLET | Freq: Every day | ORAL | Status: DC
  Administered 2016-12-25 – 2016-12-26 (×2): 10 mg via ORAL
  Filled 2016-12-24 (×2): qty 2

## 2016-12-24 MED ORDER — SODIUM CHLORIDE 0.9% FLUSH
10.0000 mL | INTRAVENOUS | Status: DC | PRN
  Administered 2016-12-26: 10 mL
  Filled 2016-12-24: qty 40

## 2016-12-24 MED ORDER — SODIUM CHLORIDE 0.9% FLUSH
10.0000 mL | Freq: Two times a day (BID) | INTRAVENOUS | Status: DC
  Administered 2016-12-24: 10 mL

## 2016-12-24 NOTE — Progress Notes (Signed)
PT Cancellation Note  Patient Details Name: Thomas Nielsen MRN: [id number] DOB: [birth year]   Cancelled Treatment:    Reason Eval/Treat Not Completed: PT screened, no needs identified, will sign off. Spoke with OT therapist, no acute PT needs, will sign off.   Duncan Dull 12/24/2016, 4:22 PM

## 2016-12-24 NOTE — Evaluation (Signed)
Occupational Therapy Evaluation and Discharge  Patient Details Name: Thomas Nielsen MRN: [id number] DOB: 1955/11/17 Today's Date: 12/24/2016    History of Present Illness Pt is a 61 y.o. male s/p posterior cervical fusion C4-T12 on 12/23/2016. Pt with recent ACDF C6-7 with evacuation of epidural abscess on 11/17/2016. PMHx: CKD, HTN, Hyperlipidemia.   Clinical Impression   Pt reports he required assist with dressing PICC line prior to showers PTA; otherwise was independent. Currently pt overall mod I for ADL and functional mobility. Reviewed all cervical, ADL, and safety education with pt and his sister. Pt planning to d/c home with 24/7 supervision from his sister. No further acute OT needs identified; signing off at this time. Please re-consult if needs change. Thank you for this referral.    Follow Up Recommendations  No OT follow up;Supervision/Assistance - 24 hour    Equipment Recommendations  None recommended by OT    Recommendations for Other Services       Precautions / Restrictions Precautions Precautions: Fall;Cervical Precaution Comments: Reviewed cervical precautions verbally. Required Braces or Orthoses: Cervical Brace Cervical Brace: Soft collar Restrictions Weight Bearing Restrictions: No      Mobility Bed Mobility Overal bed mobility: Modified Independent             General bed mobility comments: Good log roll technique  Transfers Overall transfer level: Modified independent Equipment used: None                  Balance Overall balance assessment: No apparent balance deficits (not formally assessed)                                         ADL either performed or assessed with clinical judgement   ADL Overall ADL's : Modified independent                                       General ADL Comments: Pt able to perfrom toilet transfer and ADL in sitting/standing with proper technique. Pt has equipment that he  needs at home and is familiar with cervical precautions.     Vision         Perception     Praxis      Pertinent Vitals/Pain Pain Assessment: 0-10 Pain Score: 3  Pain Location: neck Pain Descriptors / Indicators: Sore Pain Intervention(s): Monitored during session;Repositioned;Premedicated before session     Hand Dominance Right   Extremity/Trunk Assessment Upper Extremity Assessment Upper Extremity Assessment: Overall WFL for tasks assessed   Lower Extremity Assessment Lower Extremity Assessment: Overall WFL for tasks assessed   Cervical / Trunk Assessment Cervical / Trunk Assessment: Other exceptions Cervical / Trunk Exceptions: s/p cervical sx   Communication Communication Communication: No difficulties   Cognition Arousal/Alertness: Awake/alert Behavior During Therapy: WFL for tasks assessed/performed Overall Cognitive Status: Within Functional Limits for tasks assessed                                     General Comments       Exercises     Shoulder Instructions      Home Living Family/patient expects to be discharged to:: Private residence Living Arrangements: Other relatives (sister) Available Help at Discharge: Family;Available 24 hours/day Type  of Home: House Home Access: [place]: One level     Bathroom Shower/Tub: Teacher, early years/pre: Handicapped height     Home Equipment: Environmental consultant - 2 wheels;Tub bench;Bedside commode   Additional Comments: Pt staying at mothers house with his sister who can provide assist as needed.      Prior Functioning/Environment Level of Independence: Independent  Gait / Transfers Assistance Needed: Independent with ambulation ADL's / Homemaking Assistance Needed: Assist for dressing PICC line prior to shower            OT Problem List:        OT Treatment/Interventions:      OT Goals(Current goals can be found in the care plan section) Acute Rehab OT  Goals Patient Stated Goal: go home OT Goal Formulation: All assessment and education complete, DC therapy  OT Frequency:     Barriers to D/C:            Co-evaluation              End of Session Equipment Utilized During Treatment: Cervical collar Nurse Communication: Mobility status  Activity Tolerance: Patient tolerated treatment well Patient left: in chair;with call bell/phone within reach;with family/visitor present  OT Visit Diagnosis: Muscle weakness (generalized) (M62.81)                Time: 1859-0931 OT Time Calculation (min): 19 min Charges:  OT General Charges $OT Visit: 1 Procedure OT Evaluation $OT Eval Moderate Complexity: 1 Procedure G-Codes:     Edker Punt A. Ulice Brilliant, M.S., OTR/L Pager: [place] 12/24/2016, 4:42 PM

## 2016-12-24 NOTE — Progress Notes (Signed)
Patient ID: Thomas Nielsen, male   DOB: 1956-05-31, 61 y.o.   MRN: [id number] Alert oriented reasonably comfortable with neck Moderate drainage from Hemovac still Mobilizing reasonably well Range stay in place consults [place] Possible discharge thereafter

## 2016-12-24 NOTE — Progress Notes (Addendum)
[facility]    Date of Admission:  12/23/2016   Total days of antibiotics 4 wks        Daptomycin and rifampin           ID: Thomas Nielsen is a 61 y.o. 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 He was initially on cefazolin plus rifampin but developed a significant maculopapular rash from the cephalosporin for which we had to treat him with prednisone taper roughly 2 wks ago. In follow up with Thomas Nielsen he was found to have significant kyphosis from progressive subsidence of the C6 vertebral body secondary to softening of the bone from the osteomyelitis.He recommended a posterior cervical fixation C4-T2, for which he underwent on evening of 3/23.  Active Problems:   S/P cervical spinal fusion   Subjective: Afebrile. He had some neck pain this morning but has been releaved since having pain medicines  ROS: He has noticed that his HR is higher than his baseline in the past month. Has limited exercise so he doesn't necessarily subscribe to shortness of breath or palpitations. Previously having intermittent difficulty raising left arm. No fever, chills, nightsweat, no pruritic rash but skin is scaling  Medications:  . celecoxib  200 mg Oral Q12H  . DAPTOmycin (CUBICIN)  IV  625 mg Intravenous Q24H  . enalapril  10 mg Oral Daily  . rifampin  300 mg Oral BID WC  . senna  1 tablet Oral BID  . sodium chloride flush  10-40 mL Intracatheter Q12H  . sodium chloride flush  3 mL Intravenous Q12H  . tamsulosin  0.4 mg Oral QPC supper    Objective: Vital signs in last 24 hours: Temp:  [97.9 F (36.6 C)-98.6 F (37 C)] 98.6 F (37 C) (03/24 0936) Pulse Rate:  [95-111] 106 (03/24 0936) Resp:  [11-20] 18 (03/24 0936) BP: (120-167)/(78-99) 137/83 (03/24 0936) SpO2:  [93 %-100 %] 97 % (03/24 0936) Weight:  [160 lb 8 oz (72.8 kg)-162 lb 14.7 oz (73.9 kg)] 160 lb 8 oz (72.8 kg) (03/23 2136) Physical  Exam  Constitutional: He is oriented to person, place, and time. He appears well-developed and well-nourished. No distress.  HENT: wearing a soft collar from surgery  Mouth/Throat: Oropharynx is clear and moist. No oropharyngeal exudate.  Cardiovascular: tachycardic rate, regular rhythm and normal heart sounds. Exam reveals no gallop and no friction rub.  No murmur heard.  Pulmonary/Chest: Effort normal and breath sounds normal. No respiratory distress. He has no wheezes.  Abdominal: Soft. Bowel sounds are normal. He exhibits no distension. There is no tenderness.  Lymphadenopathy:  He has no cervical adenopathy.  Neurological: He is alert and oriented to person, place, and time.  Skin: Skin is warm and dry. Diffuse flaking skin but no erythema/hives (which was seen at last office visit) Psychiatric: He has a normal mood and affect. His behavior is normal.     Lab Results Lab Results  Component Value Date   WBC 8.8 12/21/2016   HGB 10.2 (L) 12/21/2016   HCT 31.2 (L) 12/21/2016   MCV 86.7 12/21/2016   PLT 317 12/21/2016   BMET    Component Value Date/Time   NA 137 12/21/2016 1051   NA 141 03/07/2014 1418   K 4.2 12/21/2016 1051   K 4.2 03/07/2014 1418   CL 101 12/21/2016 1051   CL 105 01/25/2013 1122   CO2 24 12/21/2016  1051   CO2 19 (L) 03/07/2014 1418   GLUCOSE 115 (H) 12/21/2016 1051   GLUCOSE 109 03/07/2014 1418   GLUCOSE 109 (H) 01/25/2013 1122   BUN 21 (H) 12/21/2016 1051   BUN 32.5 (H) 03/07/2014 1418   CREATININE 1.11 12/21/2016 1051   CREATININE 1.3 03/07/2014 1418   CALCIUM 10.0 12/21/2016 1051   CALCIUM 9.7 03/07/2014 1418   GFRNONAA >60 12/21/2016 1051   GFRAA >60 12/21/2016 1051   Lab Results  Component Value Date   CRP 32.6 (H) 11/18/2016   Lab Results  Component Value Date   ESRSEDRATE 94 (H) 11/18/2016    Microbiology: - none taken Studies/Results: Dg Cervical Spine 2-3 Views  Result Date: 12/23/2016 CLINICAL DATA:  Status post posterior  cervical spinal fusion with lateral mass fixation at C4-T2. Initial encounter. EXAM: CERVICAL SPINE - 2-3 VIEW COMPARISON:  CT of the cervical spine performed 12/22/2016 FINDINGS: Two fluoroscopic C-arm images are provided from the OR, demonstrating placement of posterior cervicothoracic spinal fusion hardware. This is not fully assessed on provided images. IMPRESSION: Status post posterior cervicothoracic spinal fusion. Electronically Signed   By: Thomas Nielsen M.D.   On: 12/23/2016 18:26   Dg C-arm 61-120 Min  Result Date: 12/23/2016 CLINICAL DATA:  Status post posterior cervical spinal fusion with lateral mass fixation at C4-T2. Initial encounter. EXAM: CERVICAL SPINE - 2-3 VIEW COMPARISON:  CT of the cervical spine performed 12/22/2016 FINDINGS: Two fluoroscopic C-arm images are provided from the OR, demonstrating placement of posterior cervicothoracic spinal fusion hardware. This is not fully assessed on provided images. IMPRESSION: Status post posterior cervicothoracic spinal fusion. Electronically Signed   By: Thomas Nielsen M.D.   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 Recommend that he continues on daptomycin at 8mg /kg/day plus oral rifampin 300mg  bid  - will check inflammatory markers, and cbc and bmp to ensure still within NL and see that sed rate and CRP are trending down   Hives/rash = finishing out his steroid taper. Will place prednisone 10mg  daily x 3 days, then revert back to his home dose of 5mg  daily psoriais- will add eucerin cream BID to help with flaking dermatitis and also is on pred  Tachycardia = will get EKG to see if sinus tach vs SVT. Asymptomatic.appears that it has been going on for the past month. Will also check TSH/FT4. consider check echo to see if making any impact on his EF. May need to start low dose  metoprolol    Thomas Nielsen, [facility] Cell: [phone] Pager: [phone]  12/24/2016, 11:20 AM

## 2016-12-25 DIAGNOSIS — L271 Localized skin eruption due to drugs and medicaments taken internally: Secondary | ICD-10-CM

## 2016-12-25 DIAGNOSIS — L270 Generalized skin eruption due to drugs and medicaments taken internally: Secondary | ICD-10-CM

## 2016-12-25 DIAGNOSIS — L309 Dermatitis, unspecified: Secondary | ICD-10-CM

## 2016-12-25 DIAGNOSIS — N059 Unspecified nephritic syndrome with unspecified morphologic changes: Secondary | ICD-10-CM

## 2016-12-25 DIAGNOSIS — M4622 Osteomyelitis of vertebra, cervical region: Secondary | ICD-10-CM

## 2016-12-25 DIAGNOSIS — R000 Tachycardia, unspecified: Secondary | ICD-10-CM

## 2016-12-25 DIAGNOSIS — T366X5A Adverse effect of rifampicins, initial encounter: Secondary | ICD-10-CM

## 2016-12-25 DIAGNOSIS — G4700 Insomnia, unspecified: Secondary | ICD-10-CM

## 2016-12-25 DIAGNOSIS — D649 Anemia, unspecified: Secondary | ICD-10-CM

## 2016-12-25 DIAGNOSIS — Z7952 Long term (current) use of systemic steroids: Secondary | ICD-10-CM

## 2016-12-25 LAB — CBC WITH DIFFERENTIAL/PLATELET
Basophils Absolute: 0 10*3/uL (ref 0.0–0.1)
Basophils Relative: 0 %
EOS PCT: 0 %
Eosinophils Absolute: 0 10*3/uL (ref 0.0–0.7)
HEMATOCRIT: 32.5 % — AB (ref 39.0–52.0)
Hemoglobin: 10.7 g/dL — ABNORMAL LOW (ref 13.0–17.0)
LYMPHS ABS: 0.8 10*3/uL (ref 0.7–4.0)
LYMPHS PCT: 6 %
MCH: 28.5 pg (ref 26.0–34.0)
MCHC: 32.9 g/dL (ref 30.0–36.0)
MCV: 86.7 fL (ref 78.0–100.0)
MONO ABS: 0.6 10*3/uL (ref 0.1–1.0)
Monocytes Relative: 5 %
Neutro Abs: 11.1 10*3/uL — ABNORMAL HIGH (ref 1.7–7.7)
Neutrophils Relative %: 89 %
Platelets: 236 10*3/uL (ref 150–400)
RBC: 3.75 MIL/uL — ABNORMAL LOW (ref 4.22–5.81)
RDW: 13.7 % (ref 11.5–15.5)
WBC: 12.5 10*3/uL — ABNORMAL HIGH (ref 4.0–10.5)

## 2016-12-25 MED ORDER — AMITRIPTYLINE HCL 25 MG PO TABS
25.0000 mg | ORAL_TABLET | Freq: Every day | ORAL | Status: DC
  Administered 2016-12-25: 25 mg via ORAL
  Filled 2016-12-25: qty 1

## 2016-12-25 MED ORDER — ALUM & MAG HYDROXIDE-SIMETH 200-200-20 MG/5ML PO SUSP
30.0000 mL | Freq: Four times a day (QID) | ORAL | Status: DC | PRN
  Administered 2016-12-25: 30 mL via ORAL
  Filled 2016-12-25: qty 30

## 2016-12-25 MED ORDER — HYDROXYZINE HCL 25 MG PO TABS
25.0000 mg | ORAL_TABLET | Freq: Three times a day (TID) | ORAL | Status: DC
  Administered 2016-12-25 – 2016-12-26 (×3): 25 mg via ORAL
  Filled 2016-12-25 (×3): qty 1

## 2016-12-25 MED ORDER — SODIUM CHLORIDE 0.9 % IV BOLUS (SEPSIS)
500.0000 mL | Freq: Once | INTRAVENOUS | Status: AC
  Administered 2016-12-25: 500 mL via INTRAVENOUS

## 2016-12-25 NOTE — Progress Notes (Signed)
[facility] for [facility]    Date of Admission:  12/23/2016    Active Problems:   S/P cervical spinal fusion MSSA cervical osteo/epidural abscess/bacteremia   Subjective: Afebrile. HR in 60s this morning but range of 100-130s yesterday. He now started to have rash to forehead and itchiness to legs.  Difficulty with sleeping last night (did not take any sleeping pills last night- he usually takes amiltryptiline). It does not appear that lotion was applied to him yesterday  I have reviewed his old records in [place] link to see updates in the last 24hr.  Medications:  . celecoxib  200 mg Oral Q12H  . DAPTOmycin (CUBICIN)  IV  625 mg Intravenous Q24H  . enalapril  10 mg Oral Daily  . hydrocerin   Topical BID  . predniSONE  10 mg Oral Q breakfast  . rifampin  300 mg Oral BID WC  . senna  1 tablet Oral BID  . sodium chloride flush  10-40 mL Intracatheter Q12H  . sodium chloride flush  3 mL Intravenous Q12H  . tamsulosin  0.4 mg Oral QPC supper    Objective: Vital signs in last 24 hours: Temp:  [98.1 F (36.7 C)-98.6 F (37 C)] 98.6 F (37 C) (03/25 0913) Pulse Rate:  [60-139] 60 (03/25 0913) Resp:  [18] 18 (03/25 0913) BP: (106-142)/(80-101) 106/80 (03/25 0913) SpO2:  [98 %-99 %] 98 % (03/25 0913) Physical Exam  Constitutional: He is oriented to person, place, and time. He appears well-developed and well-nourished. No distress. Sitting in the chair, wearing soft c-collar HENT:  Mouth/Throat: Oropharynx is clear and moist. No oropharyngeal exudate.  Cardiovascular: Normal rate, regular rhythm and normal heart sounds. Exam reveals no gallop and no friction rub.  No murmur heard.  Pulmonary/Chest: Effort normal and breath sounds normal. No respiratory distress. He has no wheezes.  Abdominal: Soft. Bowel sounds are normal. He exhibits no distension. There is no tenderness.  Lymphadenopathy:  He has no cervical adenopathy.  Neurological: He is alert and  oriented to person, place, and time.  Skin: Skin is warm and dry. Dry flakiness to arms bilaterally which is unchanged, but he does has scattered hives like macules to his forehead. Legs are flaky as well Psychiatric: He has a normal mood and affect. His behavior is normal.     Lab Results  Recent Labs  12/24/16 1447  WBC 7.8  HGB 8.7*  HCT 25.9*   Liver Panel No results for input(s): PROT, ALBUMIN, AST, ALT, ALKPHOS, BILITOT, BILIDIR, IBILI in the last 72 hours. Sedimentation Rate  Recent Labs  12/24/16 1447  ESRSEDRATE 70*   C-Reactive Protein  Recent Labs  12/24/16 1447  CRP 3.0*    Microbiology: - no cx- Studies/Results: Dg Cervical Spine 2-3 Views  Result Date: 12/23/2016 CLINICAL DATA:  Status post posterior cervical spinal fusion with lateral mass fixation at C4-T2. Initial encounter. EXAM: CERVICAL SPINE - 2-3 VIEW COMPARISON:  CT of the cervical spine performed 12/22/2016 FINDINGS: Two fluoroscopic C-arm images are provided from the OR, demonstrating placement of posterior cervicothoracic spinal fusion hardware. This is not fully assessed on provided images. IMPRESSION: Status post posterior cervicothoracic spinal fusion. Electronically Signed   By: Garald Balding M.D.   On: 12/23/2016 18:26   Dg C-arm 61-120 Min  Result Date: 12/23/2016 CLINICAL DATA:  Status post posterior cervical spinal fusion with lateral mass fixation at C4-T2. Initial encounter. EXAM: CERVICAL SPINE - 2-3 VIEW COMPARISON:  CT of the cervical  spine performed 12/22/2016 FINDINGS: Two fluoroscopic C-arm images are provided from the OR, demonstrating placement of posterior cervicothoracic spinal fusion hardware. This is not fully assessed on provided images. IMPRESSION: Status post posterior cervicothoracic spinal fusion. Electronically Signed   By: Garald Balding M.D.   On: 12/23/2016 18:26   I have reviewed his EKG, HR at 100s, sinus tachycardia noted.  Assessment/Plan: Presumed drug rash =  he has some scattered lesions to his forehead which were not there yesterday. I am going to stop his rifampin as possible cause. I would like to continue him on the daptomycin.  -will check cbc with diff to see if eos - will schedule hydroxyzine to help with symptoms  Eczema = will make sure that nursing staff applies lotion to his extremities and torso to see if it helps. Will need a new derm referral for management  Insomnia = will reinstitute amyltriptiline 25mg  QHS  Nephrotic syndrome on chronic prednisone 5mg  daily = he is followed by dr Florene Glen. Currently on pred 5mg  daily.   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 Recommend that he continues on daptomycin at 8mg /kg/day   - will check CK  - sed rate and CRP are trending down   Hives/rash = will keep on pred 10mg  for a few more days. If rash is worse,  May need to determine other casue.  Will place prednisone 10mg  daily x 3 days, then revert back to his home dose of 5mg  daily - will add eucerin cream BID to help with flaking dermatitis and also is on pred  Sinus Tachycardia =  EKG showed sinus tach vs SVT.thyroid function is normal. He appears euvolemic though he has some anemia with hgb 8.6 where he came in at hgb 10 and his baselin likely closer to 11-12 looking back at his records.. consider check echo to see if making any impact on his EF.   Anemia = likely from surgery. Will check cbc to see that anemia it is not worsening  Dr Megan Salon to see him tomorrow for further recs  [place], [facility] Cell: [phone] Pager: [phone]  12/25/2016, 11:13 AM

## 2016-12-25 NOTE — Progress Notes (Signed)
Patient has lower BP and increased heart rate; denies any discomfort; states his appetite is decreased; Dr.Snider called ; orders received for fluid bolus and continue to monitor BP; labs reviewed with MD.

## 2016-12-25 NOTE — Progress Notes (Signed)
Pt vomited small brown liquid. Gave zofran IV. Pt nausea relieved.

## 2016-12-25 NOTE — Progress Notes (Signed)
Patient ID: Thomas Nielsen, male   DOB: 11-09-1955, 61 y.o.   MRN: [id number] Subjective:  The patient is alert and pleasant. His neck is appropriately sore. His Hemovac fell out today.  Objective: Vital signs in last 24 hours: Temp:  [98.1 F (36.7 C)-98.6 F (37 C)] 98.6 F (37 C) (03/25 0913) Pulse Rate:  [60-139] 60 (03/25 0913) Resp:  [18] 18 (03/25 0913) BP: (106-142)/(80-101) 106/80 (03/25 0913) SpO2:  [98 %-99 %] 98 % (03/25 0913)  Intake/Output from previous day: 03/24 0701 - 03/25 0700 In: 480 [P.O.:480] Out: 205 [Drains:205] Intake/Output this shift: Total I/O In: 240 [P.O.:240] Out: -   Physical exam the patient is alert and pleasant. He is moving all 4 extremities well.  Lab Results:  Recent Labs  12/24/16 1447  WBC 7.8  HGB 8.7*  HCT 25.9*  PLT 208   BMET No results for input(s): NA, K, CL, CO2, GLUCOSE, BUN, CREATININE, CALCIUM in the last 72 hours.  Studies/Results: Dg Cervical Spine 2-3 Views  Result Date: 12/23/2016 CLINICAL DATA:  Status post posterior cervical spinal fusion with lateral mass fixation at C4-T2. Initial encounter. EXAM: CERVICAL SPINE - 2-3 VIEW COMPARISON:  CT of the cervical spine performed 12/22/2016 FINDINGS: Two fluoroscopic C-arm images are provided from the OR, demonstrating placement of posterior cervicothoracic spinal fusion hardware. This is not fully assessed on provided images. IMPRESSION: Status post posterior cervicothoracic spinal fusion. Electronically Signed   By: Garald Balding M.D.   On: 12/23/2016 18:26   Dg C-arm 61-120 Min  Result Date: 12/23/2016 CLINICAL DATA:  Status post posterior cervical spinal fusion with lateral mass fixation at C4-T2. Initial encounter. EXAM: CERVICAL SPINE - 2-3 VIEW COMPARISON:  CT of the cervical spine performed 12/22/2016 FINDINGS: Two fluoroscopic C-arm images are provided from the OR, demonstrating placement of posterior cervicothoracic spinal fusion hardware. This is not fully assessed  on provided images. IMPRESSION: Status post posterior cervicothoracic spinal fusion. Electronically Signed   By: Garald Balding M.D.   On: 12/23/2016 18:26    Assessment/Plan: Postop day #3: We will mobilize the patient. He will likely go home in the next day or 2 with home IV antibiotics.  LOS: 2 days     Sanna Porcaro D 12/25/2016, 11:28 AM

## 2016-12-26 DIAGNOSIS — Z820 Family history of epilepsy and other diseases of the nervous system: Secondary | ICD-10-CM

## 2016-12-26 DIAGNOSIS — Z881 Allergy status to other antibiotic agents status: Secondary | ICD-10-CM

## 2016-12-26 DIAGNOSIS — Z888 Allergy status to other drugs, medicaments and biological substances status: Secondary | ICD-10-CM

## 2016-12-26 DIAGNOSIS — Z8249 Family history of ischemic heart disease and other diseases of the circulatory system: Secondary | ICD-10-CM

## 2016-12-26 DIAGNOSIS — R197 Diarrhea, unspecified: Secondary | ICD-10-CM

## 2016-12-26 DIAGNOSIS — Z880 Allergy status to penicillin: Secondary | ICD-10-CM

## 2016-12-26 DIAGNOSIS — Z8042 Family history of malignant neoplasm of prostate: Secondary | ICD-10-CM

## 2016-12-26 DIAGNOSIS — M4622 Osteomyelitis of vertebra, cervical region: Principal | ICD-10-CM

## 2016-12-26 DIAGNOSIS — R21 Rash and other nonspecific skin eruption: Secondary | ICD-10-CM

## 2016-12-26 DIAGNOSIS — B9562 Methicillin resistant Staphylococcus aureus infection as the cause of diseases classified elsewhere: Secondary | ICD-10-CM

## 2016-12-26 DIAGNOSIS — Z981 Arthrodesis status: Secondary | ICD-10-CM

## 2016-12-26 DIAGNOSIS — Z87891 Personal history of nicotine dependence: Secondary | ICD-10-CM

## 2016-12-26 MED ORDER — OXYCODONE HCL 5 MG PO TABS: 5.0000 mg | ORAL_TABLET | Freq: Four times a day (QID) | ORAL | 0 refills | Status: DC | PRN

## 2016-12-26 MED ORDER — DAPTOMYCIN IV (FOR PTA / DISCHARGE USE ONLY): 625.0000 mg | INTRAVENOUS | 0 refills | Status: DC

## 2016-12-26 NOTE — Discharge Summary (Signed)
Physician Discharge Summary  Patient ID: Thomas Nielsen MRN: [id number] DOB/AGE: December 12, 1955 61 y.o.  Admit date: 12/23/2016 Discharge date: 12/26/2016  Admission Diagnoses: Osteomyelitis with kyphosis    Discharge Diagnoses: Same   Discharged Condition: stable  Hospital Course: The patient was admitted on 12/23/2016 and taken to the operating room where the patient underwent posterior cervical fusion C4-T2. The patient tolerated the procedure well and was taken to the recovery room and then to the floor in stable condition. The hospital course was routine. There were no complications. The wound remained clean dry and intact. Pt had appropriate neck soreness. No complaints of arm pain or new N/T/W. The patient remained afebrile with stable vital signs, and tolerated a regular diet. The patient continued to increase activities, and pain was well controlled with oral pain medications.   Consults: ID  Significant Diagnostic Studies:  Results for orders placed or performed during the hospital encounter of 12/23/16  CBC with Differential/Platelet  Result Value Ref Range   WBC 7.8 4.0 - 10.5 K/uL   RBC 2.96 (L) 4.22 - 5.81 MIL/uL   Hemoglobin 8.7 (L) 13.0 - 17.0 g/dL   HCT 25.9 (L) 39.0 - 52.0 %   MCV 87.5 78.0 - 100.0 fL   MCH 29.4 26.0 - 34.0 pg   MCHC 33.6 30.0 - 36.0 g/dL   RDW 13.8 11.5 - 15.5 %   Platelets 208 150 - 400 K/uL   Neutrophils Relative % 83 %   Neutro Abs 6.4 1.7 - 7.7 K/uL   Lymphocytes Relative 9 %   Lymphs Abs 0.7 0.7 - 4.0 K/uL   Monocytes Relative 7 %   Monocytes Absolute 0.6 0.1 - 1.0 K/uL   Eosinophils Relative 1 %   Eosinophils Absolute 0.1 0.0 - 0.7 K/uL   Basophils Relative 0 %   Basophils Absolute 0.0 0.0 - 0.1 K/uL  Sedimentation rate  Result Value Ref Range   Sed Rate 70 (H) 0 - 16 mm/hr  C-reactive protein  Result Value Ref Range   CRP 3.0 (H) <1.0 mg/dL  TSH  Result Value Ref Range   TSH 1.888 0.350 - 4.500 uIU/mL  T4, free  Result Value Ref  Range   Free T4 0.73 0.61 - 1.12 ng/dL  CBC with Differential/Platelet  Result Value Ref Range   WBC 12.5 (H) 4.0 - 10.5 K/uL   RBC 3.75 (L) 4.22 - 5.81 MIL/uL   Hemoglobin 10.7 (L) 13.0 - 17.0 g/dL   HCT 32.5 (L) 39.0 - 52.0 %   MCV 86.7 78.0 - 100.0 fL   MCH 28.5 26.0 - 34.0 pg   MCHC 32.9 30.0 - 36.0 g/dL   RDW 13.7 11.5 - 15.5 %   Platelets 236 150 - 400 K/uL   Neutrophils Relative % 89 %   Neutro Abs 11.1 (H) 1.7 - 7.7 K/uL   Lymphocytes Relative 6 %   Lymphs Abs 0.8 0.7 - 4.0 K/uL   Monocytes Relative 5 %   Monocytes Absolute 0.6 0.1 - 1.0 K/uL   Eosinophils Relative 0 %   Eosinophils Absolute 0.0 0.0 - 0.7 K/uL   Basophils Relative 0 %   Basophils Absolute 0.0 0.0 - 0.1 K/uL    Dg Cervical Spine 2-3 Views  Result Date: 12/23/2016 CLINICAL DATA:  Status post posterior cervical spinal fusion with lateral mass fixation at C4-T2. Initial encounter. EXAM: CERVICAL SPINE - 2-3 VIEW COMPARISON:  CT of the cervical spine performed 12/22/2016 FINDINGS: Two fluoroscopic C-arm images are provided  from the OR, demonstrating placement of posterior cervicothoracic spinal fusion hardware. This is not fully assessed on provided images. IMPRESSION: Status post posterior cervicothoracic spinal fusion. Electronically Signed   By: Garald Balding M.D.   On: 12/23/2016 18:26   Ct Cervical Spine Wo Contrast  Result Date: 12/22/2016 CLINICAL DATA:  61 year old male with progressive spinal infection since February. Status post ACDF at C6-C7 which was the initial site of infection. Osteomyelitis now at C5. Evidence of C6 subsidence on recent radiographs. Preoperative planning. EXAM: CT CERVICAL SPINE WITHOUT CONTRAST TECHNIQUE: Multidetector CT imaging of the cervical spine was performed without intravenous contrast. Multiplanar CT image reconstructions were also generated. COMPARISON:  Cervical spine MRI 12/20/2016. Cervical spine radiographs 12/20/2016. Postoperative neck CT 11/26/2016. FINDINGS:  Alignment: Destruction of the anterior C6 vertebral body since 11/26/2016 with 2-3 mm of new retropulsion of the posterior C6 body relative to C7. C5-C6 vertebral alignment remain stable, with subtle retrolisthesis of C5 appearing unchanged. Stable cervical vertebral height and alignment elsewhere. Skull base and vertebrae: Fragmentation and lucency along the course of the bilateral C6 cortical screws (series 3, image 54. This C6 pedicles remain intact. The C7 vertebral body appears osteopenic compared to the February neck CT, but remains intact. There is subtle lucency along the right C7 cortical screw suspicious for loosening. See coronal image 15. Mild destruction of the C5 inferior endplate when compared to the February CT (series 7, image 30). The C5 pedicles and posterior elements remain intact. No C5 spinous process destruction. Other cervical and visible upper thoracic levels appear intact. Bone mineralization is mildly decreased in general since the prior CT. Soft tissues and spinal canal: Better demonstrated on the recent MRI including edema within the bilateral longus coli muscles, small retropharyngeal effusion, and abnormal interspinous enhancement in the lower cervical spine. Negative visible noncontrast posterior fossa. Disc levels:  See cervical spine MRI 12/20/2016. Upper chest: Negative lung apices. IMPRESSION: 1. Destruction of the anterior C6 vertebral body since the February CT with fragmentation and lucency along the course of both C6 cortical screws. 2-3 mm of new retropulsion of the posterior C6 body relative to C7. The C6 pedicles and posterior elements remain intact. 2. C7 appears intact but there is new lucency along right C7 cortical screw. 3. Mild erosion of the inferior C5 endplate since February, but otherwise C5 is intact. 4. Other cervical and upper thoracic levels are stable. 5. See the recent cervical spine MRI on 12/20/2016 regarding other spinal and paraspinal soft tissue  abnormalities. Electronically Signed   By: Genevie Ann M.D.   On: 12/22/2016 13:29   Mr Cervical Spine W Wo Contrast  Addendum Date: 12/21/2016   ADDENDUM REPORT: 12/21/2016 09:31 ADDENDUM: Study discussed by telephone with Dr. Sherley Bounds on 12/21/2016 at 0915 hours. We also discussed findings on the recent lateral cervical spine radiograph performed at his office, including subsidence of the interbody implant material into the inferior C6 body, and developing kyphosis at that level. Electronically Signed   By: Genevie Ann M.D.   On: 12/21/2016 09:31   Result Date: 12/21/2016 CLINICAL DATA:  61 year old male with spinal infection including discitis and C6-C7 ventral epidural abscess and spinal stenosis in February. Status post C6-C7 ACDF on 11/17/2016, and cervical wound debridement on 11/26/2016. Wound and blood cultures grew MSSA. Subsequent encounter. EXAM: MRI CERVICAL SPINE WITHOUT AND WITH CONTRAST TECHNIQUE: Multiplanar and multiecho pulse sequences of the cervical spine, to include the craniocervical junction and cervicothoracic junction, were obtained without and with intravenous  contrast. CONTRAST:  63mL MULTIHANCE GADOBENATE DIMEGLUMINE 529 MG/ML IV SOLN COMPARISON:  [place] [facility] cervical spine radiograph 12/20/2016. Preoperative cervical spine MRI 11/17/2016. FINDINGS: Alignment: Mild retrolisthesis at the C6 level is increased since the preoperative MRI. Straightening of lordosis elsewhere. Vertebrae: Susceptibility artifact at the C6-C7 level from ACDF hardware, but confluent abnormal enhancement an marrow edema now in the C5 level, which had normal marrow signal on 11/17/2016. Suspicion of abnormal enhancement in the C6 vertebral body. The C5-C6 disc space appears largely obliterated. There is C6 and C7 pedicle marrow edema. There is also mild marrow edema in the C5 spinous process (series 2, image 7). The other posterior elements in the lower cervical spine appear to be spared. See paraspinal  soft tissue findings below. Cord: Spinal cord signal is within normal limits at all visualized levels. The widespread cervical and upper thoracic dural thickening and enhancement has largely resolved since February. There is mild residual dural thickening and enhancement at the C5 and C6 levels (see below). Posterior Fossa, vertebral arteries, paraspinal tissues: Confluent abnormal edema and enhancement of the cervical longus coli muscles from the C3-C4 level inferiorly. There is a small associated prevertebral fluid collection measuring about 6 mm in thickness maximal anterior to the C4-C5 disc space. At the C5 vertebral body level the abnormal prevertebral muscular enhancement is inseparable from abnormal vertebral body enhancement is seen on series 9, image 15. Furthermore there is abnormal interspinous space soft tissue edema and enhancement at C4-C5 and C5-C6 (series 8, image 7 and series 2, image 7). Major vascular flow voids in the neck are preserved. Cervicomedullary junction is within normal limits. Negative visualized brain parenchyma. Disc levels: The C5-C6 disc space has been largely lost since the February MRI. There is mild abnormal circumferential dural or epidural thickening and enhancement at the C5 and C6 vertebral levels (e.g. Series 9, image 17). There is associated spinal stenosis with up to mild spinal cord mass effect at C5 and C6. There is no epidural fluid collection. IMPRESSION: 1. Interval spread of infection to the C5 vertebral level. C5 - and very likely also C6 - osteomyelitis with circumferential dural/epidural thickening and enhancement, and loss of the C5-C6 disc space. Confluent prevertebral myositis from the C3-C4 level to the ACDF hardware. Abnormal enhancement in the C5 spinous process and the interspinous spaces at C4-C5 and C5-C6 may also be infectious. 2. Associated mild spinal stenosis at C5 and C6 but no spinal cord signal abnormality. 3. Prevertebral fluid up to 6 mm in  thickness could be pus or reactive effusion. No epidural abscess. 4. Noncontrast Cervical Spine CT to evaluate the hardware status at C6-C7 may be complementary for treatment planning. Electronically Signed: By: Genevie Ann M.D. On: 12/21/2016 08:55   Dg C-arm 61-120 Min  Result Date: 12/23/2016 CLINICAL DATA:  Status post posterior cervical spinal fusion with lateral mass fixation at C4-T2. Initial encounter. EXAM: CERVICAL SPINE - 2-3 VIEW COMPARISON:  CT of the cervical spine performed 12/22/2016 FINDINGS: Two fluoroscopic C-arm images are provided from the OR, demonstrating placement of posterior cervicothoracic spinal fusion hardware. This is not fully assessed on provided images. IMPRESSION: Status post posterior cervicothoracic spinal fusion. Electronically Signed   By: Garald Balding M.D.   On: 12/23/2016 18:26    Antibiotics:  Anti-infectives    Start     Dose/Rate Route Frequency Ordered Stop   12/24/16 0800  rifampin (RIFADIN) capsule 300 mg  Status:  Discontinued     300 mg Oral 2 times  daily with meals 12/23/16 2144 12/25/16 1212   12/24/16 0800  DAPTOmycin (CUBICIN) 625 mg in sodium chloride 0.9 % IVPB     625 mg 225 mL/hr over 30 Minutes Intravenous Every 24 hours 12/23/16 2151     12/23/16 2145  daptomycin (CUBICIN) IVPB  Status:  Discontinued     625 mg Intravenous Every 24 hours 12/23/16 2144 12/23/16 2150   12/23/16 1614  bacitracin 50,000 Units in sodium chloride irrigation 0.9 % 500 mL irrigation  Status:  Discontinued       As needed 12/23/16 1615 12/23/16 1835   12/23/16 1246  vancomycin (VANCOCIN) 1-5 GM/200ML-% IVPB  Status:  Discontinued    Comments:  Starleen Arms   : cabinet override      12/23/16 1246 12/23/16 1309   12/23/16 0627  vancomycin (VANCOCIN) IVPB 1000 mg/200 mL premix     1,000 mg 200 mL/hr over 60 Minutes Intravenous On call to O.R. 12/23/16 0627 12/23/16 1600      Discharge Exam: Blood pressure 112/83, pulse (!) 125, temperature 98.1 F (36.7 C),  temperature source Oral, resp. rate 18, height 5\' 10"  (1.778 m), weight 72.8 kg (160 lb 8 oz), SpO2 99 %. Neurologic: Grossly normal Dressing dry  Discharge Medications:   Allergies as of 12/26/2016      Reactions   Cefazolin Hives   Lipitor [atorvastatin] Palpitations   Penicillins Rash, Other (See Comments)      Medication List    STOP taking these medications   rifampin 300 MG capsule Commonly known as:  RIFADIN     TAKE these medications   amitriptyline 25 MG tablet Commonly known as:  ELAVIL TAKE 1 TABLET BY MOUTH BEFORE BEDTIME   calcium carbonate 600 MG Tabs tablet Commonly known as:  OS-CAL Take 600 mg by mouth daily.   daptomycin IVPB Commonly known as:  CUBICIN Inject 625 mg into the vein daily.   enalapril 10 MG tablet Commonly known as:  VASOTEC Take 10 mg by mouth daily.   hydrOXYzine 10 MG tablet Commonly known as:  ATARAX/VISTARIL Take 1 tablet (10 mg total) by mouth 3 (three) times daily as needed.   oxyCODONE 5 MG immediate release tablet Commonly known as:  Oxy IR/ROXICODONE Take 1 tablet (5 mg total) by mouth every 6 (six) hours as needed for breakthrough pain.   predniSONE 5 MG tablet Commonly known as:  DELTASONE Take 1 tablet (5 mg total) by mouth daily.   predniSONE 10 MG tablet Commonly known as:  DELTASONE Take 4 tablets (40 mg total) by mouth daily with breakfast. X 2 days, then 3 tabs QD x 2 d., Then 2 tabs QD x 3 d., then 1 tab QD   tamsulosin 0.4 MG Caps capsule Commonly known as:  FLOMAX Take 1 capsule (0.4 mg total) by mouth daily after supper.            Durable Medical Equipment        Start     Ordered   12/23/16 2145  DME Walker rolling  Once    Question:  Patient needs a walker to treat with the following condition  Answer:  S/P lumbar fusion   12/23/16 2144      Disposition: Cervical osteomyelitis with kyphosis, continue home antibiotics as per infectious disease, continue treatment for rash   Final Dx:  Posterior cervical instrumented fusion  Discharge Instructions    Call MD for:  difficulty breathing, headache or visual disturbances    Complete by:  As directed    Call MD for:  persistant nausea and vomiting    Complete by:  As directed    Call MD for:  redness, tenderness, or signs of infection (pain, swelling, redness, odor or green/yellow discharge around incision site)    Complete by:  As directed    Call MD for:  severe uncontrolled pain    Complete by:  As directed    Call MD for:  temperature >100.4    Complete by:  As directed    Diet - low sodium heart healthy    Complete by:  As directed    Increase activity slowly    Complete by:  As directed    Remove dressing in 24 hours    Complete by:  As directed          Signed: Yamen Castrogiovanni S 12/26/2016, 1:04 PM

## 2016-12-26 NOTE — Progress Notes (Signed)
[facility]  Patient Status: Active (receiving services up to time of hospitalization)  [facility] is providing the following services: RN  If patient discharges after hours, please call [phone].   Edwinna Areola 12/26/2016, 10:47 AM

## 2016-12-26 NOTE — Progress Notes (Signed)
PHARMACY CONSULT NOTE FOR:  OUTPATIENT  PARENTERAL ANTIBIOTIC THERAPY (OPAT)  Indication: MSSA bacteremia - epidural abscess Regimen: daptomycin 625mg  IV q24h End date: 01/22/17  IV antibiotic discharge orders are pended. To discharging provider:  please sign these orders via discharge navigator,  Select New Orders & click on the button choice - Manage This Unsigned Work.     Thank you for allowing pharmacy to be a part of this patient's care.  Candie Mile 12/26/2016, 2:52 PM

## 2016-12-26 NOTE — Progress Notes (Signed)
Thomas Nielsen  Thomas Nielsen is an active pt with [facility] prior to this hospital readmission. [facility] will resume HHRN and [facility] services for home IV ABX (Daptomycin 625 mg IV Q 24 hours thru 01-22-17)  Pt independent with IV ABX administration prior to this readmission and will resume next dose at home tomorrow, 12-27-16 between 8-10 AM.  [facility] HHRN will see pt for [facility] tomorrow as well.  If patient discharges after hours, please call [phone].   Larry Sierras 12/26/2016, 3:22 PM

## 2016-12-26 NOTE — Care Management Note (Signed)
Case Management Note  Patient Details  Name: Thomas Nielsen MRN: [id number] Date of Birth: 23-Jul-1956  Subjective/Objective:                    Action/Plan: Pt was active with [facility] prior to admission for IV antibiotics. Pt happy with [facility] and would like to continue to use their services.  Pt with orders for walker. Pt states he has this DME at home. CM following for further d/c needs.   Expected Discharge Date:                  Expected Discharge Plan:  [place]  In-House Referral:     Discharge planning Services  CM Consult  Post Acute Care Choice:  Home Health Choice offered to:  Patient  DME Arranged:    DME Agency:     HH Arranged:  RN [place] Agency:  [place]  Status of Service:  In process, will continue to follow  If discussed at Long Length of Stay Meetings, dates discussed:    Additional Comments:  Pollie Friar, RN 12/26/2016, 11:15 AM

## 2016-12-26 NOTE — Progress Notes (Signed)
Patient ready for discharge to home; home health care in place; discharge instructions given and reviewed; Rx given; patient dressed; cervical incision to back of the neck clean dry and intact; new soft collar applied; patient discharged via wheelchair accompanied by his sister.

## 2016-12-26 NOTE — Progress Notes (Signed)
Patient ID: Thomas Nielsen, male   DOB: 1956/02/06, 61 y.o.   MRN: [id number]          [facility]  Date of Admission:  12/23/2016   Total days of antibiotics 39        Day 10 daptomycin          Active Problems:   S/P cervical spinal fusion   Osteomyelitis of cervical spine (HCC)   Eczema   Drug rash   Insomnia   Sinus tachycardia   . amitriptyline  25 mg Oral QHS  . DAPTOmycin (CUBICIN)  IV  625 mg Intravenous Q24H  . enalapril  10 mg Oral Daily  . hydrocerin   Topical BID  . hydrOXYzine  25 mg Oral TID  . predniSONE  10 mg Oral Q breakfast  . senna  1 tablet Oral BID  . sodium chloride flush  10-40 mL Intracatheter Q12H  . sodium chloride flush  3 mL Intravenous Q12H  . tamsulosin  0.4 mg Oral QPC supper    SUBJECTIVE: He is feeling a little better today and is eager to go home. He says that his rash and itching have improved.  Review of Systems: Review of Systems  Constitutional: Negative for chills, diaphoresis and fever.  Gastrointestinal: Positive for diarrhea. Negative for abdominal pain, nausea and vomiting.       One episode of loose stool this morning followed by a normal bowel movement.  Musculoskeletal: Positive for neck pain.  Skin: Positive for itching and rash.    Past Medical History:  Diagnosis Date  . Anemia, unspecified 09/06/2013  . CKD (chronic kidney disease)    on chronic low dose Prednisone  . Dermatitis   . Eczema   . HTN (hypertension)   . Hyperlipidemia   . ITP (idiopathic thrombocytopenic purpura) 2011   on chronic low dose Prednisone  . 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 Smoking status: Former Smoker    Packs/day: 1.00    Years: 30.00    Quit date: 09/06/2010  . Smokeless tobacco: Never Used  . Alcohol use No     Comment: heavy drinker in the past    Family History  Problem Relation Age of Onset  . Cancer Father     prostate ca  . Neuropathy  Mother   . Heart disease Mother   . Atrial fibrillation Mother   . Heart failure Mother    Allergies  Allergen Reactions  . Cefazolin Hives  . Lipitor [Atorvastatin] Palpitations  . Rifampin     Possible cause of rash and itching  . Penicillins Rash and Other (See Comments)    OBJECTIVE: Vitals:   12/26/16 0206 12/26/16 0516 12/26/16 0951 12/26/16 1316  BP:  100/65 112/83 (!) 117/92  Pulse:  (!) 115 (!) 125 (!) 121  Resp:  '20 18 18  '$ Temp: 99 F (37.2 C) 98.8 F (37.1 C) 98.1 F (36.7 C) 98.2 F (36.8 C)  TempSrc:  Oral Oral Oral  SpO2:  94% 99% 99%  Weight:      Height:       Body mass index is 23.03 kg/m.  Physical Exam  Constitutional: He is oriented to person, place, and time.  He is alert and in no distress.  HENT:  Soft neck brace is in place.  Neurological: He is alert and oriented to person, place, and time.  Skin: Rash noted. There is erythema.  Splotchy  erythematous rash on face and trunk and arms.  Psychiatric: Mood and affect normal.    Lab Results Lab Results  Component Value Date   WBC 12.5 (H) 12/25/2016   HGB 10.7 (L) 12/25/2016   HCT 32.5 (L) 12/25/2016   MCV 86.7 12/25/2016   PLT 236 12/25/2016    Lab Results  Component Value Date   CREATININE 1.11 12/21/2016   BUN 21 (H) 12/21/2016   NA 137 12/21/2016   K 4.2 12/21/2016   CL 101 12/21/2016   CO2 24 12/21/2016    Lab Results  Component Value Date   ALT 56 11/22/2016   AST 23 11/22/2016   ALKPHOS 38 11/22/2016   BILITOT 0.8 11/22/2016     Microbiology: Recent Results (from the past 240 hour(s))  Surgical pcr screen     Status: None   Collection Time: 12/21/16 10:50 AM  Result Value Ref Range Status   MRSA, PCR NEGATIVE NEGATIVE Final   Staphylococcus aureus NEGATIVE NEGATIVE Final    Comment:        The Xpert SA Assay (FDA approved for NASAL specimens in patients over 7 years of age), is one component of a comprehensive surveillance program.  Test performance  has been validated by [facility] for patients greater than or equal to 48 year old. It is not intended to diagnose infection nor to guide or monitor treatment.      ASSESSMENT: He had MSSA bacteremia and C-spine infection with positive cultures on 11/17/2016. C-spine cultures were again positive on 11/26/2016. He had failure of anterior fusion and underwent posterior fusion 3 days ago. He is feeling better with the exception of persistent, pruritic red rash. It is unclear what is causing the rash. All previous antibiotics have been stopped including cefazolin, ceftriaxone, vancomycin and rifampin. He will be discharged today on continued IV daptomycin. I would plan on 6 weeks starting from the last positive culture on 11/26/2016.  PLAN: 1. Continue daptomycin 2. I will arrange follow-up in our clinic  Diagnosis: Cervical spine osteomyelitis  Culture Result: MRSA  Allergies  Allergen Reactions  . Cefazolin Hives  . Lipitor [Atorvastatin] Palpitations  . Rifampin     Possible cause of rash and itching  . Penicillins Rash and Other (See Comments)    Discharge antibiotics: Per pharmacy protocol Daptomycin  Duration: 6+ weeks End Date: 01/07/2017  [facility] Care Per Protocol:  Labs weekly while on IV antibiotics: _x_ CBC with differential _x_ BMP _x_ CK _x_ CRP _x_ ESR __ Vancomycin trough  __ Please pull PIC at completion of IV antibiotics _x_ Please leave PIC in place until doctor has seen patient or been notified  Fax weekly labs to [phone]  Clinic Follow Up Appt: I will arrange follow-up in our clinic with Dr. Tye Maryland, [place] for [place] [phone] pager   [phone] cell 12/26/2016, 2:52 PM

## 2016-12-27 ENCOUNTER — Telehealth: Payer: Self-pay | Admitting: *Deleted

## 2016-12-27 ENCOUNTER — Encounter (HOSPITAL_COMMUNITY): Payer: Self-pay | Admitting: Neurological Surgery

## 2016-12-27 NOTE — Telephone Encounter (Signed)
Patient sister Baker Janus called to check and see what dose of Prednisone the patient should be taking. He has a 10 mg dose pack as well as 5 mg daily Rx. She just needs some clarification. Advised her not sure as he was given the medication at the hospital. Will send note to the doctor and give her a call back.

## 2016-12-29 ENCOUNTER — Inpatient Hospital Stay: Payer: BLUE CROSS/BLUE SHIELD | Admitting: Internal Medicine

## 2016-12-29 NOTE — Telephone Encounter (Signed)
I called patient and told him that he should be back to his prednisone 5mg  daily

## 2017-01-05 ENCOUNTER — Encounter: Payer: Self-pay | Admitting: Internal Medicine

## 2017-01-05 ENCOUNTER — Ambulatory Visit (INDEPENDENT_AMBULATORY_CARE_PROVIDER_SITE_OTHER): Payer: BLUE CROSS/BLUE SHIELD | Admitting: Internal Medicine

## 2017-01-05 VITALS — BP 122/86 | HR 108 | Temp 97.6°F | Ht 70.0 in | Wt 155.0 lb

## 2017-01-05 DIAGNOSIS — R000 Tachycardia, unspecified: Secondary | ICD-10-CM

## 2017-01-05 DIAGNOSIS — M4642 Discitis, unspecified, cervical region: Secondary | ICD-10-CM

## 2017-01-05 DIAGNOSIS — A4901 Methicillin susceptible Staphylococcus aureus infection, unspecified site: Secondary | ICD-10-CM

## 2017-01-05 DIAGNOSIS — L270 Generalized skin eruption due to drugs and medicaments taken internally: Secondary | ICD-10-CM | POA: Diagnosis not present

## 2017-01-05 DIAGNOSIS — Z5181 Encounter for therapeutic drug level monitoring: Secondary | ICD-10-CM

## 2017-01-05 DIAGNOSIS — T3795XA Adverse effect of unspecified systemic anti-infective and antiparasitic, initial encounter: Secondary | ICD-10-CM

## 2017-01-05 NOTE — Progress Notes (Signed)
RFV: follow up on MSSA cervical infection  Patient ID: Thomas Nielsen, male   DOB: 10-13-1955, 60 y.o.   MRN: [id number]  HPI  Thomas Nielsen is a 61 y.o. male with  MSSA bacteremia c/b epidural abscess and C5-C7 vertebral osteo s/p C6-7 ACDF for epidural abscess. He was initially on cefazolin plus rifampin but developed a significant maculopapular rash from the cephalosporin for which we had to treat him with prednisone taper and switched to daptomycin. In follow up with Dr Yetta Barre he was found to have significant kyphosis from progressive subsidence of the C6 vertebral body secondary to softening of the bone from the osteomyelitis.He recommended a posterior cervical fixation C4-T2, for which he underwent on evening of 3/23. He tolerated his surgery without difficulty. He had maintained being on daptomycin with out any new rash. Occasionally has elbow intermittent achiness. He denies myalgias  Since being discharged, he is no longer having sinus tachycardia as noted in the hospital. He has been more aggressive with applying lotion to his eczema which has improved.    Outpatient Encounter Prescriptions as of 01/05/2017  Medication Sig  . amitriptyline (ELAVIL) 25 MG tablet TAKE 1 TABLET BY MOUTH BEFORE BEDTIME  . calcium carbonate (OS-CAL) 600 MG TABS Take 600 mg by mouth daily.  . daptomycin (CUBICIN) IVPB Inject 625 mg into the vein daily. Indication:  MSSA bacteremia - epidural abscess Last Day of Therapy:  01/22/17 Labs - Once weekly:  CBC/D, BMP, and CPK Labs - Every other week:  ESR and CRP  . enalapril (VASOTEC) 10 MG tablet Take 10 mg by mouth daily.   . hydrOXYzine (ATARAX/VISTARIL) 10 MG tablet Take 1 tablet (10 mg total) by mouth 3 (three) times daily as needed.  Marland Kitchen oxyCODONE (OXY IR/ROXICODONE) 5 MG immediate release tablet Take 1 tablet (5 mg total) by mouth every 6 (six) hours as needed for breakthrough pain.  . predniSONE (DELTASONE) 5 MG tablet Take 1 tablet (5 mg total) by mouth  daily.  . predniSONE (DELTASONE) 10 MG tablet Take 4 tablets (40 mg total) by mouth daily with breakfast. X 2 days, then 3 tabs QD x 2 d., Then 2 tabs QD x 3 d., then 1 tab QD (Patient not taking: Reported on 01/05/2017)  . tamsulosin (FLOMAX) 0.4 MG CAPS capsule Take 1 capsule (0.4 mg total) by mouth daily after supper. (Patient not taking: Reported on 01/05/2017)  . [DISCONTINUED] daptomycin (CUBICIN) IVPB Inject 625 mg into the vein daily.   No facility-administered encounter medications on file as of 01/05/2017.      Patient Active Problem List   Diagnosis Date Noted  . Osteomyelitis of cervical spine (HCC)   . Eczema   . Drug rash   . Insomnia   . Sinus tachycardia   . S/P cervical spinal fusion 12/23/2016  . 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  . Surgery, elective   . Staph aureus infection   . Nephrotic syndrome   . 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  . Anemia, unspecified 09/06/2013  . 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 HTN (hypertension)   . Hyperlipidemia   . ITP (idiopathic thrombocytopenic purpura)   . Stage 3 chronic kidney disease      Health Maintenance Due  Topic Date Due  . TETANUS/TDAP  12/24/1972  . COLONOSCOPY  12/25/2003     Review of Systems 12 point ros is negative Physical Exam   BP 122/86   Pulse (!) 108  Temp 97.6 F (36.4 C) (Oral)   Ht '5\' 10"'$  (1.778 m)   Wt 155 lb (70.3 kg)   BMI 22.24 kg/m   Physical Exam  Constitutional: He is oriented to person, place, and time. He appears well-developed and well-nourished. No distress.  HENT: wearing soft collar Mouth/Throat: Oropharynx is clear and moist. No oropharyngeal exudate.  Cardiovascular: Normal rate, regular rhythm and normal heart sounds. Exam reveals no gallop and no friction rub.  No murmur heard.  Pulmonary/Chest: Effort normal and breath sounds normal. No respiratory distress. He has no wheezes.  Abdominal: Soft. Bowel sounds are  normal. He exhibits no distension. There is no tenderness.  Lymphadenopathy:  He has no cervical adenopathy.  Neurological: He is alert and oriented to person, place, and time.  Skin: Surgical incision to back of neck is well healed no erythema Psychiatric: He has a normal mood and affect. His behavior is normal.    Lab Results  Component Value Date   HEPBSAB NEG 12/01/2010   No results found for: RPR, LABRPR  CBC Lab Results  Component Value Date   WBC 12.5 (H) 12/25/2016   RBC 3.75 (L) 12/25/2016   HGB 10.7 (L) 12/25/2016   HCT 32.5 (L) 12/25/2016   PLT 236 12/25/2016   MCV 86.7 12/25/2016   MCH 28.5 12/25/2016   MCHC 32.9 12/25/2016   RDW 13.7 12/25/2016   LYMPHSABS 0.8 12/25/2016   MONOABS 0.6 12/25/2016   EOSABS 0.0 12/25/2016    BMET Lab Results  Component Value Date   NA 137 12/21/2016   K 4.2 12/21/2016   CL 101 12/21/2016   CO2 24 12/21/2016   GLUCOSE 115 (H) 12/21/2016   BUN 21 (H) 12/21/2016   CREATININE 1.11 12/21/2016   CALCIUM 10.0 12/21/2016   GFRNONAA >60 12/21/2016   GFRAA >60 12/21/2016    Lab Results  Component Value Date   ESRSEDRATE 70 (H) 12/24/2016   Lab Results  Component Value Date   CRP 3.0 (H) 12/24/2016     Assessment and Plan  MSSA cervical osteo, hx of bacteremia, and epidural abscess=  Doing better. Incisions are well healed. will check sed rate and crp  If trending down, still plan for last day of dapton on 4/22. If not trending down appropriately then will need to extend  Drug monitoring = I have reivewed labs from home health. Show that CK is just above ULN. Will check this coming Monday to decide if need to change to other agent.  Sinus tach =  Improved, pcp has referred him to cardiology  Hx of nephritis = continue with pred '5mg'$   Drug rash to cefazolin = appears improved after steroid taper

## 2017-01-06 LAB — SEDIMENTATION RATE

## 2017-01-06 LAB — C-REACTIVE PROTEIN: CRP: 46.9 mg/L — ABNORMAL HIGH (ref <8.0)

## 2017-01-09 ENCOUNTER — Telehealth: Payer: Self-pay | Admitting: *Deleted

## 2017-01-09 NOTE — Telephone Encounter (Signed)
[facility] RN Alysha calling at patient's request.  He states that Dr Baxter Flattery mentioned the possibility of using a Biopatch at his next PICC dressing change.  [facility] RN calling to follow up on this.  She would need an order to use a biopatch.  This order can be called to her and to [place]. Please advise. Landis Gandy, RN

## 2017-01-12 NOTE — Telephone Encounter (Signed)
We can do a biopatch. Please give verbal order to [facility]

## 2017-01-12 NOTE — Telephone Encounter (Signed)
Order given to Coretta at [facility].

## 2017-01-16 ENCOUNTER — Telehealth: Payer: Self-pay | Admitting: *Deleted

## 2017-01-16 NOTE — Telephone Encounter (Signed)
Per [facility] RN, stop date for iv antibiotics is 4/22.  She is asking for confirmation of this stop date, orders to pull PICC at end of treatment, and will discharge patient from home health 4/23. Labs drawn 4/16.  Please advise based on today's labs if patient will need additional IV antibiotic therapy, stop date/pull picc orders, and if he will need oral antibiotic therapy. Landis Gandy, RN

## 2017-01-16 NOTE — Telephone Encounter (Signed)
I cant see today's labs. Last CRP was up and ESR was cancelled that was in Epic. If it based on those labs we should extend for another month and have him followup with Caren Griffins

## 2017-01-18 ENCOUNTER — Telehealth: Payer: Self-pay | Admitting: Pharmacist

## 2017-01-18 NOTE — Telephone Encounter (Signed)
Thanks Cassie 

## 2017-01-18 NOTE — Telephone Encounter (Signed)
Thomas Gong, RN at [facility], and extended Thomas Nielsen's Daptomycin until at least 5/3 when he sees Dr. Baxter Flattery for follow-up.  Most recent ESR was 72. RN verbalized understanding - orders per Dr. Tommy Medal.

## 2017-02-02 ENCOUNTER — Encounter: Payer: Self-pay | Admitting: Internal Medicine

## 2017-02-02 ENCOUNTER — Ambulatory Visit (INDEPENDENT_AMBULATORY_CARE_PROVIDER_SITE_OTHER): Payer: BLUE CROSS/BLUE SHIELD | Admitting: Internal Medicine

## 2017-02-02 VITALS — BP 119/80 | HR 114 | Temp 97.7°F | Ht 70.0 in | Wt 165.0 lb

## 2017-02-02 DIAGNOSIS — A4901 Methicillin susceptible Staphylococcus aureus infection, unspecified site: Secondary | ICD-10-CM

## 2017-02-02 DIAGNOSIS — M4622 Osteomyelitis of vertebra, cervical region: Secondary | ICD-10-CM

## 2017-02-02 DIAGNOSIS — M4722 Other spondylosis with radiculopathy, cervical region: Secondary | ICD-10-CM | POA: Diagnosis not present

## 2017-02-02 LAB — CBC WITH DIFFERENTIAL/PLATELET
BASOS PCT: 1 %
Basophils Absolute: 78 cells/uL (ref 0–200)
EOS ABS: 234 {cells}/uL (ref 15–500)
Eosinophils Relative: 3 %
HCT: 30.3 % — ABNORMAL LOW (ref 38.5–50.0)
Hemoglobin: 10.1 g/dL — ABNORMAL LOW (ref 13.2–17.1)
Lymphocytes Relative: 8 %
Lymphs Abs: 624 cells/uL — ABNORMAL LOW (ref 850–3900)
MCH: 28.1 pg (ref 27.0–33.0)
MCHC: 33.3 g/dL (ref 32.0–36.0)
MCV: 84.4 fL (ref 80.0–100.0)
MONOS PCT: 7 %
MPV: 9.4 fL (ref 7.5–12.5)
Monocytes Absolute: 546 cells/uL (ref 200–950)
NEUTROS ABS: 6318 {cells}/uL (ref 1500–7800)
Neutrophils Relative %: 81 %
PLATELETS: 238 10*3/uL (ref 140–400)
RBC: 3.59 MIL/uL — AB (ref 4.20–5.80)
RDW: 15.1 % — ABNORMAL HIGH (ref 11.0–15.0)
WBC: 7.8 10*3/uL (ref 3.8–10.8)

## 2017-02-03 ENCOUNTER — Other Ambulatory Visit: Payer: Self-pay | Admitting: Pharmacist

## 2017-02-03 ENCOUNTER — Telehealth: Payer: Self-pay | Admitting: Pharmacist

## 2017-02-03 DIAGNOSIS — G062 Extradural and subdural abscess, unspecified: Secondary | ICD-10-CM

## 2017-02-03 LAB — PROTIME-INR
INR: 1
PROTHROMBIN TIME: 10.5 s (ref 9.0–11.5)

## 2017-02-03 LAB — SEDIMENTATION RATE: SED RATE: 84 mm/h — AB (ref 0–20)

## 2017-02-03 LAB — C-REACTIVE PROTEIN: CRP: 51.9 mg/L — ABNORMAL HIGH (ref <8.0)

## 2017-02-03 MED ORDER — DOXYCYCLINE HYCLATE 100 MG PO TABS: 100.0000 mg | ORAL_TABLET | Freq: Two times a day (BID) | ORAL | 1 refills | Status: DC

## 2017-02-03 NOTE — Telephone Encounter (Signed)
Per Dr. Baxter Flattery, called Thomas Nielsen to tell him that his IV dapto will be stopped today and [facility] will pull his PICC.  Advised him that I sent in doxy 100 mg PO BID x 2 months to [facility] on [address]. Advised him to take with food and to call if he develops nausea and needs medication. He verbalized understanding.  Called and spoke to Coretta at [facility] and gave verbal order to pull his PICC.

## 2017-02-03 NOTE — Progress Notes (Signed)
RFV: follow up for MSSA cervical osteo Patient ID: Thomas Nielsen, male   DOB: [birth year], 61 y.o.   MRN: [id number]  HPI Thomas Granville Bowmanis a 61 y.o.malewith MSSA bacteremia c/b epidural abscess and C5-C7 vertebral osteo s/pC6-7 ACDF for epidural abscess. He was initially on cefazolin plus rifampin but developed a significant maculopapular rash from the cephalosporin for which we had to treat him with prednisone taper and switched to daptomycin. In follow up with Dr Ronnald Ramp he was found to have significant kyphosis from progressive subsidence of the C6 vertebral body secondary to softening of the bone from the osteomyelitis.He recommended a posterior cervical fixation C4-T2, for which he underwent on evening of 3/23. He tolerated his surgery without difficulty. He had maintained being on daptomycin with out any new rash or muscle aches.  He has been using lotion for his eczema which is much improved. He is feeling better  His sed rate in mid April [facility] 72 [facility]. He states he feels better. He is looking forward to doing more activity.  Outpatient Encounter Prescriptions as of 02/02/2017  Medication Sig  . amitriptyline (ELAVIL) 25 MG tablet TAKE 1 TABLET BY MOUTH BEFORE BEDTIME  . Ascorbic Acid (VITAMIN C) 1000 MG tablet Take 1,000 mg by mouth daily.  . calcium carbonate (OS-CAL) 600 MG TABS Take 600 mg by mouth daily.  . daptomycin (CUBICIN) IVPB Inject 625 mg into the vein daily. Indication:  MSSA bacteremia - epidural abscess Last Day of Therapy:  01/22/17 Labs - Once weekly:  CBC/D, BMP, and CPK Labs - Every other week:  ESR and CRP  . enalapril (VASOTEC) 10 MG tablet Take 10 mg by mouth daily.   . hydrOXYzine (ATARAX/VISTARIL) 10 MG tablet Take 1 tablet (10 mg total) by mouth 3 (three) times daily as needed.  Marland Kitchen oxyCODONE (OXY IR/ROXICODONE) 5 MG immediate release tablet Take 1 tablet (5 mg total) by mouth every 6 (six) hours as needed for breakthrough pain.  .  predniSONE (DELTASONE) 5 MG tablet Take 1 tablet (5 mg total) by mouth daily.  . predniSONE (DELTASONE) 10 MG tablet Take 4 tablets (40 mg total) by mouth daily with breakfast. X 2 days, then 3 tabs QD x 2 d., Then 2 tabs QD x 3 d., then 1 tab QD (Patient not taking: Reported on 02/02/2017)  . tamsulosin (FLOMAX) 0.4 MG CAPS capsule Take 1 capsule (0.4 mg total) by mouth daily after supper. (Patient not taking: Reported on 01/05/2017)   No facility-administered encounter medications on file as of 02/02/2017.      Patient Active Problem List   Diagnosis Date Noted  . Osteomyelitis of cervical spine ([place])   . Eczema   . Drug rash   . Insomnia   . Sinus tachycardia   . S/P cervical spinal fusion 12/23/2016  . 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  . Surgery, elective   . Staph aureus infection   . Nephrotic syndrome   . 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  . Anemia, unspecified 09/06/2013  . 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 HTN (hypertension)   . Hyperlipidemia   . ITP (idiopathic thrombocytopenic purpura)   . Stage 3 chronic kidney disease      Health Maintenance Due  Topic Date Due  . TETANUS/TDAP  12/24/1972  . COLONOSCOPY  12/25/2003     Review of Systems 12 point ros is negative.   Physical Exam   BP 119/80  Pulse (!) 114   Temp 97.7 F (36.5 C) (Oral)   Ht '5\' 10"'$  (1.778 m)   Wt 165 lb (74.8 kg)   BMI 23.68 kg/m   Physical Exam  Constitutional: He is oriented to person, place, and time. He appears well-developed and well-nourished. No distress.  HENT:  Mouth/Throat: Oropharynx is clear and moist. No oropharyngeal exudate.  Neck: anterior incision is well healed no erythema or fluctuance. Posterior incision is well healed, no erythema or fluctuance or dehiscence Cardiovascular: Normal rate, regular rhythm and normal heart sounds. Exam reveals no gallop and no friction rub.  No murmur heard.  Pulmonary/Chest:  Effort normal and breath sounds normal. No respiratory distress. He has no wheezes.  Lymphadenopathy:  He has no cervical adenopathy.  Neurological: He is alert and oriented to person, place, and time.  Skin: Skin is warm and dry. No rash noted. No erythema.  Psychiatric: He has a normal mood and affect. His behavior is normal.    Lab Results  Component Value Date   HEPBSAB NEG 12/01/2010   No results found for: RPR, LABRPR  CBC Lab Results  Component Value Date   WBC 7.8 02/02/2017   RBC 3.59 (L) 02/02/2017   HGB 10.1 (L) 02/02/2017   HCT 30.3 (L) 02/02/2017   PLT 238 02/02/2017   MCV 84.4 02/02/2017   MCH 28.1 02/02/2017   MCHC 33.3 02/02/2017   RDW 15.1 (H) 02/02/2017   LYMPHSABS 624 (L) 02/02/2017   MONOABS 546 02/02/2017   EOSABS 234 02/02/2017    BMET Lab Results  Component Value Date   NA 137 12/21/2016   K 4.2 12/21/2016   CL 101 12/21/2016   CO2 24 12/21/2016   GLUCOSE 115 (H) 12/21/2016   BUN 21 (H) 12/21/2016   CREATININE 1.11 12/21/2016   CALCIUM 10.0 12/21/2016   GFRNONAA >60 12/21/2016   GFRAA >60 12/21/2016      Assessment and Plan  MSSA cervical osteo, hx of bacteremia plus epidural abscess= he has finished 6 wk since his posterior cervical fixation. Will check sed rate and crp to decide to pull out picc. He has one more dose tomorrow am.  Addendum- Sed rate still elevated but clinically looks improved. I wonder if inflammatory markers are elevated due to other non-infectious causes. We will switch to chronic suppression with doxycycline '100mg'$  BID.  Spent 15 min in face to face time with patient counseling on management of MSSA infection  Copy to Dr Ronnald Ramp

## 2017-03-06 NOTE — Addendum Note (Signed)
Addendum  created 03/06/17 1137 by Oleta Mouse, MD   Sign clinical note

## 2017-03-07 ENCOUNTER — Ambulatory Visit (INDEPENDENT_AMBULATORY_CARE_PROVIDER_SITE_OTHER): Payer: BLUE CROSS/BLUE SHIELD | Admitting: Internal Medicine

## 2017-03-07 ENCOUNTER — Encounter: Payer: Self-pay | Admitting: Internal Medicine

## 2017-03-07 VITALS — BP 89/61 | HR 112 | Temp 98.5°F | Ht 70.5 in | Wt 165.4 lb

## 2017-03-07 DIAGNOSIS — L559 Sunburn, unspecified: Secondary | ICD-10-CM | POA: Diagnosis not present

## 2017-03-07 DIAGNOSIS — G062 Extradural and subdural abscess, unspecified: Secondary | ICD-10-CM | POA: Diagnosis not present

## 2017-03-07 DIAGNOSIS — M4643 Discitis, unspecified, cervicothoracic region: Secondary | ICD-10-CM | POA: Diagnosis not present

## 2017-03-07 DIAGNOSIS — L409 Psoriasis, unspecified: Secondary | ICD-10-CM

## 2017-03-07 MED ORDER — DOXYCYCLINE HYCLATE 100 MG PO TABS
100.0000 mg | ORAL_TABLET | Freq: Two times a day (BID) | ORAL | 3 refills | Status: DC
Start: 2017-03-07 — End: 2017-05-05

## 2017-03-08 LAB — SEDIMENTATION RATE: Sed Rate: 30 mm/hr — ABNORMAL HIGH (ref 0–20)

## 2017-03-08 LAB — C-REACTIVE PROTEIN: CRP: 0.9 mg/L (ref <8.0)

## 2017-03-09 NOTE — Progress Notes (Signed)
RFV: follow up for MSSA cervical discitis/epidural abscess  Patient ID: Thomas Nielsen, male   DOB: [birth year], 61 y.o.   MRN: [id number]  HPI Thomas Nielsen is a 61 yo M with complicated history of MSSA bacteremia c/b epidural abscess and C5-C7 vertebral osteo s/pC6-7 ACDF for epidural abscess. He was initially on cefazolin plus rifampin but developed a significant maculopapular rash from the cephalosporin for which we had to treat him with prednisone taper and switched to daptomycin. In follow up with Dr Ronnald Ramp he was found to have significant kyphosis from progressive subsidence of the C6 vertebral body secondary to softening of the bone from the osteomyelitis.He recommended a posterior cervical fixation C4-T2, for which he underwent on evening of 3/23. He tolerated his surgery without difficulty. He had maintained on daptomycin until 5/4 and switched to doxycycline. He denies GI side effects of doxycycline but he did sustain significant sunburn while mowing lawn and also placing his arm out the window for driving  He is feeling better, some muscle tightness of his neck towards the end of the day but denies back pain or significant neck pain. No diarrhea  He is back to work, mostly desk job.  I have reviewed his records in epic Outpatient Encounter Prescriptions as of 03/07/2017  Medication Sig  . Ascorbic Acid (VITAMIN C) 1000 MG tablet Take 1,000 mg by mouth daily.  . calcium carbonate (OS-CAL) 600 MG TABS Take 600 mg by mouth daily.  Marland Kitchen doxycycline (VIBRA-TABS) 100 MG tablet Take 1 tablet (100 mg total) by mouth 2 (two) times daily.  . enalapril (VASOTEC) 10 MG tablet Take 10 mg by mouth daily.   . predniSONE (DELTASONE) 10 MG tablet Take 4 tablets (40 mg total) by mouth daily with breakfast. X 2 days, then 3 tabs QD x 2 d., Then 2 tabs QD x 3 d., then 1 tab QD  . predniSONE (DELTASONE) 5 MG tablet Take 1 tablet (5 mg total) by mouth daily.  . [DISCONTINUED] doxycycline (VIBRA-TABS) 100 MG tablet  Take 1 tablet (100 mg total) by mouth 2 (two) times daily.  Marland Kitchen amitriptyline (ELAVIL) 25 MG tablet TAKE 1 TABLET BY MOUTH BEFORE BEDTIME  . hydrOXYzine (ATARAX/VISTARIL) 10 MG tablet Take 1 tablet (10 mg total) by mouth 3 (three) times daily as needed. (Patient not taking: Reported on 03/07/2017)  . oxyCODONE (OXY IR/ROXICODONE) 5 MG immediate release tablet Take 1 tablet (5 mg total) by mouth every 6 (six) hours as needed for breakthrough pain. (Patient not taking: Reported on 03/07/2017)  . tamsulosin (FLOMAX) 0.4 MG CAPS capsule Take 1 capsule (0.4 mg total) by mouth daily after supper. (Patient not taking: Reported on 01/05/2017)   No facility-administered encounter medications on file as of 03/07/2017.      Patient Active Problem List   Diagnosis Date Noted  . 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 Eczema   . Drug rash   . Insomnia   . Sinus tachycardia   . S/P cervical spinal fusion 12/23/2016  . 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  . Surgery, elective   . Staph aureus infection   . Nephrotic syndrome   . 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  . Anemia, unspecified 09/06/2013  . 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 HTN (hypertension)   . Hyperlipidemia   . ITP (idiopathic thrombocytopenic purpura)   . Stage 3 chronic kidney disease    Social History  Substance Use Topics  . Smoking  status: Former Smoker    Packs/day: 1.00    Years: 30.00    Quit date: 09/06/2010  . Smokeless tobacco: Never Used  . Alcohol use No     Comment: heavy drinker in the past    Health Maintenance Due  Topic Date Due  . TETANUS/TDAP  12/24/1972  . COLONOSCOPY  12/25/2003     Review of Systems  Physical Exam   BP (!) 89/61   Pulse (!) 112   Temp 98.5 F (36.9 C) (Oral)   Ht 5' 10.5" (1.791 m)   Wt 165 lb 6.4 oz (75 kg)   BMI 23.40 kg/m   Physical Exam  Constitutional: He is oriented to person, place, and time. He appears well-developed  and well-nourished. No distress.  HENT:  Mouth/Throat: Oropharynx is clear and moist. No oropharyngeal exudate.  Neck = incisions are c/d/i Cardiovascular: Normal rate, regular rhythm and normal heart sounds. Exam reveals no gallop and no friction rub.  No murmur heard.  Pulmonary/Chest: Effort normal and breath sounds normal. No respiratory distress. He has no wheezes.  Abdominal: Soft. Bowel sounds are normal. He exhibits no distension. There is no tenderness.  Lymphadenopathy:  He has no cervical adenopathy.  Neurological: He is alert and oriented to person, place, and time.  Skin: extremely dry hands and erythamatous from exzema/psoriasis Psychiatric: He has a normal mood and affect. His behavior is normal.    Lab Results  Component Value Date   HEPBSAB NEG 12/01/2010   No results found for: RPR, LABRPR  CBC Lab Results  Component Value Date   WBC 7.8 02/02/2017   RBC 3.59 (L) 02/02/2017   HGB 10.1 (L) 02/02/2017   HCT 30.3 (L) 02/02/2017   PLT 238 02/02/2017   MCV 84.4 02/02/2017   MCH 28.1 02/02/2017   MCHC 33.3 02/02/2017   RDW 15.1 (H) 02/02/2017   LYMPHSABS 624 (L) 02/02/2017   MONOABS 546 02/02/2017   EOSABS 234 02/02/2017    BMET Lab Results  Component Value Date   NA 137 12/21/2016   K 4.2 12/21/2016   CL 101 12/21/2016   CO2 24 12/21/2016   GLUCOSE 115 (H) 12/21/2016   BUN 21 (H) 12/21/2016   CREATININE 1.11 12/21/2016   CALCIUM 10.0 12/21/2016   GFRNONAA >60 12/21/2016   GFRAA >60 12/21/2016   Lab Results  Component Value Date   ESRSEDRATE 30 (H) 03/07/2017   Lab Results  Component Value Date   CRP 0.9 03/07/2017      Assessment and Plan  mssa discitis/epidural abscess = continue with doxycycline 100mg  BID. Plan to treat for 3 additional months. His inflammatory markers are nearly normalized. Will check sed rate and crp  SE of meds = doxy can make one extremely sensitive to the sun. Recommended SPF 50 plus long sleeves this summer.  Large brim hat for mowing  Psoriasis/eczema = recommend to use heavy application of eucerin plus vaseline to hands to minimize cracking of skin

## 2017-05-05 ENCOUNTER — Other Ambulatory Visit: Payer: Self-pay | Admitting: Internal Medicine

## 2017-05-05 DIAGNOSIS — G062 Extradural and subdural abscess, unspecified: Secondary | ICD-10-CM

## 2017-05-30 ENCOUNTER — Other Ambulatory Visit: Payer: Self-pay | Admitting: Internal Medicine

## 2017-05-30 DIAGNOSIS — G062 Extradural and subdural abscess, unspecified: Secondary | ICD-10-CM

## 2017-06-07 ENCOUNTER — Encounter: Payer: Self-pay | Admitting: Internal Medicine

## 2017-06-07 ENCOUNTER — Ambulatory Visit (INDEPENDENT_AMBULATORY_CARE_PROVIDER_SITE_OTHER): Payer: BLUE CROSS/BLUE SHIELD | Admitting: Internal Medicine

## 2017-06-07 VITALS — BP 117/75 | HR 93 | Temp 98.2°F | Wt 164.0 lb

## 2017-06-07 DIAGNOSIS — M4642 Discitis, unspecified, cervical region: Secondary | ICD-10-CM

## 2017-06-07 DIAGNOSIS — Z23 Encounter for immunization: Secondary | ICD-10-CM

## 2017-06-07 NOTE — Progress Notes (Signed)
RFV: complicated cervical discitis with MSSA  Patient ID: Thomas Nielsen, male   DOB: Feb 04, 1956, 61 y.o.   MRN: [id number]  HPI Dawayne is a 61 yo M with history of MSSA discitis. In march 2018, he was found to have MSSA bacteremia c/b epidural abscess and C5-C7 vertebral osteo s/pC6-7 ACDF for epidural abscess. He was initially on cefazolin plus rifampin but developed a significant maculopapular rash from the cephalosporin for which we had to treat him with prednisone taper and switched to daptomycin. In follow up with Dr Ronnald Ramp he was found to have significant kyphosis from progressive subsidence of the C6 vertebral body secondary to softening of the bone from the osteomyelitis.He recommended a posterior cervical fixation C4-T2, for which he underwent on evening of 3/23. He tolerated his surgery without difficulty. He had maintained being on daptomycin with out any new rash or muscle aches.he has been on doxycycline for chronic suppression. He was last seen in early June since then he feels that he continues to improve.   He does notice having neck pain towards the end of the day or if he holds his head in certain position. He notices that he has had no improvement with increased mobility to his right shoulder. Still remains limited. Unable to raise above head. He sees dr Ronnald Ramp next week.  Outpatient Encounter Prescriptions as of 06/07/2017  Medication Sig  . amitriptyline (ELAVIL) 25 MG tablet TAKE 1 TABLET BY MOUTH BEFORE BEDTIME  . Ascorbic Acid (VITAMIN C) 1000 MG tablet Take 1,000 mg by mouth daily.  . calcium carbonate (OS-CAL) 600 MG TABS Take 600 mg by mouth daily.  Marland Kitchen doxycycline (VIBRA-TABS) 100 MG tablet TAKE 1 TABLET BY MOUTH TWICE A DAY  . enalapril (VASOTEC) 10 MG tablet Take 10 mg by mouth daily.   . hydrOXYzine (ATARAX/VISTARIL) 10 MG tablet Take 1 tablet (10 mg total) by mouth 3 (three) times daily as needed. (Patient not taking: Reported on 03/07/2017)  . oxyCODONE (OXY  IR/ROXICODONE) 5 MG immediate release tablet Take 1 tablet (5 mg total) by mouth every 6 (six) hours as needed for breakthrough pain. (Patient not taking: Reported on 03/07/2017)  . predniSONE (DELTASONE) 10 MG tablet Take 4 tablets (40 mg total) by mouth daily with breakfast. X 2 days, then 3 tabs QD x 2 d., Then 2 tabs QD x 3 d., then 1 tab QD  . predniSONE (DELTASONE) 5 MG tablet Take 1 tablet (5 mg total) by mouth daily.  . tamsulosin (FLOMAX) 0.4 MG CAPS capsule Take 1 capsule (0.4 mg total) by mouth daily after supper. (Patient not taking: Reported on 01/05/2017)   No facility-administered encounter medications on file as of 06/07/2017.      Patient Active Problem List   Diagnosis Date Noted  . 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 Eczema   . Drug rash   . Insomnia   . Sinus tachycardia   . S/P cervical spinal fusion 12/23/2016  . 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  . Surgery, elective   . Staph aureus infection   . Nephrotic syndrome   . 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  . Anemia, unspecified 09/06/2013  . 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 HTN (hypertension)   . Hyperlipidemia   . ITP (idiopathic thrombocytopenic purpura)   . Stage 3 chronic kidney disease      Health Maintenance Due  Topic Date Due  . TETANUS/TDAP  12/24/1972  . COLONOSCOPY  12/25/2003  . INFLUENZA VACCINE  05/03/2017     Review of Systems  Physical Exam   BP 117/75   Pulse 93   Temp 98.2 F (36.8 C) (Oral)   Wt 164 lb (74.4 kg)   BMI 23.20 kg/m  Physical Exam  Constitutional: He is oriented to person, place, and time. He appears well-developed and well-nourished. No distress.  HENT:  Mouth/Throat: Oropharynx is clear and moist. No oropharyngeal exudate.  Cardiovascular: Normal rate, regular rhythm and normal heart sounds. Exam reveals no gallop and no friction rub.  No murmur heard.  Pulmonary/Chest: Effort normal and breath sounds  normal. No respiratory distress. He has no wheezes.  Abdominal: Soft. Bowel sounds are normal. He exhibits no distension. There is no tenderness.  Lymphadenopathy:  He has no cervical adenopathy.  Neurological: He is alert and oriented to person, place, and time.  Skin: Skin is warm and dry. No rash noted. No erythema.  Psychiatric: He has a normal mood and affect. His behavior is normal.    Lab Results  Component Value Date   HEPBSAB NEG 12/01/2010   No results found for: RPR, LABRPR  CBC Lab Results  Component Value Date   WBC 7.8 02/02/2017   RBC 3.59 (L) 02/02/2017   HGB 10.1 (L) 02/02/2017   HCT 30.3 (L) 02/02/2017   PLT 238 02/02/2017   MCV 84.4 02/02/2017   MCH 28.1 02/02/2017   MCHC 33.3 02/02/2017   RDW 15.1 (H) 02/02/2017   LYMPHSABS 624 (L) 02/02/2017   MONOABS 546 02/02/2017   EOSABS 234 02/02/2017    BMET Lab Results  Component Value Date   NA 137 12/21/2016   K 4.2 12/21/2016   CL 101 12/21/2016   CO2 24 12/21/2016   GLUCOSE 115 (H) 12/21/2016   BUN 21 (H) 12/21/2016   CREATININE 1.11 12/21/2016   CALCIUM 10.0 12/21/2016   GFRNONAA >60 12/21/2016   GFRAA >60 12/21/2016    Lab Results  Component Value Date   ESRSEDRATE 41 (H) 06/07/2017   Lab Results  Component Value Date   CRP 3.1 06/07/2017     Assessment and Plan  mssa discitis = will check sed rate and crp to assess if still need to continue with doxycycline. Based on lab results, we will continue doxycycline 100mg  BID for addn 2 months.  Neck pain = likely sequelae from infection and surgery. It does not appear to be worsening.   Health maintenance = gave flu shot at this visit

## 2017-06-08 LAB — SEDIMENTATION RATE: Sed Rate: 41 mm/h — ABNORMAL HIGH (ref 0–20)

## 2017-06-08 LAB — C-REACTIVE PROTEIN: CRP: 3.1 mg/L (ref <8.0)

## 2017-06-25 ENCOUNTER — Other Ambulatory Visit: Payer: Self-pay | Admitting: Internal Medicine

## 2017-06-25 DIAGNOSIS — G062 Extradural and subdural abscess, unspecified: Secondary | ICD-10-CM

## 2017-07-12 ENCOUNTER — Ambulatory Visit (INDEPENDENT_AMBULATORY_CARE_PROVIDER_SITE_OTHER): Payer: BLUE CROSS/BLUE SHIELD | Admitting: Internal Medicine

## 2017-07-12 ENCOUNTER — Encounter: Payer: Self-pay | Admitting: Internal Medicine

## 2017-07-12 VITALS — BP 123/77 | HR 80 | Temp 97.7°F | Ht 70.0 in | Wt 167.0 lb

## 2017-07-12 DIAGNOSIS — Z23 Encounter for immunization: Secondary | ICD-10-CM | POA: Diagnosis not present

## 2017-07-12 DIAGNOSIS — M4642 Discitis, unspecified, cervical region: Secondary | ICD-10-CM

## 2017-07-12 DIAGNOSIS — L409 Psoriasis, unspecified: Secondary | ICD-10-CM | POA: Diagnosis not present

## 2017-07-12 NOTE — Progress Notes (Signed)
RFV: follow up for discitis  Patient ID: Thomas Nielsen, male   DOB: 01/06/1956, 61 y.o.   MRN: [id number]  HPI [name] is a 61 yo M with history of MSSA discitis.In march 2018, he was found to have MSSA bacteremia c/b epidural abscess and C5-C7 vertebral osteo s/pC6-7 ACDF for epidural abscess. He was initially on cefazolin plus rifampin but developed a significant maculopapular rash from the cephalosporin for which we had to treat him with prednisone taper and switched to daptomycin. In follow up with Dr Ronnald Ramp he was found to have significant kyphosis from progressive subsidence of the C6 vertebral body secondary to softening of the bone from the osteomyelitis.He recommended a posterior cervical fixation C4-T2, for which he underwent on evening of 3/23. He tolerated his surgery without difficulty. He had maintained being on daptomycin with out any new rash or muscle aches.he has been on doxycycline for chronic suppression. He was last seen in early June since then he feels that he continues to improve.    He notices that he has had no improvement with increased mobility to his right shoulder. Still remains limited. Unable to raise above head. Last seen in early September but in the last month, not much improved. Occasional neck pain but overall doing well  Lab Results  Component Value Date   ESRSEDRATE 41 (H) 06/07/2017   Lab Results  Component Value Date   CRP 3.1 06/07/2017     Outpatient Encounter Prescriptions as of 07/12/2017  Medication Sig  . Ascorbic Acid (VITAMIN C) 1000 MG tablet Take 1,000 mg by mouth daily.  . calcium carbonate (OS-CAL) 600 MG TABS Take 600 mg by mouth daily.  Marland Kitchen doxycycline (VIBRA-TABS) 100 MG tablet TAKE 1 TABLET BY MOUTH TWICE A DAY  . enalapril (VASOTEC) 10 MG tablet Take 10 mg by mouth daily.   . predniSONE (DELTASONE) 10 MG tablet Take 4 tablets (40 mg total) by mouth daily with breakfast. X 2 days, then 3 tabs QD x 2 d., Then 2 tabs QD x 3 d., then 1  tab QD  . predniSONE (DELTASONE) 5 MG tablet Take 1 tablet (5 mg total) by mouth daily.  . [DISCONTINUED] amitriptyline (ELAVIL) 25 MG tablet TAKE 1 TABLET BY MOUTH BEFORE BEDTIME  . [DISCONTINUED] hydrOXYzine (ATARAX/VISTARIL) 10 MG tablet Take 1 tablet (10 mg total) by mouth 3 (three) times daily as needed. (Patient not taking: Reported on 03/07/2017)  . [DISCONTINUED] oxyCODONE (OXY IR/ROXICODONE) 5 MG immediate release tablet Take 1 tablet (5 mg total) by mouth every 6 (six) hours as needed for breakthrough pain. (Patient not taking: Reported on 03/07/2017)  . [DISCONTINUED] tamsulosin (FLOMAX) 0.4 MG CAPS capsule Take 1 capsule (0.4 mg total) by mouth daily after supper. (Patient not taking: Reported on 01/05/2017)   No facility-administered encounter medications on file as of 07/12/2017.      Patient Active Problem List   Diagnosis Date Noted  . 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 Eczema   . Drug rash   . Insomnia   . Sinus tachycardia   . S/P cervical spinal fusion 12/23/2016  . 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  . Surgery, elective   . Staph aureus infection   . Nephrotic syndrome   . 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  . Anemia, unspecified 09/06/2013  . 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 HTN (hypertension)   . Hyperlipidemia   . ITP (idiopathic thrombocytopenic purpura)   . Stage  3 chronic kidney disease [facility])      Health Maintenance Due  Topic Date Due  . TETANUS/TDAP  12/24/1972  . COLONOSCOPY  12/25/2003  . INFLUENZA VACCINE  05/03/2017     Review of Systems Per hpi,otherwise 12 point ros is negative Physical Exam   BP 123/77   Pulse 80   Temp 97.7 F (36.5 C) (Oral)   Ht 5\' 10"  (1.778 m)   Wt 167 lb (75.8 kg)   BMI 23.96 kg/m   Physical Exam  Constitutional:  oriented to person, place, and time. appears well-developed and well-nourished. No distress.  HENT: [place]/AT, PERRLA, no scleral  icterus Mouth/Throat: Oropharynx is clear and moist. No oropharyngeal exudate.  Neck = supple, no nuchal rigidity, surgical scar well healed Skin: Skin of hands bilaterally are dry and erythematous from underying ezcema/psoriasis Psychiatric: a normal mood and affect.  behavior is normal.    CBC Lab Results  Component Value Date   WBC 7.8 02/02/2017   RBC 3.59 (L) 02/02/2017   HGB 10.1 (L) 02/02/2017   HCT 30.3 (L) 02/02/2017   PLT 238 02/02/2017   MCV 84.4 02/02/2017   MCH 28.1 02/02/2017   MCHC 33.3 02/02/2017   RDW 15.1 (H) 02/02/2017   LYMPHSABS 624 (L) 02/02/2017   MONOABS 546 02/02/2017   EOSABS 234 02/02/2017    BMET Lab Results  Component Value Date   NA 137 12/21/2016   K 4.2 12/21/2016   CL 101 12/21/2016   CO2 24 12/21/2016   GLUCOSE 115 (H) 12/21/2016   BUN 21 (H) 12/21/2016   CREATININE 1.11 12/21/2016   CALCIUM 10.0 12/21/2016   GFRNONAA >60 12/21/2016   GFRAA >60 12/21/2016   Lab Results  Component Value Date   ESRSEDRATE 28 (H) 07/12/2017   Lab Results  Component Value Date   CRP 1.3 07/12/2017      Assessment and Plan  Discitis = will check sed rate and crp, plan on continuing with doxy for addn 4-6 wk. Nearly normalized  Health maintenance = will give - flu vaccine  Eczema/psoriasis = recommend to try - eucerin and vaseline

## 2017-07-12 NOTE — Patient Instructions (Signed)
For dry skin - would try 50% eucerin/50% vaseline combination to apply to affected areas right after bathing  Will see you back in 4-6 wk

## 2017-07-13 LAB — C-REACTIVE PROTEIN: CRP: 1.3 mg/L (ref <8.0)

## 2017-07-13 LAB — SEDIMENTATION RATE: Sed Rate: 28 mm/h — ABNORMAL HIGH (ref 0–20)

## 2017-07-25 ENCOUNTER — Other Ambulatory Visit: Payer: Self-pay | Admitting: Internal Medicine

## 2017-07-25 DIAGNOSIS — G062 Extradural and subdural abscess, unspecified: Secondary | ICD-10-CM

## 2017-08-10 ENCOUNTER — Encounter: Payer: Self-pay | Admitting: Internal Medicine

## 2017-08-10 ENCOUNTER — Ambulatory Visit (INDEPENDENT_AMBULATORY_CARE_PROVIDER_SITE_OTHER): Payer: BLUE CROSS/BLUE SHIELD | Admitting: Internal Medicine

## 2017-08-10 VITALS — BP 123/83 | HR 92 | Temp 98.3°F | Wt 170.0 lb

## 2017-08-10 DIAGNOSIS — M4642 Discitis, unspecified, cervical region: Secondary | ICD-10-CM

## 2017-08-10 DIAGNOSIS — G062 Extradural and subdural abscess, unspecified: Secondary | ICD-10-CM | POA: Diagnosis not present

## 2017-08-10 DIAGNOSIS — L309 Dermatitis, unspecified: Secondary | ICD-10-CM

## 2017-08-10 MED ORDER — DOXYCYCLINE HYCLATE 100 MG PO TABS: 100.0000 mg | ORAL_TABLET | Freq: Two times a day (BID) | ORAL | 3 refills | Status: DC

## 2017-08-10 NOTE — Progress Notes (Signed)
RFV: follow for MSSA bacteremia and cervical discitis/epidural abscess  Patient ID: Thomas Nielsen, male   DOB: [birth year], 61 y.o.   MRN: [id number]  HPI [id number] M with complicated hx of  MSSA bacteremia--with epidural abscess and C5-C7 vertebral osteo s/pC6-7 ACDF for epidural abscess. He was initially on cefazolin plus rifampin but developed a significant maculopapular rash from the cephalosporin for which we had to treat him with prednisone taper and switched to daptomycin. Patient developed significant kyphosis from progressive C6 vertebral body softening of the bone from the osteomyelitis s/p posterior cervical fixation C4-T2, on 3/23. patient has been on chronic suppression with doxycycline  He reports that he is doing well. Less neck pain but still has some weakness to his right arm for which dr Ronnald Ramp does not feel may regain due to nerve damage from original cervical compression.  No fever, chills, nightsweats  Lab Results  Component Value Date   ESRSEDRATE 28 (H) 07/12/2017   Lab Results  Component Value Date   CRP 1.3 07/12/2017       Outpatient Encounter Medications as of 08/10/2017  Medication Sig  . Ascorbic Acid (VITAMIN C) 1000 MG tablet Take 1,000 mg by mouth daily.  . calcium carbonate (OS-CAL) 600 MG TABS Take 600 mg by mouth daily.  Marland Kitchen doxycycline (VIBRA-TABS) 100 MG tablet TAKE 1 TABLET BY MOUTH TWICE A DAY  . enalapril (VASOTEC) 10 MG tablet Take 10 mg by mouth daily.   . predniSONE (DELTASONE) 5 MG tablet Take 1 tablet (5 mg total) by mouth daily.  . [DISCONTINUED] predniSONE (DELTASONE) 10 MG tablet Take 4 tablets (40 mg total) by mouth daily with breakfast. X 2 days, then 3 tabs QD x 2 d., Then 2 tabs QD x 3 d., then 1 tab QD   No facility-administered encounter medications on file as of 08/10/2017.      Patient Active Problem List   Diagnosis Date Noted  . 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 Eczema   . Drug rash   . Insomnia   . Sinus  tachycardia   . S/P cervical spinal fusion 12/23/2016  . 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  . Surgery, elective   . Staph aureus infection   . Nephrotic syndrome   . 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  . Anemia, unspecified 09/06/2013  . 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 HTN (hypertension)   . Hyperlipidemia   . ITP (idiopathic thrombocytopenic purpura)   . Stage 3 chronic kidney disease [facility])      Health Maintenance Due  Topic Date Due  . TETANUS/TDAP  12/24/1972  . COLONOSCOPY  12/25/2003     Review of Systems  Physical Exam   BP 123/83   Pulse 92   Temp 98.3 F (36.8 C) (Oral)   Wt 170 lb (77.1 kg)   BMI 24.39 kg/m   Physical Exam  Constitutional: He is oriented to person, place, and time. He appears well-developed and well-nourished. No distress.  HENT:  Mouth/Throat: Oropharynx is clear and moist. No oropharyngeal exudate.  Neck = surgical site well healed He has no cervical adenopathy.  Neurological: He is alert and oriented to person, place, and time.  Skin: hands extremely dry Psychiatric: He has a normal mood and affect. His behavior is normal.     Lab Results  Component Value Date   HEPBSAB NEG 12/01/2010   No results found for: RPR, LABRPR  CBC Lab Results  Component Value Date   WBC 7.8 02/02/2017   RBC 3.59 (L) 02/02/2017   HGB 10.1 (L) 02/02/2017   HCT 30.3 (L) 02/02/2017   PLT 238 02/02/2017   MCV 84.4 02/02/2017   MCH 28.1 02/02/2017   MCHC 33.3 02/02/2017   RDW 15.1 (H) 02/02/2017   LYMPHSABS 624 (L) 02/02/2017   MONOABS 546 02/02/2017   EOSABS 234 02/02/2017    BMET Lab Results  Component Value Date   NA 137 12/21/2016   K 4.2 12/21/2016   CL 101 12/21/2016   CO2 24 12/21/2016   GLUCOSE 115 (H) 12/21/2016   BUN 21 (H) 12/21/2016   CREATININE 1.11 12/21/2016   CALCIUM 10.0 12/21/2016   GFRNONAA >60 12/21/2016   GFRAA >60 12/21/2016      Assessment and  Plan   mssa cervical discitis/osteo = continue on doxycycline 100mg  BID, plan to do for addn 2-3 months. Would like to ensure his inflammatory markers normalize  Dermatitis = thought to be due to ezcema, patient had previously seen dermatologist who has now retired. Will refer to [facility].

## 2017-08-17 ENCOUNTER — Telehealth: Payer: Self-pay

## 2017-08-17 NOTE — Telephone Encounter (Signed)
Called and left a message for patient to call office per Dr. Baxter Flattery. [facility] dermatology has tried to call patient 3 times to get an appointment scheduled. Thomas Nielsen

## 2017-09-28 ENCOUNTER — Telehealth: Payer: Self-pay | Admitting: *Deleted

## 2017-09-28 NOTE — Telephone Encounter (Signed)
Patient wants to back doxycycline 100 mg down to once daily instead of twice daily. He states he is experiencing stomach upset as well as redness/rash/peeling on his face since Saturday. He states this started during a period of rain, so he does not feel it is the sun-sensitivity due to doxycycline.  Please advise. Landis Gandy, RN

## 2017-09-29 NOTE — Telephone Encounter (Signed)
Drop him down to once a day

## 2017-10-04 NOTE — Telephone Encounter (Signed)
Left patient a message asking how he was doing on doxycycline 100mg  once daily.

## 2017-10-19 ENCOUNTER — Encounter: Payer: Self-pay | Admitting: Internal Medicine

## 2017-10-19 ENCOUNTER — Ambulatory Visit: Payer: BLUE CROSS/BLUE SHIELD | Admitting: Internal Medicine

## 2017-10-19 VITALS — BP 129/83 | HR 80 | Temp 98.4°F | Ht 71.0 in | Wt 171.0 lb

## 2017-10-19 DIAGNOSIS — A4901 Methicillin susceptible Staphylococcus aureus infection, unspecified site: Secondary | ICD-10-CM

## 2017-10-19 DIAGNOSIS — M4643 Discitis, unspecified, cervicothoracic region: Secondary | ICD-10-CM

## 2017-10-19 DIAGNOSIS — M4640 Discitis, unspecified, site unspecified: Secondary | ICD-10-CM

## 2017-10-20 LAB — SEDIMENTATION RATE: SED RATE: 31 mm/h — AB (ref 0–20)

## 2017-10-20 LAB — C-REACTIVE PROTEIN: CRP: 1.9 mg/L (ref <8.0)

## 2017-10-21 NOTE — Progress Notes (Signed)
RFV: MSSA cervical epidural abscess/discitis  Patient ID: Thomas Nielsen, male   DOB: Mar 17, 1956, 62 y.o.   MRN: [id number]  HPI 62yo M with disseminated MSSA infection s/p evacuation/debridement but then required stabilization due to instability of cervical spine. He has been on chronic doxy (due to ceph allergy) for many months. He was on bid dosing of doxy but had rash that he tolerates with once a day dosing. Overall he reports doing well. His eczema is not bad per his report thus he did not go to see dermatology has we had referred him at last visit.  Outpatient Encounter Medications as of 10/19/2017  Medication Sig  . calcium carbonate (OS-CAL) 600 MG TABS Take 600 mg by mouth daily.  Marland Kitchen doxycycline (VIBRA-TABS) 100 MG tablet Take 1 tablet (100 mg total) 2 (two) times daily by mouth. (Patient taking differently: Take 100 mg by mouth daily. )  . enalapril (VASOTEC) 10 MG tablet Take 10 mg by mouth daily.   . predniSONE (DELTASONE) 5 MG tablet Take 1 tablet (5 mg total) by mouth daily.  . Ascorbic Acid (VITAMIN C) 1000 MG tablet Take 1,000 mg by mouth daily.   No facility-administered encounter medications on file as of 10/19/2017.      Patient Active Problem List   Diagnosis Date Noted  . 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 Eczema   . Drug rash   . Insomnia   . Sinus tachycardia   . S/P cervical spinal fusion 12/23/2016  . 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  . Surgery, elective   . Staph aureus infection   . Nephrotic syndrome   . 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  . Anemia, unspecified 09/06/2013  . 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 HTN (hypertension)   . Hyperlipidemia   . ITP (idiopathic thrombocytopenic purpura)   . Stage 3 chronic kidney disease [facility])      Health Maintenance Due  Topic Date Due  . TETANUS/TDAP  12/24/1972  . COLONOSCOPY  12/25/2003     Review of Systems No complaints occasionally has  neck and shoulder pain with certain positions. Otherwise 12 point ros is negative Physical Exam   BP 129/83   Pulse 80   Temp 98.4 F (36.9 C) (Oral)   Ht 5\' 11"  (1.803 m)   Wt 171 lb (77.6 kg)   BMI 23.85 kg/m    Physical Exam  Constitutional: He is oriented to person, place, and time. He appears well-developed and well-nourished. No distress.  HENT:  Mouth/Throat: Oropharynx is clear and moist. No oropharyngeal exudate.  Cardiovascular: Normal rate, regular rhythm and normal heart sounds. Exam reveals no gallop and no friction rub.  No murmur heard.  Pulmonary/Chest: Effort normal and breath sounds normal. No respiratory distress. He has no wheezes.  Abdominal: Soft. Bowel sounds are normal. He exhibits no distension. There is no tenderness.  Neurological: He is alert and oriented to person, place, and time.  Skin: Skin is slightly diffusely erythamatous to face, chest, torso, arms. His hands are very dry with some areas of being cracked Psychiatric: He has a normal mood and affect. His behavior is normal.    Lab Results  Component Value Date   HEPBSAB NEG 12/01/2010   No results found for: RPR, LABRPR  CBC Lab Results  Component Value Date   WBC 7.8 02/02/2017   RBC 3.59 (L) 02/02/2017   HGB 10.1 (L) 02/02/2017   HCT 30.3 (L) 02/02/2017  PLT 238 02/02/2017   MCV 84.4 02/02/2017   MCH 28.1 02/02/2017   MCHC 33.3 02/02/2017   RDW 15.1 (H) 02/02/2017   LYMPHSABS 624 (L) 02/02/2017   MONOABS 546 02/02/2017   EOSABS 234 02/02/2017    BMET Lab Results  Component Value Date   NA 137 12/21/2016   K 4.2 12/21/2016   CL 101 12/21/2016   CO2 24 12/21/2016   GLUCOSE 115 (H) 12/21/2016   BUN 21 (H) 12/21/2016   CREATININE 1.11 12/21/2016   CALCIUM 10.0 12/21/2016   GFRNONAA >60 12/21/2016   GFRAA >60 12/21/2016   Lab Results  Component Value Date   ESRSEDRATE 31 (H) 10/19/2017   Lab Results  Component Value Date   CRP 1.9 10/19/2017      Assessment and  Plan  mssa discitis = continue with doxycycline 100mg  daily until this current prescription is complete. We will see him in 4-6 wk roughly 2-4 wks off of abtx to see how he is doing.  Eczema = I suspect that doxycycline maybe exacerbating some of his symptoms. We will see how he looks off of meds. Encouraged using eucerin/vaseline combination to his skin to keep lubricated.

## 2017-12-05 ENCOUNTER — Encounter: Payer: Self-pay | Admitting: Internal Medicine

## 2017-12-05 ENCOUNTER — Ambulatory Visit: Payer: BLUE CROSS/BLUE SHIELD | Admitting: Internal Medicine

## 2017-12-05 VITALS — BP 129/86 | HR 80 | Temp 98.0°F | Ht 71.0 in | Wt 175.0 lb

## 2017-12-05 DIAGNOSIS — A4901 Methicillin susceptible Staphylococcus aureus infection, unspecified site: Secondary | ICD-10-CM

## 2017-12-05 DIAGNOSIS — M4642 Discitis, unspecified, cervical region: Secondary | ICD-10-CM | POA: Diagnosis not present

## 2017-12-05 DIAGNOSIS — L309 Dermatitis, unspecified: Secondary | ICD-10-CM | POA: Diagnosis not present

## 2017-12-05 NOTE — Progress Notes (Signed)
RFV: follow up for mssa cervical discitis/osteo  Patient ID: Thomas Nielsen, male   DOB: 04/16/1956, 62 y.o.   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 Doing well. Has been off of doxycycline ( x 4 wk) - cephalosporin allergy. Feels unchanged  Outpatient Encounter Medications as of 12/05/2017  Medication Sig  . calcium carbonate (OS-CAL) 600 MG TABS Take 600 mg by mouth daily.  . enalapril (VASOTEC) 10 MG tablet Take 10 mg by mouth daily.   . predniSONE (DELTASONE) 5 MG tablet Take 1 tablet (5 mg total) by mouth daily.  . Ascorbic Acid (VITAMIN C) 1000 MG tablet Take 1,000 mg by mouth daily.  Marland Kitchen doxycycline (VIBRA-TABS) 100 MG tablet Take 1 tablet (100 mg total) 2 (two) times daily by mouth. (Patient not taking: Reported on 12/05/2017)   No facility-administered encounter medications on file as of 12/05/2017.      Patient Active Problem List   Diagnosis Date Noted  . Osteomyelitis of cervical spine ([place])   . Eczema   . Drug rash   . Insomnia   . Sinus tachycardia   . S/P cervical spinal fusion 12/23/2016  . 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  . Surgery, elective   . Staph aureus infection   . Nephrotic syndrome   . 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  . Anemia, unspecified 09/06/2013  . 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 HTN (hypertension)   . Hyperlipidemia   . ITP (idiopathic thrombocytopenic purpura)   . Stage 3 chronic kidney disease [facility])      Health Maintenance Due  Topic Date Due  . TETANUS/TDAP  12/24/1972  . COLONOSCOPY  12/25/2003     Review of Systems + dry skin, otherwise 12 point ros is negative Physical Exam   BP 129/86   Pulse 80   Temp 98 F (36.7 C) (Oral)   Ht 5\' 11"  (1.803 m)   Wt 175 lb (79.4 kg)   BMI 24.41 kg/m   Physical Exam  Constitutional:  oriented to person, place, and time. appears well-developed  and well-nourished. No distress.  HENT: [place]/AT, PERRLA, no scleral icterus Mouth/Throat: Oropharynx is clear and moist. No oropharyngeal exudate.  Cardiovascular: Normal rate, regular rhythm and normal heart sounds. Exam reveals no gallop and no friction rub.  No murmur heard.  Pulmonary/Chest: Effort normal and breath sounds normal. No respiratory distress.  has no wheezes.  Neck = supple, no nuchal rigidity Skin: dry hands and ruddiness to face, unchanged Psychiatric: a normal mood and affect.  behavior is normal.   Lab Results  Component Value Date   HEPBSAB NEG 12/01/2010   No results found for: RPR, LABRPR  CBC Lab Results  Component Value Date   WBC 7.8 02/02/2017   RBC 3.59 (L) 02/02/2017   HGB 10.1 (L) 02/02/2017   HCT 30.3 (L) 02/02/2017   PLT 238 02/02/2017   MCV 84.4 02/02/2017   MCH 28.1 02/02/2017   MCHC 33.3 02/02/2017   RDW 15.1 (H) 02/02/2017   LYMPHSABS 624 (L) 02/02/2017   MONOABS 546 02/02/2017   EOSABS 234 02/02/2017    BMET Lab Results  Component Value Date   NA 137 12/21/2016   K 4.2 12/21/2016   CL 101 12/21/2016   CO2 24 12/21/2016   GLUCOSE 115 (H) 12/21/2016   BUN 21 (H) 12/21/2016   CREATININE 1.11 12/21/2016   CALCIUM 10.0 12/21/2016  GFRNONAA >60 12/21/2016   GFRAA >60 12/21/2016    Lab Results  Component Value Date   ESRSEDRATE 31 (H) 10/19/2017   Lab Results  Component Value Date   CRP 1.9 10/19/2017    Lab Results  Component Value Date   ESRSEDRATE 34 (H) 12/05/2017   Lab Results  Component Value Date   CRP 0.5 12/05/2017    Assessment and Plan Off of doxy x 4 wk no new issues  will check sed rate and crp to see how he is doing - which appear similar to when he was on doxy suppression  Will see him back if needed  Ezcema/dermatitis = continue with hydration of skin, if worsens see dermatology

## 2017-12-06 LAB — SEDIMENTATION RATE: SED RATE: 34 mm/h — AB (ref 0–20)

## 2017-12-06 LAB — C-REACTIVE PROTEIN: CRP: 0.5 mg/L (ref <8.0)

## 2018-12-31 IMAGING — MR MR CERVICAL SPINE WO/W CM
4 of 8 series · 21 of 48 positions shown · IV contrast (multihance)
Comparison: None.

CLINICAL DATA: Neck pain, fever, and weakness.  Fall.

EXAM:
MRI CERVICAL SPINE WITHOUT AND WITH CONTRAST
TECHNIQUE: Multiplanar and multiecho pulse sequences of the cervical spine, to
include the craniocervical junction and cervicothoracic junction,
were obtained without and with intravenous contrast.
CONTRAST:  7 mL MultiHance

[Series 3: T1 · sagittal · 3.0mm · 0.41mm/px · 4 of 14 slices shown (1 of 2)]
[im 1/14]
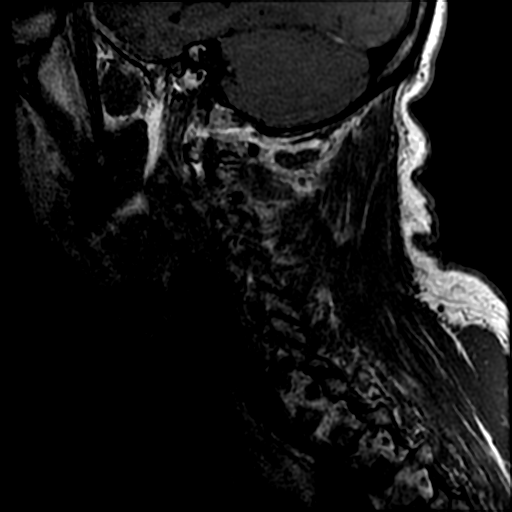
[im 5/14]
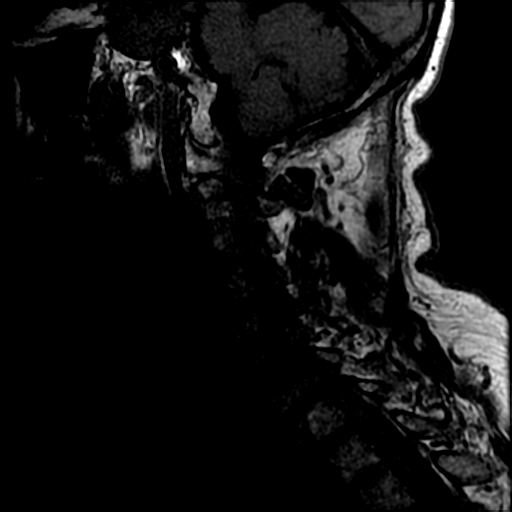
[im 9/14]
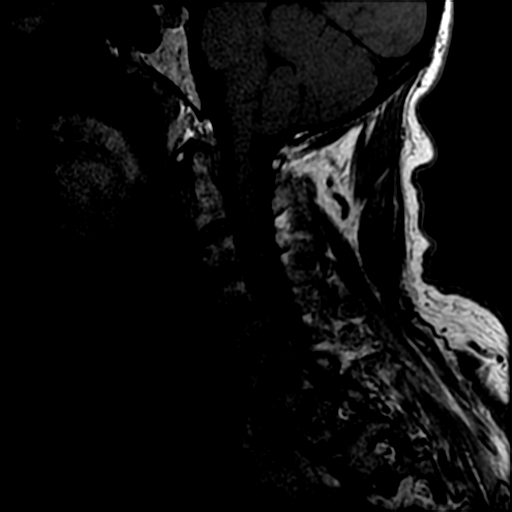
[im 14/14]
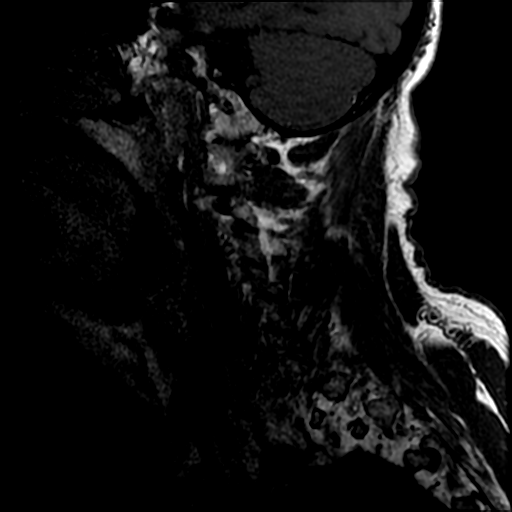

[Series 6: T2 · axial · 3.1mm · 0.37mm/px · z∈[-69,+32]mm · 8 of 31 slices shown]
[im 1/31]
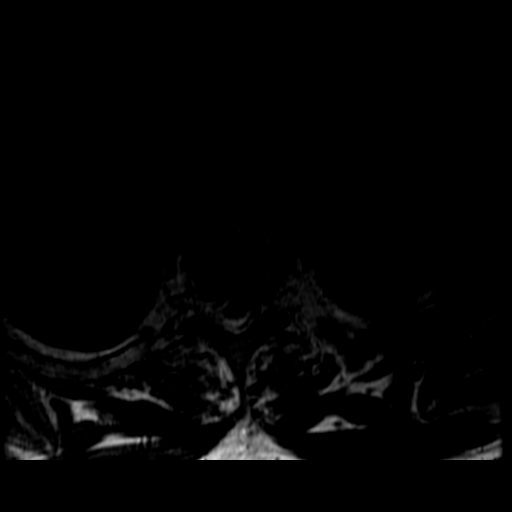
[im 5/31]
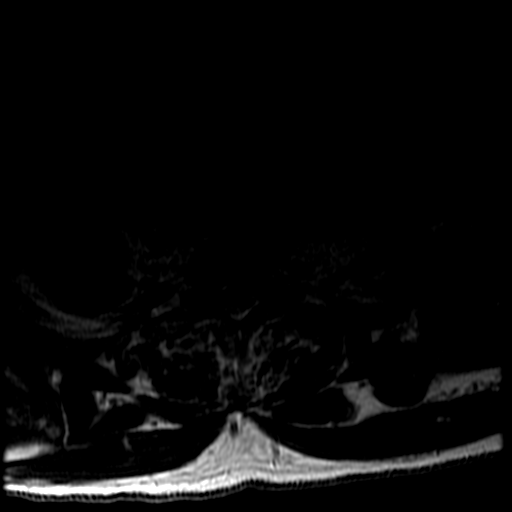
[im 9/31]
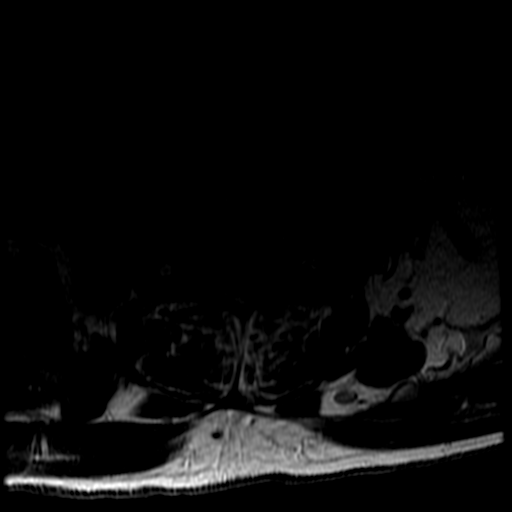
[im 13/31]
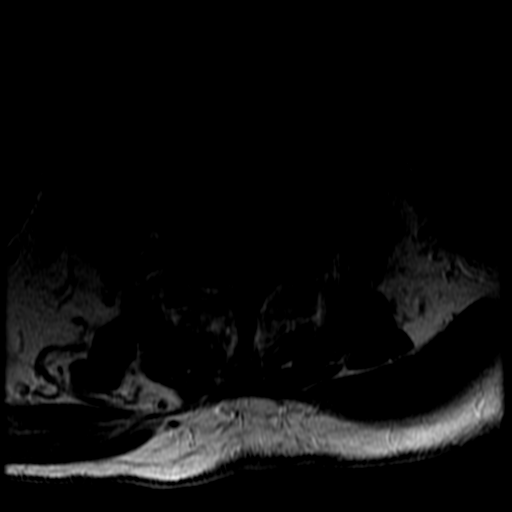
[im 18/31]
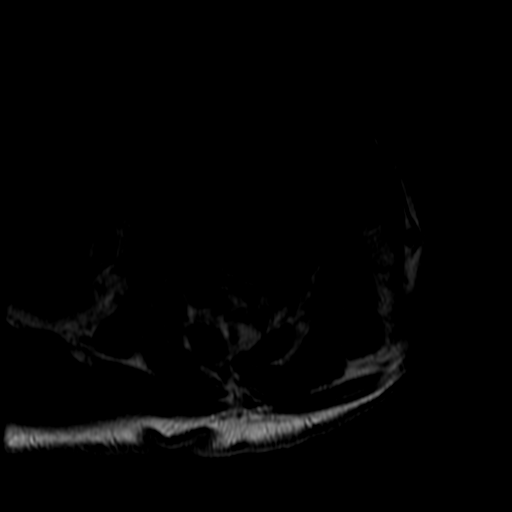
[im 22/31]
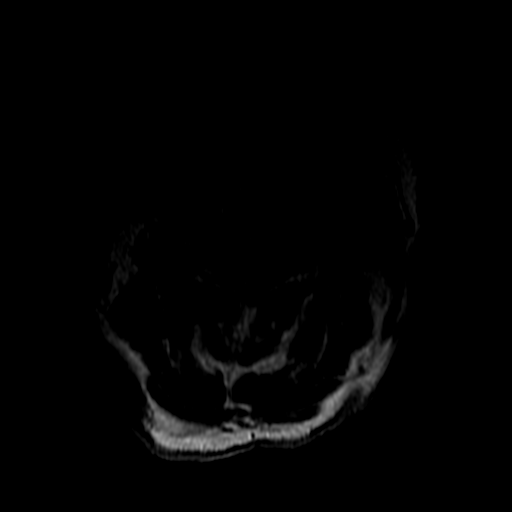
[im 26/31]
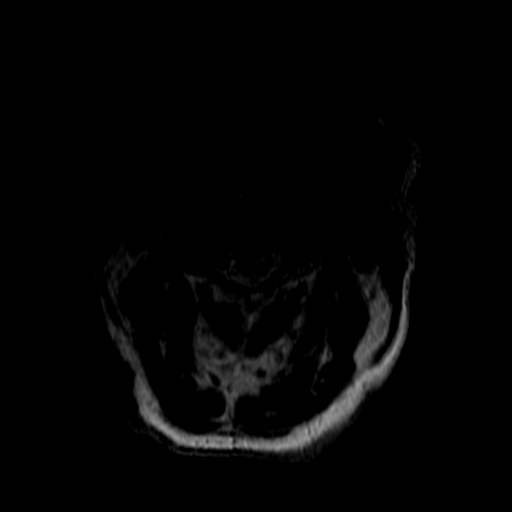
[im 31/31]
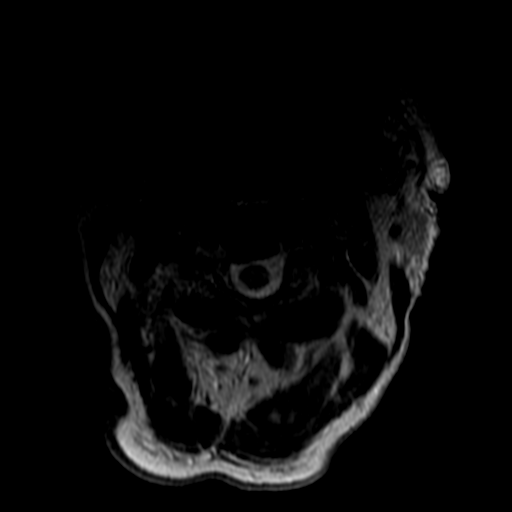

[Series 7: T1 · axial · non-contrast · 3.1mm · 0.37mm/px · z∈[-69,+16]mm · 5 of 32 slices shown (2 of 2)]
[im 1/32]
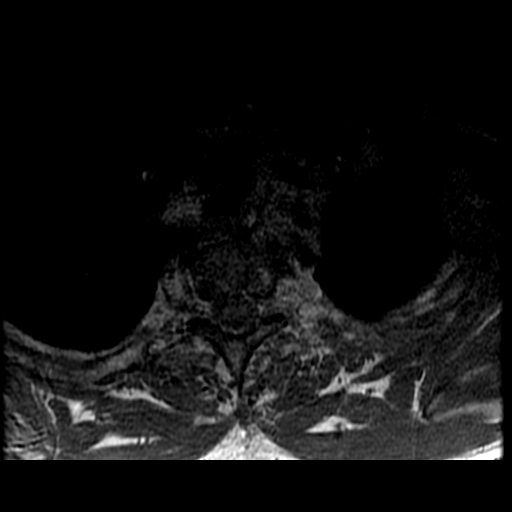
[im 5/32]
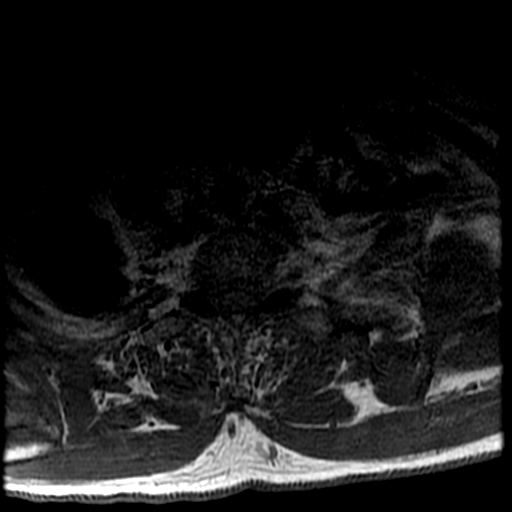
[im 9/32]
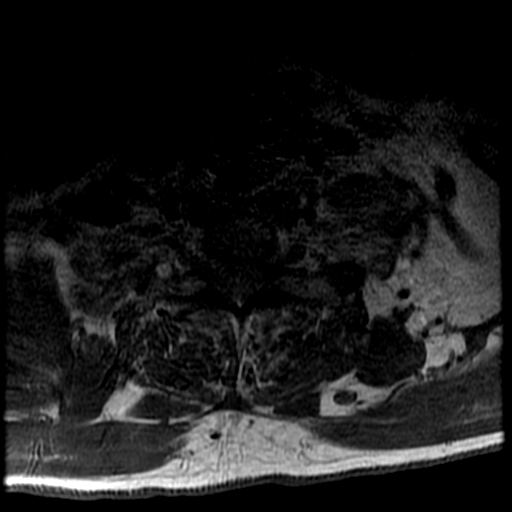
[im 18/32]
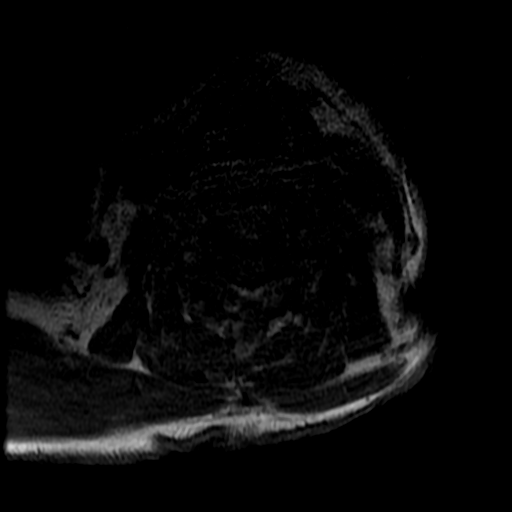
[im 27/32]
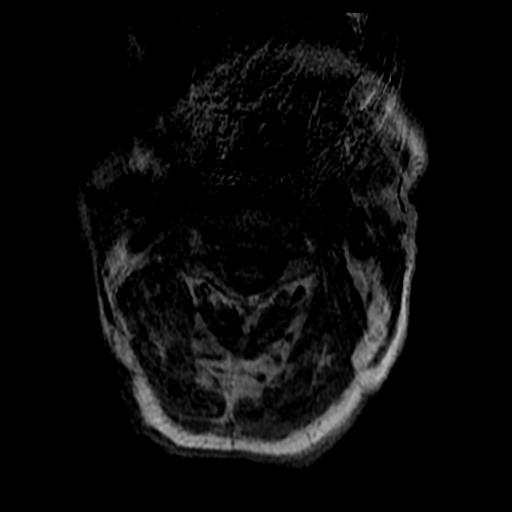

[Series 8: T2 post-contrast · sagittal · 3.0mm · 0.82mm/px · 4 of 14 slices shown]
[im 1/14]
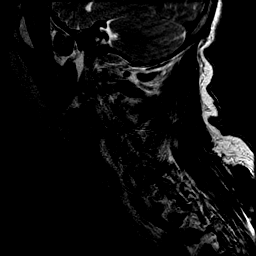
[im 5/14]
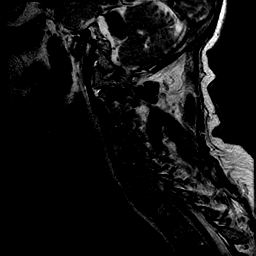
[im 9/14]
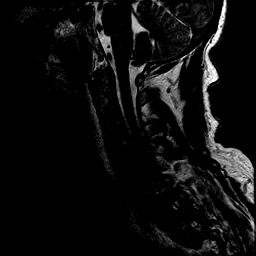
[im 14/14]
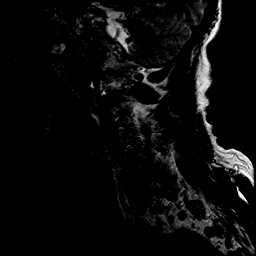

[21 of 48 positions shown; findings below may reference images not displayed]

FINDINGS: The study is moderately to severely motion degraded throughout.

Alignment: Trace retrolisthesis of C6 on C7.

Vertebrae: Minimal endplate edema and enhancement at C6-7 with
moderate to severe disc space narrowing. Only minimal STIR
hyperintensity in the disc.

Cord: Limited assessment due to motion artifact. Questionable T2
hyperintensity in the cord at the C7-T1 level on the sagittal T2
sequence.

Posterior Fossa, vertebral arteries, paraspinal tissues: Diffuse
prevertebral edema and enhancement throughout the cervical and
visualized upper thoracic spine. Small volume prevertebral fluid,
greatest at C4 with AP thickness of 5 mm. Mild edema in the
posterior cervical paraspinal soft tissues including in the
interspinous soft tissues in the mid and lower cervical spine.

Disc levels:

There is extensive circumferential epidural enhancement throughout
the cervical spine and extending into the visualized upper thoracic
spine. A nonenhancing T2 hyperintense ventral epidural collection at
C6-7 measures approximately 2 cm in craniocaudal length and 6 mm in
AP thickness and results in severe spinal stenosis with moderate to
severe cord compression.
IMPRESSION: 1. Extensive epidural enhancement throughout the cervical and
visualized upper thoracic spine consistent with infectious phlegmon.
2. 2 cm ventral epidural abscess at C6-7 with severe spinal stenosis
and cord compression.
3. Mild endplate edema and enhancement at C6-7 which may be
degenerative or reflect osteomyelitis.
4. Diffuse prevertebral edema/phlegmon and small volume fluid.
Critical Value/emergent results were called by telephone at the time
of interpretation on 11/17/2016 at [DATE] to Dr. SHAZIA SCOTTI ,
who verbally acknowledged these results.

## 2018-12-31 IMAGING — RF DG C-ARM 61-120 MIN
1 series · 2 of 2 positions shown · non-contrast
Comparison: MRI cervical spine earlier today.

CLINICAL DATA: Intraoperative imaging for C6-7 ACDF.

EXAM:
CERVICAL SPINE - 2-3 VIEW; DG C-ARM 61-120 MIN

[Series 1: run · 2 of 2 slices shown]
[im 1/2]
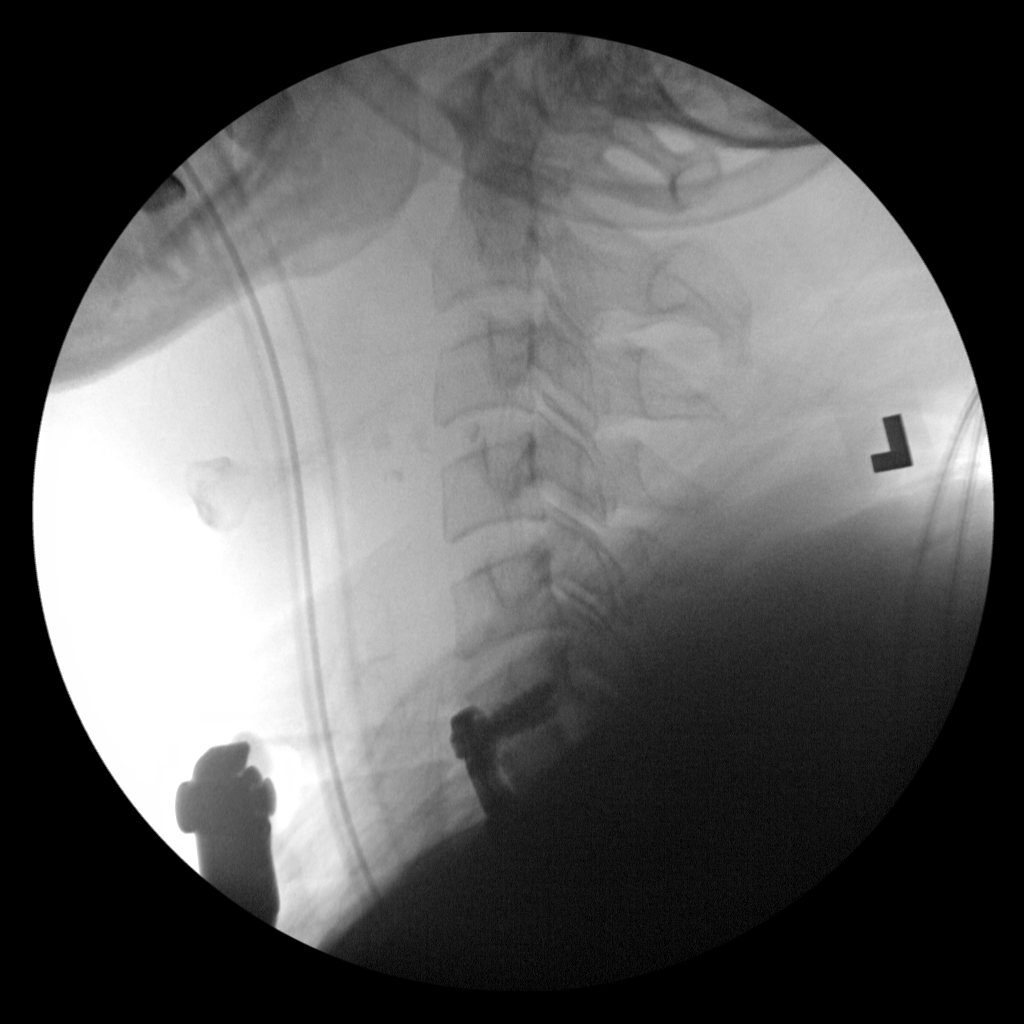
[im 2/2]
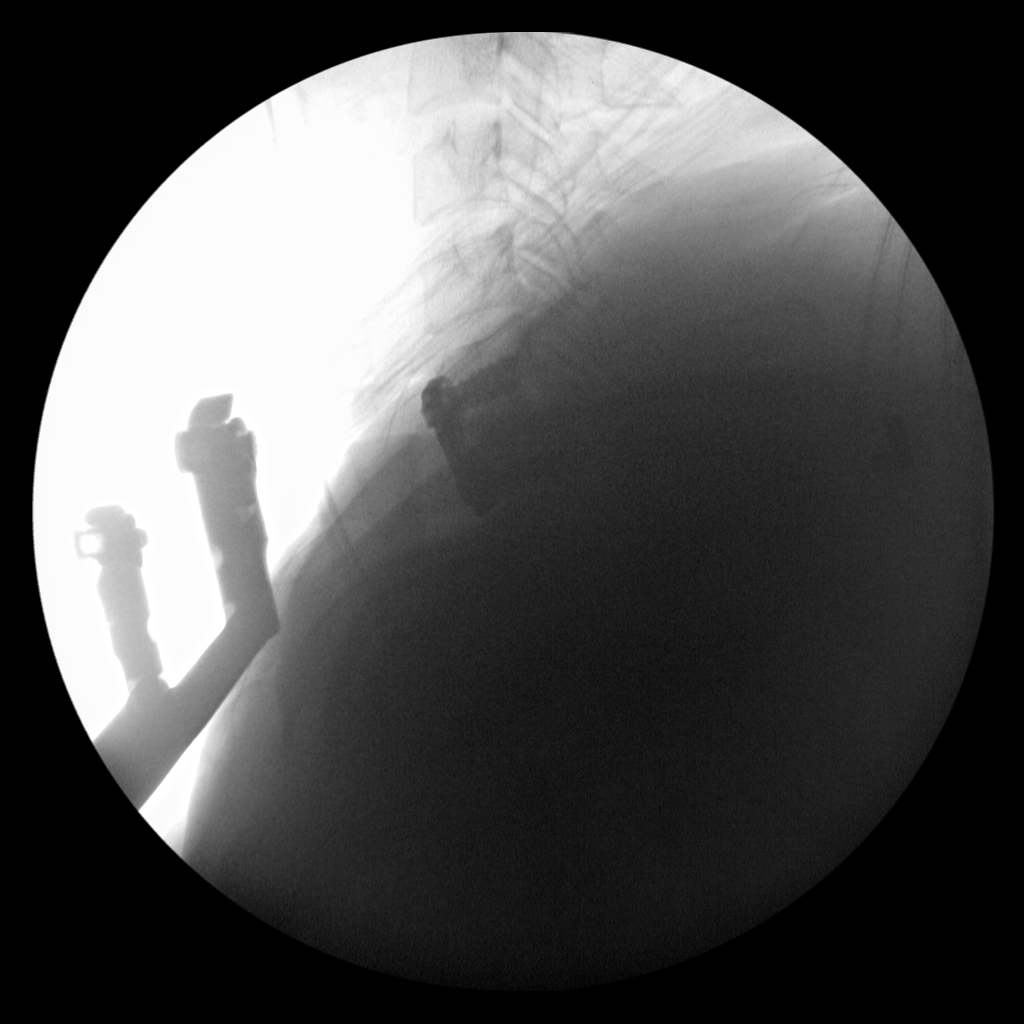

[2 of 2 positions shown; findings below may reference images not displayed]

FINDINGS: Two fluoroscopic intraoperative spot views of the cervical spine in
the lateral projection demonstrate anterior plate and screws
interbody spacer in place at C6-7. No acute finding.
IMPRESSION: C6-7 ACDF.

## 2019-01-23 DIAGNOSIS — I10 Essential (primary) hypertension: Secondary | ICD-10-CM | POA: Diagnosis not present

## 2019-01-23 DIAGNOSIS — N032 Chronic nephritic syndrome with diffuse membranous glomerulonephritis: Secondary | ICD-10-CM | POA: Diagnosis not present

## 2019-01-23 DIAGNOSIS — D693 Immune thrombocytopenic purpura: Secondary | ICD-10-CM | POA: Diagnosis not present

## 2019-01-23 DIAGNOSIS — Z9889 Other specified postprocedural states: Secondary | ICD-10-CM | POA: Diagnosis not present

## 2019-01-23 DIAGNOSIS — L309 Dermatitis, unspecified: Secondary | ICD-10-CM | POA: Diagnosis not present

## 2020-05-22 DIAGNOSIS — N032 Chronic nephritic syndrome with diffuse membranous glomerulonephritis: Secondary | ICD-10-CM | POA: Diagnosis not present

## 2020-05-22 DIAGNOSIS — R7309 Other abnormal glucose: Secondary | ICD-10-CM | POA: Diagnosis not present

## 2020-05-22 DIAGNOSIS — I10 Essential (primary) hypertension: Secondary | ICD-10-CM | POA: Diagnosis not present

## 2020-05-22 DIAGNOSIS — D693 Immune thrombocytopenic purpura: Secondary | ICD-10-CM | POA: Diagnosis not present

## 2020-05-22 DIAGNOSIS — R062 Wheezing: Secondary | ICD-10-CM | POA: Diagnosis not present

## 2020-05-22 DIAGNOSIS — E785 Hyperlipidemia, unspecified: Secondary | ICD-10-CM | POA: Diagnosis not present

## 2020-05-25 ENCOUNTER — Other Ambulatory Visit (HOSPITAL_COMMUNITY): Payer: Self-pay | Admitting: Nephrology

## 2020-05-25 DIAGNOSIS — R062 Wheezing: Secondary | ICD-10-CM

## 2020-10-02 DIAGNOSIS — J069 Acute upper respiratory infection, unspecified: Secondary | ICD-10-CM | POA: Diagnosis not present

## 2020-10-02 DIAGNOSIS — Z20828 Contact with and (suspected) exposure to other viral communicable diseases: Secondary | ICD-10-CM | POA: Diagnosis not present

## 2021-01-12 DIAGNOSIS — M109 Gout, unspecified: Secondary | ICD-10-CM | POA: Diagnosis not present

## 2021-01-12 DIAGNOSIS — N529 Male erectile dysfunction, unspecified: Secondary | ICD-10-CM | POA: Diagnosis not present

## 2021-03-29 ENCOUNTER — Ambulatory Visit
Admission: EM | Admit: 2021-03-29 | Discharge: 2021-03-29 | Disposition: A | Discharge status: Home / Self Care | Payer: PPO | Attending: Internal Medicine | Admitting: Internal Medicine

## 2021-03-29 ENCOUNTER — Other Ambulatory Visit: Payer: Self-pay

## 2021-03-29 ENCOUNTER — Encounter: Payer: Self-pay | Admitting: Emergency Medicine

## 2021-03-29 DIAGNOSIS — H6123 Impacted cerumen, bilateral: Secondary | ICD-10-CM

## 2021-03-29 NOTE — ED Provider Notes (Signed)
[facility]    CSN: [id number] Arrival date & time: 03/29/21  1402      History   Chief Complaint Chief Complaint  Patient presents with   Left Ear Blockage    HPI Thomas Nielsen is a 65 y.o. male presents to urgent care today with feeling like left ear "blocked". Pt states symptoms ongoing x 10 days. Symptoms associated with decreased hearing in affected ear. Hx of impacted cerumen before requiring irrigation. Has not done anything for symptoms.     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    Patient Active Problem List   Diagnosis Date Noted   Osteomyelitis of cervical spine ([facility])    Eczema    Drug rash    Insomnia    Sinus tachycardia    S/P cervical spinal fusion 12/23/2016   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   Surgery, elective    Staph aureus infection    Nephrotic syndrome    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   Anemia, unspecified 09/06/2013   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   HTN (hypertension)    Hyperlipidemia    ITP (idiopathic thrombocytopenic purpura)    Stage 3 chronic kidney disease ([facility])     Past Surgical History:  Procedure Laterality Date   ANTERIOR CERVICAL DECOMPRESSION FOR EPIDURAL ABSCESS N/A 11/17/2016   Procedure: ANTERIOR CERVICAL DECOMPRESSION FOR EPIDURAL ABSCESS CERVICAL SIX-SEVEN;  Surgeon: Eustace Moore, MD;  Location: [place];  Service: Neurosurgery;  Laterality: N/A;   CERVICAL WOUND DEBRIDEMENT N/A 11/26/2016   Procedure: CERVICAL WOUND EXPLORATION, IRRIGATION DEBRIDEMENT;  Surgeon: Kevan Ny Ditty, MD;  Location: [place];  Service: Neurosurgery;  Laterality: N/A;   HERNIA REPAIR  Right 2006/2007   inguinal (done twice)   kidney biospy     POSTERIOR CERVICAL FUSION/FORAMINOTOMY N/A 12/23/2016   Procedure: Posterior Cervical Fusion with lateral mass fixation - Cervical Four - Thoracic Two;  Surgeon: Eustace Moore, MD;  Location: [place];  Service: Neurosurgery;  Laterality: N/A;   TEE WITHOUT CARDIOVERSION N/A 11/29/2016   Procedure: TRANSESOPHAGEAL ECHOCARDIOGRAM (TEE);  Surgeon: Skeet Latch, MD;  Location: [facility] ENDOSCOPY;  Service: Cardiovascular;  Laterality: N/A;       Home Medications    Prior to Admission medications   Medication Sig Start Date End Date Taking? Authorizing Provider  Ascorbic Acid (VITAMIN C) 1000 MG tablet Take 1,000 mg by mouth daily.   Yes [provider]  calcium carbonate (OS-CAL) 600 MG TABS Take 600 mg by mouth daily.   Yes [provider]  enalapril (VASOTEC) 10 MG tablet Take 10 mg by mouth daily.  12/02/11  Yes [provider]  predniSONE (DELTASONE) 5 MG tablet Take 1 tablet (5 mg total) by mouth daily. 01/31/12  Yes Sherryl Manges T, MD  doxycycline (VIBRA-TABS) 100 MG tablet Take 1 tablet (100 mg total) 2 (two) times daily by mouth. Patient not taking: Reported on 12/05/2017 08/10/17   Carlyle Basques, MD    Family History Family History  Problem Relation Age of Onset   Cancer Father        prostate ca   Neuropathy Mother  Heart disease Mother    Atrial fibrillation Mother    Heart failure Mother     Social History Social History   Tobacco Use   Smoking status: Former    Packs/day: 1.00    Years: 30.00    Pack years: 30.00    Types: Cigarettes    Quit date: 09/06/2010    Years since quitting: 10.5   Smokeless tobacco: Never  Substance Use Topics   Alcohol use: No    Comment: heavy drinker in the past   Drug use: No     Allergies   Cefazolin, Lipitor [atorvastatin], Rifampin, and Penicillins   Review of Systems As stated in HPI otherwise negative   Physical Exam Triage Vital  Signs ED Triage Vitals  Enc Vitals Group     BP 03/29/21 1427 119/76     Pulse Rate 03/29/21 1427 96     Resp --      Temp 03/29/21 1427 98 F (36.7 C)     Temp Source 03/29/21 1427 Oral     SpO2 03/29/21 1427 95 %     Weight --      Height --      Head Circumference --      Peak Flow --      Pain Score 03/29/21 1428 0     Pain Loc --      Pain Edu? --      Excl. in [place]? --    No data found.  Updated Vital Signs BP 119/76 (BP Location: Left Arm)   Pulse 96   Temp 98 F (36.7 C) (Oral)   SpO2 95%   Visual Acuity Right Eye Distance:   Left Eye Distance:   Bilateral Distance:    Right Eye Near:   Left Eye Near:    Bilateral Near:     Physical Exam Constitutional:      General: He is not in acute distress.    Appearance: Normal appearance. He is not ill-appearing or toxic-appearing.  HENT:     Right Ear: There is impacted cerumen.     Left Ear: There is impacted cerumen.     Ears:     Comments: Post irrigation, canals normal, TMs normal without erythema or exudate Skin:    General: Skin is warm and dry.  Neurological:     General: No focal deficit present.     Mental Status: He is alert and oriented to person, place, and time.  Psychiatric:        Mood and Affect: Mood normal.        Behavior: Behavior normal.     UC Treatments / Results  Labs (all labs ordered are listed, but only abnormal results are displayed) Labs Reviewed - No data to display  EKG   Radiology No results found.  Procedures Procedures (including critical care time)  Medications Ordered in UC Medications - No data to display  Initial Impression / Assessment and Plan / UC Course  I have reviewed the triage vital signs and the nursing notes.  Pertinent labs & imaging results that were available during my care of the patient were reviewed by me and considered in my medical decision making (see chart for details).  Cerumen impaction, bilateral -s/p irrigation with normal exam  post irrigation -Debrox prn  Reviewed expections re: course of current medical issues. Questions answered. Outlined signs and symptoms indicating need for more acute intervention. Pt verbalized understanding. AVS given  Final Clinical Impressions(s) / UC Diagnoses  Final diagnoses:  Bilateral impacted cerumen     Discharge Instructions      You can use over-the-counter Debrox to help with wax buildup in the future.  Please return if you need anything     ED Prescriptions   None    PDMP not reviewed this encounter.   Rudolpho Sevin, NP 03/29/21 1504

## 2021-03-29 NOTE — ED Triage Notes (Signed)
Patient c/o left ear blockage for about 10 days.  Patient does have decreased hearing in that ear.  The ear is popping.

## 2021-03-29 NOTE — Discharge Instructions (Signed)
You can use over-the-counter Debrox to help with wax buildup in the future.  Please return if you need anything

## 2021-06-24 DIAGNOSIS — M25511 Pain in right shoulder: Secondary | ICD-10-CM | POA: Diagnosis not present

## 2021-06-24 DIAGNOSIS — Z9889 Other specified postprocedural states: Secondary | ICD-10-CM | POA: Diagnosis not present

## 2021-06-24 DIAGNOSIS — D693 Immune thrombocytopenic purpura: Secondary | ICD-10-CM | POA: Diagnosis not present

## 2021-06-24 DIAGNOSIS — N032 Chronic nephritic syndrome with diffuse membranous glomerulonephritis: Secondary | ICD-10-CM | POA: Diagnosis not present

## 2021-07-14 DIAGNOSIS — M25511 Pain in right shoulder: Secondary | ICD-10-CM | POA: Diagnosis not present

## 2021-07-14 DIAGNOSIS — M19011 Primary osteoarthritis, right shoulder: Secondary | ICD-10-CM | POA: Diagnosis not present

## 2022-04-15 ENCOUNTER — Other Ambulatory Visit: Payer: Self-pay | Admitting: Orthopedic Surgery

## 2022-04-15 DIAGNOSIS — M25511 Pain in right shoulder: Secondary | ICD-10-CM

## 2022-05-13 ENCOUNTER — Ambulatory Visit
Admission: RE | Admit: 2022-05-13 | Discharge: 2022-05-13 | Disposition: A | Discharge status: Home / Self Care | Payer: PPO | Source: Ambulatory Visit | Attending: Orthopedic Surgery | Admitting: Orthopedic Surgery

## 2022-05-13 DIAGNOSIS — M25511 Pain in right shoulder: Secondary | ICD-10-CM

## 2022-07-16 ENCOUNTER — Ambulatory Visit
Admission: EM | Admit: 2022-07-16 | Discharge: 2022-07-16 | Disposition: A | Discharge status: Home / Self Care | Payer: PPO | Attending: Family Medicine | Admitting: Family Medicine

## 2022-07-16 DIAGNOSIS — H6122 Impacted cerumen, left ear: Secondary | ICD-10-CM

## 2022-07-16 NOTE — ED Triage Notes (Signed)
Pt presents with left ear fullness X 3 days.

## 2022-07-16 NOTE — ED Provider Notes (Signed)
[facility]    CSN: [id number] Arrival date & time: 07/16/22  9470      History   Chief Complaint Chief Complaint  Patient presents with   Ear Fullness    HPI Thomas Nielsen is a 66 y.o. male.    Ear Fullness   Here was feeling like his left ear is blocked and stopped up in the last 3 or 4 days.  No ear pain and no fever.  About 2 weeks ago he had an upper respiratory infection that is now resolved.  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    Patient Active Problem List   Diagnosis Date Noted   Osteomyelitis of cervical spine ([facility])    Eczema    Drug rash    Insomnia    Sinus tachycardia    S/P cervical spinal fusion 12/23/2016   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   Surgery, elective    Staph aureus infection    Nephrotic syndrome    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 ([place])    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   Anemia, unspecified 09/06/2013   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   HTN (hypertension)    Hyperlipidemia    ITP (idiopathic thrombocytopenic purpura)    Stage 3 chronic kidney disease ([facility])     Past Surgical History:  Procedure Laterality Date   ANTERIOR CERVICAL DECOMPRESSION FOR EPIDURAL ABSCESS N/A 11/17/2016   Procedure: ANTERIOR CERVICAL DECOMPRESSION FOR EPIDURAL ABSCESS CERVICAL SIX-SEVEN;  Surgeon: Eustace Moore, MD;  Location: [place];  Service: Neurosurgery;  Laterality: N/A;   CERVICAL WOUND DEBRIDEMENT N/A 11/26/2016   Procedure: CERVICAL WOUND EXPLORATION, IRRIGATION DEBRIDEMENT;  Surgeon: Kevan Ny Ditty, MD;  Location: [place];  Service: Neurosurgery;  Laterality: N/A;   HERNIA REPAIR Right 2006/2007   inguinal (done  twice)   kidney biospy     POSTERIOR CERVICAL FUSION/FORAMINOTOMY N/A 12/23/2016   Procedure: Posterior Cervical Fusion with lateral mass fixation - Cervical Four - Thoracic Two;  Surgeon: Eustace Moore, MD;  Location: [place];  Service: Neurosurgery;  Laterality: N/A;   TEE WITHOUT CARDIOVERSION N/A 11/29/2016   Procedure: TRANSESOPHAGEAL ECHOCARDIOGRAM (TEE);  Surgeon: Skeet Latch, MD;  Location[facility];  Service: Cardiovascular;  Laterality: N/A;       Home Medications    Prior to Admission medications   Medication Sig Start Date End Date Taking? Authorizing Provider  Ascorbic Acid (VITAMIN C) 1000 MG tablet Take 1,000 mg by mouth daily.    [provider]  calcium carbonate (OS-CAL) 600 MG TABS Take 600 mg by mouth daily.    [provider]  enalapril (VASOTEC) 10 MG tablet Take 10 mg by mouth daily.  12/02/11   [provider]    Family History Family History  Problem Relation Age of Onset   Cancer Father        prostate ca   Neuropathy Mother    Heart disease Mother    Atrial fibrillation Mother    Heart failure Mother     Social History Social History   Tobacco Use   Smoking status: Former    Packs/day: 1.00    Years: 30.00  Total pack years: 30.00    Types: Cigarettes    Quit date: 09/06/2010    Years since quitting: 11.8   Smokeless tobacco: Never  Substance Use Topics   Alcohol use: No    Comment: heavy drinker in the past   Drug use: No     Allergies   Cefazolin, Lipitor [atorvastatin], Rifampin, and Penicillins   Review of Systems Review of Systems   Physical Exam Triage Vital Signs ED Triage Vitals [07/16/22 1116]  Enc Vitals Group     BP (!) 141/89     Pulse Rate 74     Resp 17     Temp 98 F (36.7 C)     Temp Source Oral     SpO2 95 %     Weight      Height      Head Circumference      Peak Flow      Pain Score 0     Pain Loc      Pain Edu?      Excl. in [place]?    No data found.  Updated Vital  Signs BP (!) 141/89 (BP Location: Left Arm)   Pulse 74   Temp 98 F (36.7 C) (Oral)   Resp 17   SpO2 95%   Visual Acuity Right Eye Distance:   Left Eye Distance:   Bilateral Distance:    Right Eye Near:   Left Eye Near:    Bilateral Near:     Physical Exam Vitals reviewed.  Constitutional:      General: He is not in acute distress.    Appearance: He is not ill-appearing, toxic-appearing or diaphoretic.  HENT:     Right Ear: Tympanic membrane normal.     Ears:     Comments: Left tympanic membrane is obscured by orange cerumen. Skin:    Coloration: Skin is not pale.  Neurological:     General: No focal deficit present.     Mental Status: He is alert and oriented to person, place, and time.  Psychiatric:        Behavior: Behavior normal.      UC Treatments / Results  Labs (all labs ordered are listed, but only abnormal results are displayed) Labs Reviewed - No data to display  EKG   Radiology No results found.  Procedures Procedures (including critical care time)  Medications Ordered in UC Medications - No data to display  Initial Impression / Assessment and Plan / UC Course  I have reviewed the triage vital signs and the nursing notes.  Pertinent labs & imaging results that were available during my care of the patient were reviewed by me and considered in my medical decision making (see chart for details).        After lavage is done by nursing staff of the left ear, the tympanic membrane is easily visible and normal.  The canal is also normal. Final Clinical Impressions(s) / UC Diagnoses   Final diagnoses:  Impacted cerumen of left ear     Discharge Instructions      Your ear looks good after the earwax was washed out     ED Prescriptions   None    PDMP not reviewed this encounter.   Barrett Henle, MD 07/16/22 [phone]

## 2022-07-16 NOTE — Discharge Instructions (Addendum)
Your ear looks good after the earwax was washed out

## 2023-07-06 ENCOUNTER — Other Ambulatory Visit: Payer: Self-pay

## 2023-07-06 ENCOUNTER — Ambulatory Visit
Admission: EM | Admit: 2023-07-06 | Discharge: 2023-07-06 | Disposition: A | Discharge status: Home / Self Care | Payer: Self-pay | Attending: Family Medicine | Admitting: Family Medicine

## 2023-07-06 DIAGNOSIS — T2691XA Corrosion of right eye and adnexa, part unspecified, initial encounter: Secondary | ICD-10-CM

## 2023-07-06 NOTE — Discharge Instructions (Signed)
You need to see an eye specialist today.  I have contacted the eye doctor on-call for [facility] Dr. Vonna Kotyk [address] [facility] [address] [facility] [place] [address] He states that you must come for evaluation today.  Follow-up can be in [place]
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
## 2023-11-07 ENCOUNTER — Ambulatory Visit
Admission: EM | Admit: 2023-11-07 | Discharge: 2023-11-07 | Disposition: A | Discharge status: Home / Self Care | Payer: PPO

## 2023-11-07 ENCOUNTER — Encounter: Payer: Self-pay | Admitting: Emergency Medicine

## 2023-11-07 DIAGNOSIS — M109 Gout, unspecified: Secondary | ICD-10-CM

## 2023-11-07 DIAGNOSIS — J22 Unspecified acute lower respiratory infection: Secondary | ICD-10-CM

## 2023-11-07 MED ORDER — AZITHROMYCIN 250 MG PO TABS: ORAL_TABLET | ORAL | 0 refills | Status: AC

## 2023-11-07 MED ORDER — PROMETHAZINE-DM 6.25-15 MG/5ML PO SYRP: 5.0000 mL | ORAL_SOLUTION | Freq: Three times a day (TID) | ORAL | 0 refills | Status: AC | PRN

## 2023-11-07 MED ORDER — PREDNISONE 20 MG PO TABS: 40.0000 mg | ORAL_TABLET | Freq: Two times a day (BID) | ORAL | 0 refills | Status: AC

## 2023-11-07 NOTE — ED Provider Notes (Signed)
 [facility]    CSN: [id number] Arrival date & time: 11/07/23  0844      History   Chief Complaint Chief Complaint  Patient presents with   URI   Foot Pain    HPI Thomas Nielsen is a 68 y.o. male.   Cough/Shortness of breath  Patient presents today with a greater than 1 week history of progressively worsening cough, sinus congestion and drainage.  He works part-time and reports he has been unable to work due to the severity of his symptoms.  He has mild shortness of breath but is unaware if he has been wheezing.  He endorses he has been coughing and cough is now productive.  He denies any known fever but has had chills.  He reports he is taken multiple over-the-counter medications over the last several days and is concerned that this is caused his blood pressure to be elevated.  Patient is also mildly tachycardic on arrival and recheck heart rate remains between 100 and 110.  Since baseline heart rate is in the upper 90s.  Denies any chest pain, dizziness or weakness.  Right great toe With a history of recurrent gout presents today with a 1 day history of great right toe swelling, tenderness and redness.  He denies injury.  When he awakened today he noticed that his great toe remained painful but his entire foot is swollen.  He denies any pain swear involving the foot.  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    Patient Active Problem List   Diagnosis Date Noted   Osteomyelitis of cervical spine (HCC)    Eczema    Drug rash    Insomnia    Sinus tachycardia    S/P cervical spinal fusion  12/23/2016   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   Surgery, elective    Staph aureus infection    Nephrotic syndrome    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)    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   Anemia, unspecified 09/06/2013   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   HTN (hypertension)    Hyperlipidemia    Idiopathic thrombocytopenic purpura (HCC)    Stage 3 chronic kidney disease (HCC)     Past Surgical History:  Procedure Laterality Date   ANTERIOR CERVICAL DECOMPRESSION FOR EPIDURAL ABSCESS N/A 11/17/2016   Procedure: ANTERIOR CERVICAL DECOMPRESSION FOR EPIDURAL ABSCESS CERVICAL SIX-SEVEN;  Surgeon: Alm GORMAN Molt, MD;  Location: [facility] OR;  Service: Neurosurgery;  Laterality: N/A;   CERVICAL WOUND DEBRIDEMENT N/A 11/26/2016   Procedure: CERVICAL WOUND EXPLORATION, IRRIGATION DEBRIDEMENT;  Surgeon: Morene Hicks Ditty, MD;  Location: [facility] OR;  Service: Neurosurgery;  Laterality: N/A;   HERNIA REPAIR Right 2006/2007   inguinal (done twice)   kidney biospy     POSTERIOR CERVICAL FUSION/FORAMINOTOMY N/A 12/23/2016   Procedure: Posterior Cervical Fusion with lateral mass fixation - Cervical Four - Thoracic Two;  Surgeon: Alm GORMAN Molt, MD;  Location: [facility] OR;  Service: Neurosurgery;  Laterality: N/A;   TEE WITHOUT CARDIOVERSION N/A 11/29/2016   Procedure: TRANSESOPHAGEAL ECHOCARDIOGRAM (TEE);  Surgeon: Annabella Scarce, MD;  Location: [facility] ENDOSCOPY;  Service: Cardiovascular;  Laterality: N/A;       Home Medications    Prior to Admission medications   Medication Sig Start Date End Date Taking? Authorizing Provider  azithromycin  (ZITHROMAX ) 250 MG tablet Take 2 tabs PO x 1 dose, then 1 tab PO QD x 4 days 11/07/23  Yes Arloa Suzen RAMAN, NP  calcium  carbonate (OS-CAL) 600 MG TABS Take 600 mg by mouth daily.   Yes [provider]  enalapril  (VASOTEC ) 10 MG tablet Take 10 mg by mouth daily.  12/02/11  Yes [provider]  enalapril  (VASOTEC ) 20 MG tablet Take 20 mg by  mouth daily. 09/25/23  Yes [provider]  predniSONE  (DELTASONE ) 20 MG tablet Take 2 tablets (40 mg total) by mouth 2 (two) times daily with a meal for 5 days. 11/07/23 11/12/23 Yes Arloa Suzen RAMAN, NP  promethazine -dextromethorphan (PROMETHAZINE -DM) 6.25-15 MG/5ML syrup Take 5 mLs by mouth 3 (three) times daily as needed for cough. 11/07/23  Yes Arloa Suzen RAMAN, NP    Family History Family History  Problem Relation Age of Onset   Cancer Father        prostate ca   Neuropathy Mother    Heart disease Mother    Atrial fibrillation Mother    Heart failure Mother     Social History Social History   Tobacco Use   Smoking status: Former    Current packs/day: 0.00    Average packs/day: 1 pack/day for 30.0 years (30.0 ttl pk-yrs)    Types: Cigarettes    Start date: 09/06/1980    Quit date: 09/06/2010    Years since quitting: 13.1   Smokeless tobacco: Never  Vaping Use   Vaping status: Never Used  Substance Use Topics   Alcohol use: No    Comment: heavy drinker in the past   Drug use: No     Allergies   Cefazolin , Penicillins, Lipitor [atorvastatin], and Rifampin    Review of Systems Review of Systems Pertinent negatives listed in HPI  Physical Exam Triage Vital Signs ED Triage Vitals  Encounter Vitals Group     BP 11/07/23 1105 (!) 154/92     Systolic BP Percentile --      Diastolic BP Percentile --      Pulse Rate 11/07/23 1105 (!) 107     Resp 11/07/23 1105 20     Temp 11/07/23 1105 97.9 F (36.6 C)     Temp src --      SpO2 11/07/23 1105 97 %     Weight 11/07/23 1102 175 lb 0.7 oz (79.4 kg)     Height 11/07/23 1102 5' 11 (1.803 m)     Head Circumference --      Peak Flow --      Pain Score 11/07/23 1102 5     Pain Loc --      Pain Education --      Exclude from Growth Chart --    No data found.  Updated Vital Signs BP (!) 154/92 (BP Location: Left Arm)   Pulse (!) 107   Temp 97.9 F (36.6 C)   Resp 20   Ht 5' 11 (1.803 m)   Wt 175 lb 0.7  oz (79.4 kg)   SpO2 97%   BMI 24.41 kg/m   Visual Acuity Right Eye Distance:   Left Eye Distance:   Bilateral Distance:    Right Eye Near:   Left Eye Near:    Bilateral Near:  Physical Exam Constitutional:      Appearance: Normal appearance.  HENT:     Head: Normocephalic and atraumatic.     Mouth/Throat:     Mouth: Mucous membranes are dry.  Eyes:     Extraocular Movements: Extraocular movements intact.     Conjunctiva/sclera: Conjunctivae normal.     Pupils: Pupils are equal, round, and reactive to light.  Cardiovascular:     Rate and Rhythm: Regular rhythm. Tachycardia present.  Pulmonary:     Effort: Pulmonary effort is normal.     Breath sounds: Normal breath sounds.  Musculoskeletal:     Cervical back: Normal range of motion and neck supple.  Neurological:     General: No focal deficit present.     Mental Status: He is alert and oriented to person, place, and time.      UC Treatments / Results  Labs (all labs ordered are listed, but only abnormal results are displayed) Labs Reviewed - No data to display  EKG   Radiology No results found.  Procedures Procedures (including critical care time)  Medications Ordered in UC Medications - No data to display  Initial Impression / Assessment and Plan / UC Course  I have reviewed the triage vital signs and the nursing notes.  Pertinent labs & imaging results that were available during my care of the patient were reviewed by me and considered in my medical decision making (see chart for details).    Acute lower respiratory tract infection, covering with azithromycin  which will cover for most atypical 36-year-old respiratory infections.  Prednisone  20 mg daily prescribed ID for 5 days.  This MDM for cough.  Encouraged if his symptoms have not progressively improved over the next 5 days to return for evaluation. Not available at site therefore deferred Final Clinical Impressions(s) / UC Diagnoses   Final  diagnoses:  Acute lower respiratory tract infection  Gouty arthritis of right great toe     Discharge Instructions      Treat you for a lower respiratory tract infection.  You have been prescribed azithromycin  which is the antibiotic started today take as directed.  I have also prescribed you prednisone  to help improve your chest congestion and cough in addition to the gout in your foot.  Mepazine DM for cough take as directed. If your symptoms are not improving within the next 5 days return for evaluation.     ED Prescriptions     Medication Sig Dispense Auth. Provider   predniSONE  (DELTASONE ) 20 MG tablet Take 2 tablets (40 mg total) by mouth 2 (two) times daily with a meal for 5 days. 20 tablet Arloa Suzen RAMAN, NP   azithromycin  (ZITHROMAX ) 250 MG tablet Take 2 tabs PO x 1 dose, then 1 tab PO QD x 4 days 6 tablet Arloa Suzen RAMAN, NP   promethazine -dextromethorphan (PROMETHAZINE -DM) 6.25-15 MG/5ML syrup Take 5 mLs by mouth 3 (three) times daily as needed for cough. 240 mL Arloa Suzen RAMAN, NP      PDMP not reviewed this encounter.   Arloa Suzen RAMAN, NP 11/07/23 1159

## 2023-11-07 NOTE — Discharge Instructions (Signed)
 Treat you for a lower respiratory tract infection.  You have been prescribed azithromycin  which is the antibiotic started today take as directed.  I have also prescribed you prednisone  to help improve your chest congestion and cough in addition to the gout in your foot.  Mepazine DM for cough take as directed. If your symptoms are not improving within the next 5 days return for evaluation.

## 2023-11-07 NOTE — ED Triage Notes (Signed)
 Pt presents with URI and foot pain. URI symptoms onset 10/31/23. Sore throat was first symptom on Tuesday. Then pt was hoarse on Wednesday. Cough started last Thursday. Pt states now the cough is still lingering around and causes him not to be able to sleep.    Pt believes foot pain is related to gout.

## 2023-12-18 ENCOUNTER — Other Ambulatory Visit: Payer: Self-pay

## 2023-12-18 ENCOUNTER — Ambulatory Visit: Admission: EM | Admit: 2023-12-18 | Discharge: 2023-12-18 | Disposition: A | Discharge status: Home / Self Care

## 2023-12-18 ENCOUNTER — Encounter: Payer: Self-pay | Admitting: *Deleted

## 2023-12-18 DIAGNOSIS — H6123 Impacted cerumen, bilateral: Secondary | ICD-10-CM | POA: Diagnosis not present

## 2023-12-18 MED ORDER — CARBAMIDE PEROXIDE 6.5 % OT SOLN: OTIC | 0 refills | Status: AC

## 2023-12-18 NOTE — ED Triage Notes (Signed)
 Pt reports left ear fullness, decreased hearing x 1 week. States he has had a cold recently

## 2023-12-18 NOTE — Discharge Instructions (Addendum)
 Instructions given for home care to prevent wax buildup are given.

## 2023-12-18 NOTE — ED Provider Notes (Signed)
 [facility]    CSN: [id number] Arrival date & time: 12/18/23  1426      History   Chief Complaint Chief Complaint  Patient presents with   Ear Fullness    HPI Thomas Nielsen is a 68 y.o. male.   68 year old male pt, Thomas Nielsen, to urgent care for evaluation of left ear fullness decreased hearing x 1 week.  Patient states he has had a cold recently  PMH: CKD, anemia, HTN, Hyperlipidemia, ITP  The history is provided by the patient. No language interpreter was used.    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   Surgery, elective    Staph aureus infection    Nephrotic syndrome    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)    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   Anemia, unspecified 09/06/2013   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   HTN (hypertension)    Hyperlipidemia    Idiopathic thrombocytopenic purpura (HCC)    Stage 3 chronic kidney disease (HCC)     Past Surgical History:  Procedure Laterality Date   ANTERIOR CERVICAL DECOMPRESSION FOR EPIDURAL ABSCESS N/A 11/17/2016   Procedure: ANTERIOR CERVICAL DECOMPRESSION FOR EPIDURAL ABSCESS CERVICAL SIX-SEVEN;  Surgeon: Tia Alert, MD;  Location: [facility];  Service: Neurosurgery;  Laterality: N/A;   CERVICAL WOUND DEBRIDEMENT N/A 11/26/2016   Procedure: CERVICAL WOUND EXPLORATION, IRRIGATION DEBRIDEMENT;  Surgeon: Loura Halt Ditty, MD;   Location: [facility] OR;  Service: Neurosurgery;  Laterality: N/A;   HERNIA REPAIR Right 2006/2007   inguinal (done twice)   kidney biospy     POSTERIOR CERVICAL FUSION/FORAMINOTOMY N/A 12/23/2016   Procedure: Posterior Cervical Fusion with lateral mass fixation - Cervical Four - Thoracic Two;  Surgeon: Tia Alert, MD;  Location: [facility] OR;  Service: Neurosurgery;  Laterality: N/A;   TEE WITHOUT CARDIOVERSION N/A 11/29/2016   Procedure: TRANSESOPHAGEAL ECHOCARDIOGRAM (TEE);  Surgeon: Chilton Si, MD;  Location: [facility] ENDOSCOPY;  Service: Cardiovascular;  Laterality: N/A;       Home Medications    Prior to Admission medications   Medication Sig Start Date End Date Taking? Authorizing Provider  calcium carbonate (OS-CAL) 600 MG TABS Take 600 mg by mouth daily.   Yes [provider]  carbamide peroxide (DEBROX) 6.5 % OTIC solution Use as label directed both ears 12/18/23  Yes Kaydi Kley, Para March, NP  enalapril (VASOTEC) 10 MG tablet Take 10 mg by mouth daily.  12/02/11  Yes [provider]  predniSONE (DELTASONE) 5 MG tablet Take 5 mg by mouth daily with breakfast.   Yes [provider]  azithromycin (ZITHROMAX) 250 MG tablet Take 2 tabs PO x 1 dose, then 1 tab PO QD x 4 days 11/07/23   Bing Neighbors, NP  enalapril (VASOTEC) 20 MG tablet Take 20 mg by mouth daily. 09/25/23   [provider]  promethazine-dextromethorphan (PROMETHAZINE-DM) 6.25-15 MG/5ML syrup Take 5 mLs by mouth 3 (three) times daily as needed for cough. 11/07/23   Bing Neighbors, NP    Family History Family History  Problem Relation Age of Onset   Cancer Father        prostate ca   Neuropathy Mother    Heart disease Mother    Atrial fibrillation Mother    Heart failure Mother     Social History Social History   Tobacco Use   Smoking status: Former    Current packs/day: 0.00    Average packs/day: 1 pack/day for 30.0 years (30.0 ttl pk-yrs)    Types: Cigarettes    Start date:  09/06/1980    Quit date: 09/06/2010    Years since quitting: 13.2   Smokeless tobacco: Never  Vaping Use   Vaping status: Never Used  Substance Use Topics   Alcohol use: No    Comment: heavy drinker in the past   Drug use: No     Allergies   Cefazolin, Penicillins, Lipitor [atorvastatin], and Rifampin   Review of Systems Review of Systems  Constitutional:  Negative for fever.  HENT:  Negative for congestion, ear discharge and ear pain.        Ear fullness   All other systems reviewed and are negative.    Physical Exam Triage Vital Signs ED Triage Vitals [12/18/23 1458]  Encounter Vitals Group     BP      Systolic BP Percentile      Diastolic BP Percentile      Pulse      Resp      Temp      Temp src      SpO2      Weight      Height      Head Circumference      Peak Flow      Pain Score 4     Pain Loc      Pain Education      Exclude from Growth Chart    No data found.  Updated Vital Signs BP (!) 144/80 (BP Location: Left Arm)   Pulse 85   Temp 97.8 F (36.6 C) (Oral)   Resp 18   SpO2 96%   Visual Acuity Right Eye Distance:   Left Eye Distance:   Bilateral Distance:    Right Eye Near:   Left Eye Near:    Bilateral Near:     Physical Exam Vitals and nursing note reviewed.  Constitutional:      General: He is not in acute distress.    Appearance: He is well-developed and well-groomed.  HENT:     Head: Normocephalic and atraumatic.     Right Ear: Tympanic membrane normal. There is impacted cerumen.     Left Ear: Tympanic membrane normal. There is impacted cerumen.     Ears:     Comments: Ceruminosis is noted.  Wax is removed by syringe/irrigation,  Eyes:     Conjunctiva/sclera: Conjunctivae normal.  Cardiovascular:     Rate and Rhythm: Normal rate and regular rhythm.     Heart sounds: No murmur heard. Pulmonary:     Effort: Pulmonary effort is normal. No respiratory distress.  Abdominal:     Palpations: Abdomen is soft.     Tenderness:  There is no abdominal tenderness.  Musculoskeletal:        General: No swelling.     Cervical back: Neck supple.  Skin:  General: Skin is warm and dry.     Capillary Refill: Capillary refill takes less than 2 seconds.  Neurological:     General: No focal deficit present.     Mental Status: He is alert and oriented to person, place, and time.  Psychiatric:        Attention and Perception: Attention normal.        Mood and Affect: Mood normal.        Speech: Speech normal.        Behavior: Behavior normal. Behavior is cooperative.      UC Treatments / Results  Labs (all labs ordered are listed, but only abnormal results are displayed) Labs Reviewed - No data to display  EKG   Radiology No results found.  Procedures Procedures (including critical care time)  Medications Ordered in UC Medications - No data to display  Initial Impression / Assessment and Plan / UC Course  I have reviewed the triage vital signs and the nursing notes.  Pertinent labs & imaging results that were available during my care of the patient were reviewed by me and considered in my medical decision making (see chart for details).  Clinical Course as of 12/18/23 1654  Mon Dec 18, 2023  1553 TM intact pre/post cerumen removal [JD]    Clinical Course User Index [JD] Shanon Becvar, Para March, NP    Ddx: Bilateral cerumen impaction Final Clinical Impressions(s) / UC Diagnoses   Final diagnoses:  Bilateral impacted cerumen     Discharge Instructions      Instructions given for home care to prevent wax buildup are given.     ED Prescriptions     Medication Sig Dispense Auth. Provider   carbamide peroxide (DEBROX) 6.5 % OTIC solution Use as label directed both ears 15 mL Khalie Wince, Para March, NP      PDMP not reviewed this encounter.   Clancy Gourd, NP 12/18/23 1654

## 2024-11-05 ENCOUNTER — Other Ambulatory Visit: Payer: Self-pay | Admitting: Urology

## 2024-11-05 DIAGNOSIS — R972 Elevated prostate specific antigen [PSA]: Secondary | ICD-10-CM

## 2024-12-25 ENCOUNTER — Other Ambulatory Visit
